# Patient Record
Sex: Female | Born: 1945
Health system: Southern US, Community
[De-identification: ages and names within clinical notes are randomized; demographics above are authoritative.]

## PROBLEM LIST (undated history)

## (undated) DIAGNOSIS — E7800 Pure hypercholesterolemia, unspecified: Secondary | ICD-10-CM

## (undated) DIAGNOSIS — R7309 Other abnormal glucose: Secondary | ICD-10-CM

## (undated) DIAGNOSIS — I10 Essential (primary) hypertension: Secondary | ICD-10-CM

## (undated) DIAGNOSIS — R0989 Other specified symptoms and signs involving the circulatory and respiratory systems: Secondary | ICD-10-CM

## (undated) DIAGNOSIS — T7840XA Allergy, unspecified, initial encounter: Secondary | ICD-10-CM

## (undated) DIAGNOSIS — G250 Essential tremor: Secondary | ICD-10-CM

## (undated) DIAGNOSIS — R7303 Prediabetes: Secondary | ICD-10-CM

## (undated) DIAGNOSIS — K219 Gastro-esophageal reflux disease without esophagitis: Secondary | ICD-10-CM

## (undated) DIAGNOSIS — E782 Mixed hyperlipidemia: Secondary | ICD-10-CM

## (undated) DIAGNOSIS — R011 Cardiac murmur, unspecified: Secondary | ICD-10-CM

## (undated) HISTORY — DX: Other specified symptoms and signs involving the circulatory and respiratory systems: R09.89

## (undated) HISTORY — DX: Allergy, unspecified, initial encounter: T78.40XA

## (undated) HISTORY — PX: COLONOSCOPY: SHX174

## (undated) HISTORY — DX: Prediabetes: R73.03

## (undated) HISTORY — DX: Mixed hyperlipidemia: E78.2

## (undated) HISTORY — PX: UPPER GASTROINTESTINAL ENDOSCOPY: SHX188

## (undated) HISTORY — DX: Other abnormal glucose: R73.09

## (undated) HISTORY — PX: BACK SURGERY: SHX140

---
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
## 1967-03-06 HISTORY — PX: WISDOM TOOTH EXTRACTION: SHX21

## 1997-12-13 ENCOUNTER — Encounter: Payer: Self-pay | Admitting: Gastroenterology

## 1997-12-13 ENCOUNTER — Ambulatory Visit (HOSPITAL_COMMUNITY): Admission: RE | Admit: 1997-12-13 | Discharge: 1997-12-13 | Payer: Self-pay | Admitting: Internal Medicine

## 1999-03-06 HISTORY — PX: BACK SURGERY: SHX140

## 1999-07-20 ENCOUNTER — Encounter: Payer: Self-pay | Admitting: Neurological Surgery

## 1999-07-24 ENCOUNTER — Encounter: Payer: Self-pay | Admitting: Neurological Surgery

## 1999-07-24 ENCOUNTER — Inpatient Hospital Stay (HOSPITAL_COMMUNITY): Admission: RE | Admit: 1999-07-24 | Discharge: 1999-07-25 | Payer: Self-pay | Admitting: Neurological Surgery

## 1999-08-11 ENCOUNTER — Encounter: Payer: Self-pay | Admitting: Neurological Surgery

## 1999-08-11 ENCOUNTER — Encounter: Admission: RE | Admit: 1999-08-11 | Discharge: 1999-08-11 | Payer: Self-pay | Admitting: Neurological Surgery

## 1999-10-18 ENCOUNTER — Encounter: Admission: RE | Admit: 1999-10-18 | Discharge: 1999-10-18 | Payer: Self-pay | Admitting: Neurological Surgery

## 1999-10-18 ENCOUNTER — Encounter: Payer: Self-pay | Admitting: Neurological Surgery

## 1999-10-24 ENCOUNTER — Encounter: Admission: RE | Admit: 1999-10-24 | Discharge: 1999-12-15 | Payer: Self-pay | Admitting: Neurological Surgery

## 1999-12-12 ENCOUNTER — Encounter: Payer: Self-pay | Admitting: Neurological Surgery

## 1999-12-12 ENCOUNTER — Encounter: Admission: RE | Admit: 1999-12-12 | Discharge: 1999-12-12 | Payer: Self-pay | Admitting: Neurological Surgery

## 1999-12-21 ENCOUNTER — Encounter: Admission: RE | Admit: 1999-12-21 | Discharge: 2000-01-15 | Payer: Self-pay | Admitting: Neurological Surgery

## 2000-03-06 ENCOUNTER — Encounter: Admission: RE | Admit: 2000-03-06 | Discharge: 2000-05-03 | Payer: Self-pay | Admitting: Neurological Surgery

## 2000-07-10 ENCOUNTER — Encounter: Admission: RE | Admit: 2000-07-10 | Discharge: 2000-07-10 | Payer: Self-pay | Admitting: Neurological Surgery

## 2000-07-10 ENCOUNTER — Encounter: Payer: Self-pay | Admitting: Neurological Surgery

## 2000-10-21 ENCOUNTER — Other Ambulatory Visit: Admission: RE | Admit: 2000-10-21 | Discharge: 2000-10-21 | Payer: Self-pay | Admitting: *Deleted

## 2000-11-05 ENCOUNTER — Encounter: Admission: RE | Admit: 2000-11-05 | Discharge: 2000-11-05 | Payer: Self-pay | Admitting: Neurology

## 2000-11-05 ENCOUNTER — Encounter: Payer: Self-pay | Admitting: Neurology

## 2000-11-12 ENCOUNTER — Encounter: Admission: RE | Admit: 2000-11-12 | Discharge: 2001-02-10 | Payer: Self-pay | Admitting: *Deleted

## 2000-11-27 ENCOUNTER — Encounter (INDEPENDENT_AMBULATORY_CARE_PROVIDER_SITE_OTHER): Payer: Self-pay | Admitting: Specialist

## 2000-11-27 ENCOUNTER — Other Ambulatory Visit: Admission: RE | Admit: 2000-11-27 | Discharge: 2000-11-27 | Payer: Self-pay | Admitting: Gastroenterology

## 2002-01-14 ENCOUNTER — Other Ambulatory Visit: Admission: RE | Admit: 2002-01-14 | Discharge: 2002-01-14 | Payer: Self-pay | Admitting: Internal Medicine

## 2002-03-04 ENCOUNTER — Encounter: Payer: Self-pay | Admitting: Internal Medicine

## 2002-03-04 ENCOUNTER — Ambulatory Visit (HOSPITAL_COMMUNITY): Admission: RE | Admit: 2002-03-04 | Discharge: 2002-03-04 | Payer: Self-pay | Admitting: Internal Medicine

## 2003-02-24 ENCOUNTER — Other Ambulatory Visit: Admission: RE | Admit: 2003-02-24 | Discharge: 2003-02-24 | Payer: Self-pay | Admitting: Obstetrics and Gynecology

## 2004-04-06 ENCOUNTER — Other Ambulatory Visit: Admission: RE | Admit: 2004-04-06 | Discharge: 2004-04-06 | Payer: Self-pay | Admitting: Internal Medicine

## 2005-05-23 ENCOUNTER — Ambulatory Visit (HOSPITAL_COMMUNITY): Admission: RE | Admit: 2005-05-23 | Discharge: 2005-05-23 | Payer: Self-pay | Admitting: Internal Medicine

## 2005-07-23 ENCOUNTER — Encounter: Admission: RE | Admit: 2005-07-23 | Discharge: 2005-07-23 | Payer: Self-pay | Admitting: Neurology

## 2006-05-30 ENCOUNTER — Other Ambulatory Visit: Admission: RE | Admit: 2006-05-30 | Discharge: 2006-05-30 | Payer: Self-pay | Admitting: Internal Medicine

## 2006-06-17 ENCOUNTER — Ambulatory Visit: Payer: Self-pay | Admitting: Gastroenterology

## 2006-07-25 ENCOUNTER — Ambulatory Visit: Payer: Self-pay | Admitting: Gastroenterology

## 2006-07-25 ENCOUNTER — Encounter (INDEPENDENT_AMBULATORY_CARE_PROVIDER_SITE_OTHER): Payer: Self-pay | Admitting: Gastroenterology

## 2006-11-21 ENCOUNTER — Ambulatory Visit (HOSPITAL_COMMUNITY): Admission: RE | Admit: 2006-11-21 | Discharge: 2006-11-21 | Payer: Self-pay | Admitting: Internal Medicine

## 2008-01-20 ENCOUNTER — Ambulatory Visit: Payer: Self-pay

## 2008-03-15 ENCOUNTER — Other Ambulatory Visit: Admission: RE | Admit: 2008-03-15 | Discharge: 2008-03-15 | Payer: Self-pay | Admitting: Internal Medicine

## 2008-04-08 ENCOUNTER — Ambulatory Visit (HOSPITAL_COMMUNITY): Admission: RE | Admit: 2008-04-08 | Discharge: 2008-04-08 | Payer: Self-pay | Admitting: Internal Medicine

## 2010-03-27 ENCOUNTER — Encounter: Payer: Self-pay | Admitting: Internal Medicine

## 2010-07-21 NOTE — H&P (Signed)
[name]  Patient:    Margaret Sullivan, Margaret Sullivan                       MRN: [id number] Adm. Date:  [id number] Attending:  Clearnce Sorrel Dictator:   Earleen Newport, M.D.                         History and Physical  ADMISSION DIAGNOSES: 1. Herniated nucleus pulposus, C6-7, left. 2. Spondylosis, C5-6, right, with left C7 radiculopathy.  HISTORY OF PRESENT ILLNESS:  The patient is a 65 year old right-handed individual who is a retired Education officer, [employer].  She has had previous problems with pain in the neck and shoulder and right arm over a years period of time, and has had pain in the left side of the neck that became much more severe and acute about 10 weeks ago.  The patient noted that in 1999 she had some tremors in the left hand, however, about 9 weeks ago she developed pain radiating down into the hands, tingling into the fingertips, but no overt numbness was also reported.  She finds that mobility of her neck and arms has been significantly limited since the onset of this ______ type of pain, and it comes to the point where it has interrupted her ability to sleep and rest comfortably.  She feels something further needs to be done.  An MRI demonstrated the presence of spondylitic disease, eccentric to the right side at C5-6, herniated nucleus pulposus eccentric to the left side compressing the left C7 nerve root.  She was seen in the office on May 2, and surgery was advised.  She is now admitted for a two level anterior diskectomy and arthrodesis, having significant signs of both the right and left cervical radiculopathy.  PAST MEDICAL HISTORY: 1. Tonsillectomy in the past. 2. Wisdom teeth extraction in 1968. 3. Birth mark removal in 1977. 4. Endoscopy in 1990 and again in 1999. 5. Colonoscopy in 1990 and again in 1999. 6. Bladder procedure was complicated by significant reactions to medications    and several bouts of pneumonia that the patient  feels are secondary to    aspiration.  This ultimately forced her retirement, and she was sick for a    prolonged period of time with pneumonias.  She also noted that she had    significant medical allergies during this period of time.  ALLERGIES:  PENICILLIN, AMPICILLIN, ASPIRIN, MACRODANTIN, PERCOCET, TALWIN, HYDROCODONE, AND PROPULSID.  She was started on Propulsid after it was diagnosed that she had a hiatal hernia.  She was given this medication for reflux, and had a severe reaction that ended up in a 911 call.  The patient currently only uses Tylenol for pain.  She has significant concerns about undergoing any anesthetic procedure because of history of aspiration, and notes that she has quite severe anxiety episodes with fear of what happens to her breathing and her swallowing at night.  She has used some Xanax for this in the past.  SOCIAL HISTORY:  She does not smoke, she does not drink alcohol.  Her height and weight have been stable at 160 pounds, 5 feet 3.5 inches.  She is divorced.  She has no children.  She is a retired Education officer, museum, having taught in the system for 30 years.  FAMILY HISTORY:  Her mother died at age 67.  Father died at age 33 of a history of hypertension,  heart attacks, cancer, and nerve damage as a result of a war injury in her father.  No significant history of tremor, except for perhaps a grandparent which was suspected to have been exposed to mustard gas during the first World War.  There is a history of some arthritic conditions in her mom.  REVIEW OF SYSTEMS:  Notable for the wearing of glasses, high blood pressure, heart murmur, high cholesterol, sweating in the feet and hands, sinus problems, arm weakness, back pain, arm pain, joint pain and swelling, neck pain, and a history of pneumonias as previously noted.  PHYSICAL EXAMINATION:  GENERAL:  She is an alert, oriented, and cooperative individual in no overt distress.  NECK:  Range of  motion of her neck reveals that she turns to the right 60 degrees and turns to the left to 60 degrees.  She extends and flexes normally, and extends and flexes with significant pain.  EXTREMITIES:  Her motor strength in the upper extremities reveals the deltoids, biceps, and intrinsics have normal strength, tone, and bulk. Triceps and wrist extensor on the left side is markedly weak at 3/5 in the triceps and 4/5 in the finger extensors.  Sensation is intact to vibration in the distal upper extremities.  There is tenderness to palpation in the supraclavicular fossa worse on the left than on the right.  No masses are palpable in the neck.  No bruits are heard in the neck, however, grade 1/6 systolic murmur is heard at the base from her heart.  NEUROLOGIC:  Cranial nerve examination reveals the pupils are 4 mm, brisk, reactive to light and accommodation.  Extraocular movements are full.  The face is symmetric to grimmace.  Tongue and uvula are in the midline.  Station and gait are normal.  Fund of knowledge and flow of thoughts is normal.  LUNGS:  Clear to auscultation.  ABDOMEN:  Soft, protuberant, bowel sounds positive, no masses are noted.  EXTREMITIES:  No clubbing, cyanosis, or edema.  The patient has a herniated nucleus pulposus at C6-7 on the left side, and spondylitic disease that is most marked on the C5-6 side with evidence of cord compression.  PLAN:  She is now being admitted to undergo surgical decompression at the two levels. DD:  07/24/99 TD:  07/24/99 Job: [id number]

## 2010-07-21 NOTE — Op Note (Signed)
[place]. [facility]  Patient:    Margaret Sullivan, Margaret Sullivan                       MRN: [id number] Proc. Date: 07/24/99 Adm. Date:  [id number] Disc. Date: [id number] Attending:  Clearnce Sorrel                           Operative Report  PREOPERATIVE DIAGNOSIS:   C5-6 spondylosis with right cervical radiculopathy, C6-7 herniated nucleus pulposus with left cervical radiculopathy.  POSTOPERATIVE DIAGNOSIS:  C5-6 spondylosis with right cervical radiculopathy, C6-7 herniated nucleus pulposus with left cervical radiculopathy.  PROCEDURE:  Anterior cervical diskectomy and arthrodesis, C5-6 and C6-7.  SURGEON:  Earleen Newport, M.D.  FIRST ASSISTANT:  Madelon Lips. Quentin Cornwall, M.D.  ANESTHESIA:  General endotracheal.  INDICATIONS:  The patient is a 65 year old individual who has had significant neck, shoulder, and arm pain on the left side more acutely, on the right side chronically.  Spondylosis is noted at C5-6 on the right, and there are early signs of cord compression, and there is a herniated nucleus pulposus at C6-7 on the left.  DESCRIPTION OF PROCEDURE:  The patient was brought to the operating room and placed on the table in supine position.  After smooth induction of general endotracheal anesthesia, she was placed in five pounds of halter traction. The neck was shaved, prepped with Duraprep, and draped in a sterile fashion. A transverse incision was made in the left side of the neck and carried down through the platysma.  The plane between the sternocleidomastoid and the strap muscles was dissected bluntly until the prevertebral space was reached.  The first identifiable disk space was noted to be that of C5-6 on the radiograph. A diskectomy was then performed after placing a Caspar retractor and using a combination of curets and rongeurs, the disk space was opened.  Self-retaining disk spreader was placed in the wound, then a Midas Rex and A2 bur was used  to remove spurs from the inferior margin of the body of C5 and out to and including the uncinate process on the right.  Significant osteophytic material was encountered at C5-6.  This was decompressed.  Both lateral recesses were well-decompressed.  Hemostasis was achieved from the epidural veins in the area using a bipolar cautery and then some small pledgets of Gelfoam soaked in thrombin.  A round fibular graft was then packed with some of the bone that was removed from the osteophytes and placed into the interspace at C5-6.  At C6-7, a similar procedure was carried out.  Again a diskectomy was performed. Here a large lateral disk herniation in the free lateral space of C6-7 on the left was encountered.  Once this was resected and removed, the area was well-decompressed and attention was then turned to placing a graft, again a 7 mm round fibular graft packed with some of the patients own bone.  Traction was then removed.  The neck was placed in slight flexion.  The surfaces were instrumented with a 37 mm Synthes multi-angle plate with six locking 4 x 14 mm self-drilling, self-tapping screws.  Locking screws were placed.  The area was then checked for hemostasis and a localizing radiograph identified good position of the plate.  Once hemostasis was achieved, the platysma was closed with 2-0 Vicryl in interrupted fashion, 3-0 Vicryl was used in the subcuticular tissues.  A Tegaderm dressing was placed on  the patients wound. The patient tolerated the procedure well and was returned to the recovery room in stable condition. DD:  07/24/99 TD:  07/28/99 Job: [id number]

## 2010-07-21 NOTE — Discharge Summary (Signed)
[place]. [facility]  Patient:    Margaret Sullivan, Margaret Sullivan                       MRN: [id number] Adm. Date:  [id number] Disc. Date: [id number] Attending:  Clearnce Sorrel                           Discharge Summary  ADMITTING DIAGNOSIS:  Cervical spondylosis, C5-6, right, with right cervical radiculopathy, C6-7 left, with left cervical radiculopathy.  DISCHARGE DIAGNOSIS:  Cervical spondylosis, C5-6, right, with right cervical radiculopathy, C6-7 left, with left cervical radiculopathy.  OPERATION:  Anterior cervical diskectomy and fusion, C5-6 and C6-7, with allograft and Synthes fixation.  CONDITION ON DISCHARGE:  Improving.  HOSPITAL COURSE:  The patient is a 65 year old individual who has a number of known medical allergies.  She has had significant radiculopathy particularly in the left upper extremity secondary to a herniated nucleus pulposus at C6-7, but also chronic right C6 radiculopathy secondary to spondylosis.  She underwent anterior diskectomy and arthrodesis.  She tolerated the procedure well.  Her swelling has been uninvolved since the time of surgery and her breathing has been stable.  Patient had significant concerns because of a number of medical allergies.  She was premedicated with some Decadron which caused her to feel somewhat hyperactive during the postoperative period.  Her incision remains clean and dry and she has been advised as to her postoperative activity.  She is given a prescription for Xanax #30 for anxiety, 0.5 mg, without refills.  She will use Tylenol for pain. DD:  07/25/99 TD:  07/27/99 Job: [id number]

## 2011-05-02 ENCOUNTER — Other Ambulatory Visit: Payer: Self-pay | Admitting: Internal Medicine

## 2011-05-10 ENCOUNTER — Ambulatory Visit
Admission: RE | Admit: 2011-05-10 | Discharge: 2011-05-10 | Disposition: A | Discharge status: Home / Self Care | Payer: Medicare Other | Source: Ambulatory Visit | Attending: Internal Medicine | Admitting: Internal Medicine

## 2011-05-14 ENCOUNTER — Encounter: Payer: Self-pay | Admitting: Gastroenterology

## 2012-01-04 ENCOUNTER — Ambulatory Visit
Admission: RE | Admit: 2012-01-04 | Discharge: 2012-01-04 | Disposition: A | Discharge status: Home / Self Care | Payer: Medicare Other | Source: Ambulatory Visit | Attending: Family Medicine | Admitting: Family Medicine

## 2012-01-04 ENCOUNTER — Other Ambulatory Visit: Payer: Self-pay | Admitting: Family Medicine

## 2012-01-04 DIAGNOSIS — E049 Nontoxic goiter, unspecified: Secondary | ICD-10-CM

## 2012-06-10 ENCOUNTER — Encounter: Payer: Self-pay | Admitting: Gastroenterology

## 2012-12-16 ENCOUNTER — Other Ambulatory Visit (HOSPITAL_COMMUNITY): Payer: Self-pay | Admitting: Internal Medicine

## 2012-12-18 ENCOUNTER — Other Ambulatory Visit (HOSPITAL_COMMUNITY): Payer: Self-pay | Admitting: Internal Medicine

## 2012-12-18 DIAGNOSIS — K8020 Calculus of gallbladder without cholecystitis without obstruction: Secondary | ICD-10-CM

## 2012-12-31 ENCOUNTER — Encounter (HOSPITAL_COMMUNITY): Payer: Medicare Other

## 2013-06-09 ENCOUNTER — Encounter (HOSPITAL_COMMUNITY): Payer: Self-pay | Admitting: Emergency Medicine

## 2013-06-09 ENCOUNTER — Emergency Department (HOSPITAL_COMMUNITY)
Admission: EM | Admit: 2013-06-09 | Discharge: 2013-06-09 | Disposition: A | Discharge status: Home / Self Care | Payer: Medicare Other | Attending: Emergency Medicine | Admitting: Emergency Medicine

## 2013-06-09 DIAGNOSIS — R0989 Other specified symptoms and signs involving the circulatory and respiratory systems: Secondary | ICD-10-CM | POA: Insufficient documentation

## 2013-06-09 DIAGNOSIS — Z862 Personal history of diseases of the blood and blood-forming organs and certain disorders involving the immune mechanism: Secondary | ICD-10-CM | POA: Insufficient documentation

## 2013-06-09 DIAGNOSIS — K2100 Gastro-esophageal reflux disease with esophagitis, without bleeding: Secondary | ICD-10-CM | POA: Insufficient documentation

## 2013-06-09 DIAGNOSIS — Z8639 Personal history of other endocrine, nutritional and metabolic disease: Secondary | ICD-10-CM | POA: Insufficient documentation

## 2013-06-09 DIAGNOSIS — Z79899 Other long term (current) drug therapy: Secondary | ICD-10-CM | POA: Insufficient documentation

## 2013-06-09 DIAGNOSIS — Z8669 Personal history of other diseases of the nervous system and sense organs: Secondary | ICD-10-CM | POA: Insufficient documentation

## 2013-06-09 DIAGNOSIS — R7309 Other abnormal glucose: Secondary | ICD-10-CM | POA: Insufficient documentation

## 2013-06-09 DIAGNOSIS — Z880 Allergy status to penicillin: Secondary | ICD-10-CM | POA: Insufficient documentation

## 2013-06-09 DIAGNOSIS — I10 Essential (primary) hypertension: Secondary | ICD-10-CM | POA: Insufficient documentation

## 2013-06-09 DIAGNOSIS — E782 Mixed hyperlipidemia: Secondary | ICD-10-CM | POA: Insufficient documentation

## 2013-06-09 DIAGNOSIS — K047 Periapical abscess without sinus: Secondary | ICD-10-CM

## 2013-06-09 DIAGNOSIS — R011 Cardiac murmur, unspecified: Secondary | ICD-10-CM | POA: Insufficient documentation

## 2013-06-09 HISTORY — DX: Gastro-esophageal reflux disease without esophagitis: K21.9

## 2013-06-09 HISTORY — DX: Essential (primary) hypertension: I10

## 2013-06-09 HISTORY — DX: Pure hypercholesterolemia, unspecified: E78.00

## 2013-06-09 HISTORY — DX: Essential tremor: G25.0

## 2013-06-09 HISTORY — DX: Cardiac murmur, unspecified: R01.1

## 2013-06-09 MED ORDER — HYDROCODONE-ACETAMINOPHEN 5-325 MG PO TABS: 1.0000 | ORAL_TABLET | Freq: Four times a day (QID) | ORAL | Status: DC | PRN

## 2013-06-09 MED ORDER — ONDANSETRON HCL 4 MG PO TABS: 4.0000 mg | ORAL_TABLET | Freq: Four times a day (QID) | ORAL | Status: DC

## 2013-06-09 MED ORDER — ONDANSETRON HCL 4 MG/2ML IJ SOLN
4.0000 mg | Freq: Once | INTRAMUSCULAR | Status: AC
  Administered 2013-06-09: 4 mg via INTRAVENOUS
  Filled 2013-06-09 (×2): qty 2

## 2013-06-09 MED ORDER — ONDANSETRON HCL 4 MG/2ML IJ SOLN
4.0000 mg | Freq: Once | INTRAMUSCULAR | Status: AC
  Administered 2013-06-09: 4 mg via INTRAVENOUS
  Filled 2013-06-09: qty 2

## 2013-06-09 MED ORDER — CLINDAMYCIN PHOSPHATE 600 MG/50ML IV SOLN
600.0000 mg | Freq: Once | INTRAVENOUS | Status: AC
  Administered 2013-06-09: 600 mg via INTRAVENOUS
  Filled 2013-06-09: qty 50

## 2013-06-09 MED ORDER — FENTANYL CITRATE 0.05 MG/ML IJ SOLN
50.0000 ug | INTRAMUSCULAR | Status: DC | PRN
  Administered 2013-06-09 (×2): 50 ug via INTRAVENOUS
  Filled 2013-06-09 (×2): qty 2

## 2013-06-09 NOTE — ED Provider Notes (Signed)
CSN: [id number]     Arrival date & time 06/09/13  0134 History   First MD Initiated Contact with Patient 06/09/13 0503     Chief Complaint  Patient presents with  . Dental Pain     (Consider location/radiation/quality/duration/timing/severity/associated sxs/prior Treatment) HPI History provided by patient. Right upper molar pain and facial swelling. Onset a few days ago, saw her endodontist in the office for planned root canal R first upper molar. she was diagnosed with dental abscess. In the office, she was unable to tolerate lidocaine injection - she was shaking too much and had to reschedule procedure. She was discharged home with clindamycin. Today she developed worsening pain with facial swelling. No fevers or chills. No nausea vomiting. No difficulty swallowing or breathing. Taking Tylenol at home without relief.  Past Medical History  Diagnosis Date  . Hypertension   . GERD (gastroesophageal reflux disease)   . Essential tremor   . High cholesterol   . Heart murmur    Past Surgical History  Procedure Laterality Date  . Back surgery     No family history on file. History  Substance Use Topics  . Smoking status: Never Smoker   . Smokeless tobacco: Not on file  . Alcohol Use: No   OB History   Grav Para Term Preterm Abortions TAB SAB Ect Mult Living                 Review of Systems  Constitutional: Negative for fever and chills.  HENT: Positive for dental problem and facial swelling.   Respiratory: Negative for shortness of breath.   Cardiovascular: Negative for chest pain.  Gastrointestinal: Negative for abdominal pain.  Genitourinary: Negative for dysuria.  Musculoskeletal: Negative for back pain, neck pain and neck stiffness.  Skin: Negative for rash.  Neurological: Negative for headaches.  All other systems reviewed and are negative.      Allergies  Ampicillin; Aspirin; Doxycycline; Macrodantin; Propulsid; Voltaren; Codeine; and Penicillins  Home  Medications   Current Outpatient Rx  Name  Route  Sig  Dispense  Refill  . Cholecalciferol (VITAMIN D PO)   Oral   Take 1 tablet by mouth daily.         . Probiotic Product (PROBIOTIC PO)   Oral   Take 1 capsule by mouth 4 (four) times a week. Naturlen.         . clindamycin (CLEOCIN) 150 MG capsule   Oral   Take 150 mg by mouth every 6 (six) hours. 7 day therapy-Patient began on 06/07/2013.          BP 187/77  Pulse 96  Temp(Src) 98.8 F (37.1 C) (Oral)  SpO2 98% Physical Exam  Constitutional: She is oriented to person, place, and time. She appears well-developed and well-nourished.  HENT:  Head: Normocephalic and atraumatic.  Tender right upper first molar with associated facial swelling, erythema and increased warmth to touch. Mild trismus. Uvula midline.  Eyes: EOM are normal. Pupils are equal, round, and reactive to light.  Neck: Normal range of motion. Neck supple.  Cardiovascular: Regular rhythm and intact distal pulses.   Pulmonary/Chest: Effort normal. No stridor. No respiratory distress.  Musculoskeletal: Normal range of motion. She exhibits no edema.  Lymphadenopathy:    She has no cervical adenopathy.  Neurological: She is alert and oriented to person, place, and time.  Skin: Skin is warm and dry.    ED Course  Procedures (including critical care time)   Room air pulse ox 95%  is adequate  IV clindamycin IV fentanyl. Zofran.  On recheck her pain is significantly improved and she feels comfortable with plan discharge home. She will continue clindamycin as prescribed by her endodontist and keep her scheduled followup for her root canal. She will try prescription pain medications and antiemetic. She agrees to return precautions and all discharge and followup instructions. MDM   Final diagnoses:  Dental abscess   Treated with IV narcotics and IV antibiotics. Condition improving. No fever or airway compromise. No indication for emergent imaging. Vital  signs nursing notes reviewed and considered.    Teressa Lower, MD 06/10/13 [phone]

## 2013-06-09 NOTE — Discharge Instructions (Signed)
°  Dental Abscess A dental abscess is a collection of infected fluid (pus) from a bacterial infection in the inner part of the tooth (pulp). It usually occurs at the end of the tooth's root.  CAUSES   Severe tooth decay.  Trauma to the tooth that allows bacteria to enter into the pulp, such as a broken or chipped tooth. SYMPTOMS   Severe pain in and around the infected tooth.  Swelling and redness around the abscessed tooth or in the mouth or face.  Tenderness.  Pus drainage.  Bad breath.  Bitter taste in the mouth.  Difficulty swallowing.  Difficulty opening the mouth.  Nausea.  Vomiting.  Chills.  Swollen neck glands. DIAGNOSIS   A medical and dental history will be taken.  An examination will be performed by tapping on the abscessed tooth.  X-rays may be taken of the tooth to identify the abscess. TREATMENT The goal of treatment is to eliminate the infection. You may be prescribed antibiotic medicine to stop the infection from spreading. A root canal may be performed to save the tooth. If the tooth cannot be saved, it may be pulled (extracted) and the abscess may be drained.  HOME CARE INSTRUCTIONS  Only take over-the-counter or prescription medicines for pain, fever, or discomfort as directed by your caregiver.  Rinse your mouth (gargle) often with salt water ( tsp salt in 8 oz [250 ml] of warm water) to relieve pain or swelling.  Do not drive after taking pain medicine (narcotics).  Do not apply heat to the outside of your face.  Return to your dentist for further treatment as directed. SEEK MEDICAL CARE IF:  Your pain is not helped by medicine.  Your pain is getting worse instead of better. SEEK IMMEDIATE MEDICAL CARE IF:  You have a fever or persistent symptoms for more than 2 3 days.  You have a fever and your symptoms suddenly get worse.  You have chills or a very bad headache.  You have problems breathing or swallowing.  You have trouble  opening your mouth.  You have swelling in the neck or around the eye. Document Released: 02/19/2005 Document Revised: 11/14/2011 Document Reviewed: 05/30/2010 [facility] Patient Information 2014 [place], [place].

## 2013-06-09 NOTE — ED Notes (Signed)
Bed: WA20 Expected date:  Expected time:  Means of arrival:  Comments: 

## 2013-06-09 NOTE — ED Notes (Signed)
Pt states that she had dental pain, scheduled a root canal w/ her dentist and had to reschedule bc the epinephrine made her shake too much and her BP was too high. Taking antibiotics and tylenol for pain but tonight her R side of her face is swollen, her BP is high and she is having increased pain.

## 2013-06-10 ENCOUNTER — Ambulatory Visit (INDEPENDENT_AMBULATORY_CARE_PROVIDER_SITE_OTHER): Payer: Medicare Other | Admitting: Internal Medicine

## 2013-06-10 ENCOUNTER — Encounter: Payer: Self-pay | Admitting: Internal Medicine

## 2013-06-10 VITALS — BP 128/66 | HR 96 | Temp 97.2°F | Resp 16 | Ht 64.0 in | Wt 163.2 lb

## 2013-06-10 DIAGNOSIS — R7303 Prediabetes: Secondary | ICD-10-CM

## 2013-06-10 DIAGNOSIS — I10 Essential (primary) hypertension: Secondary | ICD-10-CM

## 2013-06-10 DIAGNOSIS — Z79899 Other long term (current) drug therapy: Secondary | ICD-10-CM | POA: Insufficient documentation

## 2013-06-10 DIAGNOSIS — R0989 Other specified symptoms and signs involving the circulatory and respiratory systems: Secondary | ICD-10-CM

## 2013-06-10 DIAGNOSIS — E559 Vitamin D deficiency, unspecified: Secondary | ICD-10-CM

## 2013-06-10 DIAGNOSIS — R7309 Other abnormal glucose: Secondary | ICD-10-CM

## 2013-06-10 DIAGNOSIS — E782 Mixed hyperlipidemia: Secondary | ICD-10-CM

## 2013-06-10 HISTORY — DX: Other long term (current) drug therapy: Z79.899

## 2013-06-10 LAB — HEPATIC FUNCTION PANEL
ALBUMIN: 4.5 g/dL (ref 3.5–5.2)
ALK PHOS: 68 U/L (ref 39–117)
ALT: 25 U/L (ref 0–35)
AST: 28 U/L (ref 0–37)
Bilirubin, Direct: 0.2 mg/dL (ref 0.0–0.3)
Indirect Bilirubin: 0.6 mg/dL (ref 0.2–1.2)
TOTAL PROTEIN: 7.4 g/dL (ref 6.0–8.3)
Total Bilirubin: 0.8 mg/dL (ref 0.2–1.2)

## 2013-06-10 LAB — CBC WITH DIFFERENTIAL/PLATELET
Basophils Absolute: 0 10*3/uL (ref 0.0–0.1)
Basophils Relative: 0 % (ref 0–1)
EOS PCT: 0 % (ref 0–5)
Eosinophils Absolute: 0 10*3/uL (ref 0.0–0.7)
HCT: 39.2 % (ref 36.0–46.0)
Hemoglobin: 13.6 g/dL (ref 12.0–15.0)
LYMPHS ABS: 1.3 10*3/uL (ref 0.7–4.0)
LYMPHS PCT: 10 % — AB (ref 12–46)
MCH: 30.6 pg (ref 26.0–34.0)
MCHC: 34.7 g/dL (ref 30.0–36.0)
MCV: 88.3 fL (ref 78.0–100.0)
Monocytes Absolute: 1.3 10*3/uL — ABNORMAL HIGH (ref 0.1–1.0)
Monocytes Relative: 10 % (ref 3–12)
Neutro Abs: 10.2 10*3/uL — ABNORMAL HIGH (ref 1.7–7.7)
Neutrophils Relative %: 80 % — ABNORMAL HIGH (ref 43–77)
Platelets: 204 10*3/uL (ref 150–400)
RBC: 4.44 MIL/uL (ref 3.87–5.11)
RDW: 13.1 % (ref 11.5–15.5)
WBC: 12.7 10*3/uL — AB (ref 4.0–10.5)

## 2013-06-10 MED ORDER — ALPRAZOLAM 0.25 MG PO TABS: ORAL_TABLET | ORAL | Status: DC

## 2013-06-10 NOTE — Patient Instructions (Signed)

## 2013-06-10 NOTE — Progress Notes (Signed)
Patient ID: Margaret Sullivan, female   DOB: [birth year], 68 y.o.   MRN: [id number]    This very nice 68 y.o. DWF presents for 3 month follow up with Hypertension, Hyperlipidemia, Pre-Diabetes and Vitamin D Deficiency.    Labile HTN predates many years. BP has been note moderately elevated in the range 200-210/ 100/110 associated with a dental abcess and monitored at home and at her dental office. Today's BP: 128/66 mmHg . Patient denies any cardiac type chest pain, palpitations, dyspnea/orthopnea/PND, dizziness, claudication, or dependent edema.   Hyperlipidemia is not controlled with diet & meds. Last Cholesterol was 251, Triglycerides were 188, HDL  36 and LDL 177 - not at goal and patient is adverse to taking meds for cholesterol despite recommendations otherwise. Patient denies myalgias or other med SE's.    Also, the patient has history of PreDiabetes with A1c 6.1% in Feb 2013 and with last A1c of 5.8% in Oct 2014. Patient denies any symptoms of reactive hypoglycemia, diabetic polys, paresthesias or visual blurring.   Further, Patient has history of Vitamin D Deficiency with last vitamin D of 32 in Oct 2014. Patient partially supplements vitamin D without any suspected side-effects.  Medication Sig  . Cholecalciferol (VITAMIN D PO) ? dose Take 1 tablet by mouth daily.  . clindamycin (CLEOCIN) 150 MG capsule Take 150 mg by mouth every 6 (six) hours. 7 day therapy-Patient began on 06/07/2013.  Marland Kitchen NORCO 5-325 MG per tablet Take 1 tablet by mouth every 6 (six) hours as needed.  . ondansetron (ZOFRAN) 4 MG tablet Take 1 tablet (4 mg total) by mouth every 6 (six) hours.  . Probiotic Product (PROBIOTIC PO) Take 1 capsule by mouth 4 (four) times a week. Naturlen.      Allergies  Allergen Reactions  . Ampicillin Hives and Swelling  . Aspirin Itching  . Doxycycline Itching and Swelling  . Macrodantin [Nitrofurantoin Macrocrystal] Hives, Itching and Swelling  . Propulsid [Cisapride] Itching and  Swelling  . Voltaren [Diclofenac] Hives and Itching  . Codeine Palpitations  . Penicillins Swelling and Rash    PMHx:   Past Medical History  Diagnosis Date  . Essential tremor   . GERD (gastroesophageal reflux disease)   . Heart murmur   . High cholesterol   . Hypertension     labile  . Prediabetes   . Labile hypertension   . Hyperlipidemia     FHx:    Reviewed / unchanged  SHx:    Reviewed / unchanged   Systems Review: Constitutional: Denies fever, chills, wt changes, headaches, insomnia, fatigue, night sweats, change in appetite. Eyes: Denies redness, blurred vision, diplopia, discharge, itchy, watery eyes.  ENT: Denies discharge, congestion, post nasal drip, epistaxis, sore throat, earache, hearing loss, dental pain, tinnitus, vertigo, sinus pain, snoring.  CV: Denies chest pain, palpitations, irregular heartbeat, syncope, dyspnea, diaphoresis, orthopnea, PND, claudication, edema. Respiratory: denies cough, dyspnea, DOE, pleurisy, hoarseness, laryngitis, wheezing.  Gastrointestinal: Denies dysphagia, odynophagia, heartburn, reflux, water brash, abdominal pain or cramps, nausea, vomiting, bloating, diarrhea, constipation, hematemesis, melena, hematochezia,  or hemorrhoids. Genitourinary: Denies dysuria, frequency, urgency, nocturia, hesitancy, discharge, hematuria, flank pain. Musculoskeletal: Denies arthralgias, myalgias, stiffness, jt. swelling, pain, limp, strain/sprain.  Skin: Denies pruritus, rash, hives, warts, acne, eczema, change in skin lesion(s). Neuro: No weakness, tremor, incoordination, spasms, paresthesia, or pain. Psychiatric: Denies confusion, memory loss, or sensory loss. Endo: Denies change in weight, skin, hair change.  Heme/Lymph: No excessive bleeding, bruising, orenlarged lymph nodes.   Exam:  BP 128/66  Pulse 96  Temp97.2 F   Resp 16  Ht 5' 4"    Wt 163 lb 3.2 oz   BMI 28.00 kg/m2  Appears well nourished - in no distress. Eyes: PERRLA,  EOMs, conjunctiva no swelling or erythema. Sinuses: No frontal/maxillary tenderness ENT/Mouth: EAC's clear, TM's nl w/o erythema, bulging. Nares clear w/o erythema, swelling, exudates. Oropharynx clear without erythema or exudates. Oral hygiene is good. Tongue normal, non obstructing. Hearing intact.  Neck: Supple. Thyroid nl. Car 2+/2+ without bruits, nodes or JVD. Chest: Respirations nl with BS clear & equal w/o rales, rhonchi, wheezing or stridor.  Cor: Heart sounds normal w/ regular rate and rhythm without sig. murmurs, gallops, clicks, or rubs. Peripheral pulses normal and equal  without edema.  Abdomen: Soft & bowel sounds normal. Non-tender w/o guarding, rebound, hernias, masses, or organomegaly.  Lymphatics: Unremarkable.  Musculoskeletal: Full ROM all peripheral extremities, joint stability, 5/5 strength, and normal gait.  Skin: Warm, dry without exposed rashes, lesions, ecchymosis apparent.  Neuro: Cranial nerves intact, reflexes equal bilaterally. Sensory-motor testing grossly intact. Tendon reflexes grossly intact.  Pysch: Alert & oriented x 3. Insight and judgement nl & appropriate. No ideations.  Assessment and Plan:  1. Labile Hypertension - Continue monitor blood pressure at home. Continue diet/meds same.  2. Hyperlipidemia - Continue diet/meds, exercise,& lifestyle modifications. Continue monitor periodic cholesterol/liver & renal functions   3. Pre-diabetes/Insulin Resistance - Continue diet, exercise, lifestyle modifications. Monitor appropriate labs.  4. Vitamin D Deficiency - Continue supplementation.  Recommended regular exercise, BP monitoring, weight control, and discussed med and SE's. Recommended labs to assess and monitor clinical status. Further disposition pending results of labs. Patient has a great deal of anxiety re: upcoming Dental work and as BP is normal today - discussed plan and she is agreeable to taking short term low dose alprazolam and continuing to  monitor BP's closely.

## 2013-06-11 LAB — BASIC METABOLIC PANEL WITH GFR
BUN: 17 mg/dL (ref 6–23)
CHLORIDE: 96 meq/L (ref 96–112)
CO2: 26 meq/L (ref 19–32)
CREATININE: 0.87 mg/dL (ref 0.50–1.10)
Calcium: 9.3 mg/dL (ref 8.4–10.5)
GFR, Est African American: 80 mL/min
GFR, Est Non African American: 69 mL/min
Glucose, Bld: 97 mg/dL (ref 70–99)
Potassium: 3.8 mEq/L (ref 3.5–5.3)
Sodium: 132 mEq/L — ABNORMAL LOW (ref 135–145)

## 2013-06-11 LAB — LIPID PANEL
Cholesterol: 172 mg/dL (ref 0–200)
HDL: 33 mg/dL — ABNORMAL LOW (ref >39)
LDL CALC: 114 mg/dL — AB (ref 0–99)
Total CHOL/HDL Ratio: 5.2 Ratio
Triglycerides: 127 mg/dL (ref <150)
VLDL: 25 mg/dL (ref 0–40)

## 2013-06-11 LAB — TSH: TSH: 1.986 u[IU]/mL (ref 0.350–4.500)

## 2013-06-11 LAB — INSULIN, FASTING: INSULIN FASTING, SERUM: 30 u[IU]/mL — AB (ref 3–28)

## 2013-06-11 LAB — HEMOGLOBIN A1C
Hgb A1c MFr Bld: 5.5 % (ref <5.7)
MEAN PLASMA GLUCOSE: 111 mg/dL (ref <117)

## 2013-06-11 LAB — VITAMIN D 25 HYDROXY (VIT D DEFICIENCY, FRACTURES): Vit D, 25-Hydroxy: 32 ng/mL (ref 30–89)

## 2013-06-11 LAB — MAGNESIUM: Magnesium: 2 mg/dL (ref 1.5–2.5)

## 2013-06-24 ENCOUNTER — Ambulatory Visit: Payer: Self-pay | Admitting: Internal Medicine

## 2013-07-01 ENCOUNTER — Encounter: Payer: Self-pay | Admitting: Internal Medicine

## 2013-07-01 ENCOUNTER — Ambulatory Visit (INDEPENDENT_AMBULATORY_CARE_PROVIDER_SITE_OTHER): Payer: Medicare Other | Admitting: Internal Medicine

## 2013-07-01 VITALS — BP 136/74 | HR 76 | Temp 97.5°F | Resp 16 | Ht 64.0 in | Wt 160.0 lb

## 2013-07-01 DIAGNOSIS — R030 Elevated blood-pressure reading, without diagnosis of hypertension: Secondary | ICD-10-CM

## 2013-07-01 NOTE — Progress Notes (Signed)
   Subjective:    Patient ID: Margaret Sullivan, female    DOB: Jan 11, 1946, 68 y.o.   MRN: [id number]  HPI Patient is seen in 2 week F/U of an episode of moderately severely elevated BP associated with a dental appt. In the office here her BP had returned to normal and in discussion of all of her current stressors , we decided to try to manage her anxiety with low dose  Alprazolam of which she has been taking 1/2 tab of .25 mg tabs of Alprazolam.. DShe reports BP's have ranged less than 136/70's.   Current Outpatient Prescriptions on File Prior to Visit  Medication Sig Dispense Refill  . Acetaminophen (TYLENOL EXTRA STRENGTH PO) Take by mouth. PRN      . ALPRAZolam (XANAX) 0.25 MG tablet Take 1/2 to 1 tablet 3 x daily as need for anxiety  90 tablet  5  . Cholecalciferol (VITAMIN D PO) Take 2,000 Units by mouth daily.       . Probiotic Product (PROBIOTIC PO) Take 1 capsule by mouth 4 (four) times a week. Naturlen.       No current facility-administered medications on file prior to visit.   Allergies  Allergen Reactions  . Ampicillin Hives and Swelling  . Aspirin Itching  . Doxycycline Itching and Swelling  . Macrodantin [Nitrofurantoin Macrocrystal] Hives, Itching and Swelling  . Percocet [Oxycodone-Acetaminophen]   . Propulsid [Cisapride] Itching and Swelling  . Voltaren [Diclofenac] Hives and Itching  . Codeine Palpitations  . Penicillins Swelling and Rash   Past Medical History  Diagnosis Date  . Essential tremor   . GERD (gastroesophageal reflux disease)   . Heart murmur   . High cholesterol   . Hypertension     labile  . Prediabetes   . Labile hypertension   . Hyperlipidemia    Review of Systems  In addition to the HPI above,  No Fever-chills,  No Headache, No changes with Vision or hearing,  No problems swallowing food or Liquids,  No Chest pain or productive Cough or Shortness of Breath,  No Abdominal pain, No Nausea or Vommitting, Bowel movements are regular,  No  Blood in stool or Urine,  No dysuria,  No new skin rashes or bruises,  No new joints pains-aches,  No new weakness, tingling, numbness in any extremity,  No recent weight loss,  No polyuria, polydypsia or polyphagia,  No significant Mental Stressors.  A full 10 point Review of Systems was done, except as stated above, all other Review of Systems were negative  Objective:   Physical Exam  BP 136/74  Pulse 76  Temp(Src) 97.5 F (36.4 C) (Temporal)  Resp 16  Ht 5' 4"  (1.626 m)  Wt 160 lb (72.576 kg)  BMI 27.45 kg/m2  HEENT - Eac's patent. TM's Nl.EOM's full. PERRLA. NasoOroPharynx clear. Neck - supple. Nl Thyroid. No bruits nodes JVD Chest - Clear equal BS Cor - Nl HS. RRR w/o sig MGR. PP 1(+) No edema. Abd - No palpable organomegaly, masses or tenderness. BS nl. MS- FROM. w/o deformities. Muscle power tone and bulk Nl. Gait Nl. Neuro - No obvious Cr N abnormalities. Sensory, motor and Cerebellar functions appear Nl w/o focal abnormalities.  Assessment & Plan:   1. Elevated blood pressure reading without diagnosis of hypertension  - Long discussion about continued BP monitoring and proper dit recommending the book by Dr Excell Seltzer "The End of Dieting"

## 2013-07-24 ENCOUNTER — Ambulatory Visit (INDEPENDENT_AMBULATORY_CARE_PROVIDER_SITE_OTHER): Payer: Medicare Other | Admitting: Emergency Medicine

## 2013-07-24 ENCOUNTER — Encounter: Payer: Self-pay | Admitting: Emergency Medicine

## 2013-07-24 VITALS — BP 154/78 | HR 92 | Temp 98.2°F | Resp 18 | Ht 64.0 in | Wt 157.0 lb

## 2013-07-24 DIAGNOSIS — J329 Chronic sinusitis, unspecified: Secondary | ICD-10-CM

## 2013-07-24 DIAGNOSIS — R05 Cough: Secondary | ICD-10-CM

## 2013-07-24 DIAGNOSIS — R059 Cough, unspecified: Secondary | ICD-10-CM

## 2013-07-24 MED ORDER — PREDNISONE 10 MG PO TABS: ORAL_TABLET | ORAL | Status: DC

## 2013-07-24 MED ORDER — AZITHROMYCIN 250 MG PO TABS: ORAL_TABLET | ORAL | Status: AC

## 2013-07-24 MED ORDER — BENZONATATE 100 MG PO CAPS: 100.0000 mg | ORAL_CAPSULE | Freq: Three times a day (TID) | ORAL | Status: DC | PRN

## 2013-07-24 MED ORDER — ALBUTEROL SULFATE HFA 108 (90 BASE) MCG/ACT IN AERS: 2.0000 | INHALATION_SPRAY | Freq: Four times a day (QID) | RESPIRATORY_TRACT | Status: DC | PRN

## 2013-07-24 NOTE — Progress Notes (Signed)
   Subjective:    Patient ID: Margaret Sullivan, female    DOB: [birth year], 68 y.o.   MRN: [id number]  HPI Comments: 68 yo WF with increased allergy drainage over the last 2 weeks. She notes Sunday started green production from nose. She has been have head/ chest congestion increased. She has been using herbals to help with symptoms. She had Clindamycin easter weekend for tooth abscess and notes symptoms improved.  Cough     Medication List       This list is accurate as of: 07/24/13 12:00 PM.  Always use your most recent med list.               ALPRAZolam 0.25 MG tablet  Commonly known as:  XANAX  Take 1/2 to 1 tablet 3 x daily as need for anxiety     PROBIOTIC PO  Take 1 capsule by mouth 4 (four) times a week. Naturlen.     TYLENOL EXTRA STRENGTH PO  Take by mouth. PRN     VITAMIN D PO  Take 2,000 Units by mouth daily.       Allergies  Allergen Reactions  . Ampicillin Hives and Swelling  . Aspirin Itching  . Doxycycline Itching and Swelling  . Macrodantin [Nitrofurantoin Macrocrystal] Hives, Itching and Swelling  . Percocet [Oxycodone-Acetaminophen]   . Propulsid [Cisapride] Itching and Swelling  . Voltaren [Diclofenac] Hives and Itching  . Codeine Palpitations  . Penicillins Swelling and Rash    Past Medical History  Diagnosis Date  . Essential tremor   . GERD (gastroesophageal reflux disease)   . Heart murmur   . High cholesterol   . Hypertension     labile  . Prediabetes   . Labile hypertension   . Hyperlipidemia       Review of Systems  HENT: Positive for congestion and sinus pressure.   Respiratory: Positive for cough.   All other systems reviewed and are negative.  BP 154/78  Pulse 92  Temp(Src) 98.2 F (36.8 C) (Temporal)  Resp 18  Ht 5' 4"  (1.626 m)  Wt 157 lb (71.215 kg)  BMI 26.94 kg/m2  SpO2 98%     Objective:   Physical Exam  Nursing note and vitals reviewed. Constitutional: She is oriented to person, place, and time. She  appears well-developed and well-nourished.  HENT:  Head: Normocephalic and atraumatic.  Right Ear: External ear normal.  Left Ear: External ear normal.  Nose: Nose normal.  Mouth/Throat: Oropharynx is clear and moist. No oropharyngeal exudate.  Maxillary tenderness right  Eyes: Conjunctivae and EOM are normal.  Neck: Normal range of motion.  Cardiovascular: Normal rate, regular rhythm, normal heart sounds and intact distal pulses.   Pulmonary/Chest: Effort normal and breath sounds normal.  Congested Breath sounds, clears some with cough   Musculoskeletal: Normal range of motion.  Lymphadenopathy:    She has no cervical adenopathy.  Neurological: She is alert and oriented to person, place, and time.  Skin: Skin is warm and dry.  Psychiatric: She has a normal mood and affect. Judgment normal.          Assessment & Plan:  1. Sinusitis/ cough/ Allergic rhinitis- Allegra OTC, increase H2o, allergy hygiene explained. ZPAK, Pred DP 10 mg Both AD. Tessalon perles, Albuterol HFA AD.  2. HTN- Check BP call if >130/80, increase cardio

## 2013-07-24 NOTE — Patient Instructions (Signed)
Allergic Rhinitis Allergic rhinitis is when the mucous membranes in the nose respond to allergens. Allergens are particles in the air that cause your body to have an allergic reaction. This causes you to release allergic antibodies. Through a chain of events, these eventually cause you to release histamine into the blood stream. Although meant to protect the body, it is this release of histamine that causes your discomfort, such as frequent sneezing, congestion, and an itchy, runny nose.  CAUSES  Seasonal allergic rhinitis (hay fever) is caused by pollen allergens that may come from grasses, trees, and weeds. Year-round allergic rhinitis (perennial allergic rhinitis) is caused by allergens such as house dust mites, pet dander, and mold spores.  SYMPTOMS   Nasal stuffiness (congestion).  Itchy, runny nose with sneezing and tearing of the eyes. DIAGNOSIS  Your health care provider can help you determine the allergen or allergens that trigger your symptoms. If you and your health care provider are unable to determine the allergen, skin or blood testing may be used. TREATMENT  Allergic Rhinitis does not have a cure, but it can be controlled by:  Medicines and allergy shots (immunotherapy).  Avoiding the allergen. Hay fever may often be treated with antihistamines in pill or nasal spray forms. Antihistamines block the effects of histamine. There are over-the-counter medicines that may help with nasal congestion and swelling around the eyes. Check with your health care provider before taking or giving this medicine.  If avoiding the allergen or the medicine prescribed do not work, there are many new medicines your health care provider can prescribe. Stronger medicine may be used if initial measures are ineffective. Desensitizing injections can be used if medicine and avoidance does not work. Desensitization is when a patient is given ongoing shots until the body becomes less sensitive to the allergen.  Make sure you follow up with your health care provider if problems continue. HOME CARE INSTRUCTIONS It is not possible to completely avoid allergens, but you can reduce your symptoms by taking steps to limit your exposure to them. It helps to know exactly what you are allergic to so that you can avoid your specific triggers. SEEK MEDICAL CARE IF:   You have a fever.  You develop a cough that does not stop easily (persistent).  You have shortness of breath.  You start wheezing.  Symptoms interfere with normal daily activities. Document Released: 11/14/2000 Document Revised: 12/10/2012 Document Reviewed: 10/27/2012 [facility] Patient Information 2014 [place]. Sinusitis Sinusitis is redness, soreness, and puffiness (inflammation) of the air pockets in the bones of your face (sinuses). The redness, soreness, and puffiness can cause air and mucus to get trapped in your sinuses. This can allow germs to grow and cause an infection.  HOME CARE   Drink enough fluids to keep your pee (urine) clear or pale yellow.  Use a humidifier in your home.  Run a hot shower to create steam in the bathroom. Sit in the bathroom with the door closed. Breathe in the steam 3 4 times a day.  Put a warm, moist washcloth on your face 3 4 times a day, or as told by your doctor.  Use salt water sprays (saline sprays) to wet the thick fluid in your nose. This can help the sinuses drain.  Only take medicine as told by your doctor. GET HELP RIGHT AWAY IF:   Your pain gets worse.  You have very bad headaches.  You are sick to your stomach (nauseous).  You throw up (vomit).  You are very sleepy (drowsy) all the time.  Your face is puffy (swollen).  Your vision changes.  You have a stiff neck.  You have trouble breathing. MAKE SURE YOU:   Understand these instructions.  Will watch your condition.  Will get help right away if you are not doing well or get worse. Document Released: 08/08/2007  Document Revised: 11/14/2011 Document Reviewed: 09/25/2011 [facility] Patient Information 2014 [place].

## 2013-11-23 ENCOUNTER — Ambulatory Visit: Payer: Self-pay | Admitting: Internal Medicine

## 2014-01-06 ENCOUNTER — Encounter: Payer: Self-pay | Admitting: Emergency Medicine

## 2014-01-12 ENCOUNTER — Ambulatory Visit (INDEPENDENT_AMBULATORY_CARE_PROVIDER_SITE_OTHER): Payer: Medicare Other | Admitting: Physician Assistant

## 2014-01-12 ENCOUNTER — Encounter: Payer: Self-pay | Admitting: Physician Assistant

## 2014-01-12 VITALS — BP 144/86 | HR 72 | Temp 97.7°F | Resp 16 | Ht 64.0 in | Wt 165.0 lb

## 2014-01-12 DIAGNOSIS — N3000 Acute cystitis without hematuria: Secondary | ICD-10-CM

## 2014-01-12 DIAGNOSIS — Z789 Other specified health status: Secondary | ICD-10-CM

## 2014-01-12 DIAGNOSIS — R6889 Other general symptoms and signs: Secondary | ICD-10-CM

## 2014-01-12 DIAGNOSIS — Z79899 Other long term (current) drug therapy: Secondary | ICD-10-CM

## 2014-01-12 DIAGNOSIS — D509 Iron deficiency anemia, unspecified: Secondary | ICD-10-CM

## 2014-01-12 DIAGNOSIS — R011 Cardiac murmur, unspecified: Secondary | ICD-10-CM

## 2014-01-12 DIAGNOSIS — R7303 Prediabetes: Secondary | ICD-10-CM

## 2014-01-12 DIAGNOSIS — E559 Vitamin D deficiency, unspecified: Secondary | ICD-10-CM

## 2014-01-12 DIAGNOSIS — I10 Essential (primary) hypertension: Secondary | ICD-10-CM

## 2014-01-12 DIAGNOSIS — K2100 Gastro-esophageal reflux disease with esophagitis, without bleeding: Secondary | ICD-10-CM

## 2014-01-12 DIAGNOSIS — R0989 Other specified symptoms and signs involving the circulatory and respiratory systems: Secondary | ICD-10-CM

## 2014-01-12 DIAGNOSIS — Z0001 Encounter for general adult medical examination with abnormal findings: Secondary | ICD-10-CM

## 2014-01-12 DIAGNOSIS — E782 Mixed hyperlipidemia: Secondary | ICD-10-CM

## 2014-01-12 DIAGNOSIS — Z1331 Encounter for screening for depression: Secondary | ICD-10-CM

## 2014-01-12 DIAGNOSIS — E538 Deficiency of other specified B group vitamins: Secondary | ICD-10-CM

## 2014-01-12 LAB — CBC WITH DIFFERENTIAL/PLATELET
BASOS PCT: 0 % (ref 0–1)
Basophils Absolute: 0 10*3/uL (ref 0.0–0.1)
EOS ABS: 0.2 10*3/uL (ref 0.0–0.7)
EOS PCT: 2 % (ref 0–5)
HCT: 41.7 % (ref 36.0–46.0)
Hemoglobin: 14 g/dL (ref 12.0–15.0)
Lymphocytes Relative: 24 % (ref 12–46)
Lymphs Abs: 2.7 10*3/uL (ref 0.7–4.0)
MCH: 30.1 pg (ref 26.0–34.0)
MCHC: 33.6 g/dL (ref 30.0–36.0)
MCV: 89.7 fL (ref 78.0–100.0)
Monocytes Absolute: 0.9 10*3/uL (ref 0.1–1.0)
Monocytes Relative: 8 % (ref 3–12)
NEUTROS PCT: 66 % (ref 43–77)
Neutro Abs: 7.4 10*3/uL (ref 1.7–7.7)
PLATELETS: 267 10*3/uL (ref 150–400)
RBC: 4.65 MIL/uL (ref 3.87–5.11)
RDW: 13.7 % (ref 11.5–15.5)
WBC: 11.2 10*3/uL — ABNORMAL HIGH (ref 4.0–10.5)

## 2014-01-12 LAB — URINALYSIS, ROUTINE W REFLEX MICROSCOPIC
Bilirubin Urine: NEGATIVE
Glucose, UA: NEGATIVE mg/dL
Hgb urine dipstick: NEGATIVE
Ketones, ur: NEGATIVE mg/dL
LEUKOCYTES UA: NEGATIVE
Nitrite: NEGATIVE
Protein, ur: NEGATIVE mg/dL
Specific Gravity, Urine: 1.006 (ref 1.005–1.030)
UROBILINOGEN UA: 0.2 mg/dL (ref 0.0–1.0)
pH: 6 (ref 5.0–8.0)

## 2014-01-12 NOTE — Progress Notes (Signed)
MEDICARE ANNUAL WELLNESS VISIT AND CPE  Assessment:    1. Hypertension - Declines medication, wants to try to decrease with diet/exercise, did discuss atenolol as a possiblity -  CBC with Differential - BASIC METABOLIC PANEL WITH GFR - Hepatic function panel - TSH - Urinalysis, Routine w reflex microscopic - Microalbumin / creatinine urine ratio - EKG 12-Lead - [place], RETROPERITNL ABD,  LTD  2. GERD Continue PPI/H2 blocker, diet discussed  3. Prediabetes Discussed general issues about diabetes pathophysiology and management., Educational material distributed., Suggested low cholesterol diet., Encouraged aerobic exercise., Discussed foot care., Reminded to get yearly retinal exam. - Hemoglobin A1c - Insulin, fasting - HM DIABETES FOOT EXAM  4. Heart murmur stable  5. Hyperlipidemia - Lipid panel  6. Vitamin D deficiency - Vit D  25 hydroxy (rtn osteoporosis monitoring)  7. Medication management - Magnesium  8. B12 deficiency - Vitamin B12  9. Acute cystitis without hematuria - Urine culture  10. Anemia, iron deficiency - Iron and TIBC - Ferritin  11. Colonoscopy - follow up Dr. Olevia Perches  12. Need Tetanus - check and see if has had since 2000 here, if not next visit.   Plan:   During the course of the visit the patient was educated and counseled about appropriate screening and preventive services including:    Pneumococcal vaccine   Influenza vaccine  Td vaccine  Screening electrocardiogram  Bone densitometry screening  Colorectal cancer screening  Diabetes screening  Glaucoma screening  Nutrition counseling   Advanced directives: requested  Screening recommendations, referrals: Vaccinations: Please see documentation below and orders this visit.  Nutrition assessed and recommended  Colonoscopy requested Recommended yearly ophthalmology/optometry visit for glaucoma screening and checkup Recommended yearly dental visit for hygiene and  checkup Advanced directives - requested  Conditions/risks identified: BMI: Discussed weight loss, diet, and increase physical activity.  Increase physical activity: AHA recommends 150 minutes of physical activity a week.  Medications reviewed Urinary Incontinence is an issue: discussed non pharmacology and pharmacology options.  Fall risk: low- discussed PT, home fall assessment, medications.    Subjective:  Margaret Sullivan is a 68 y.o. female who presents for Medicare Annual Wellness Visit and complete physical.  Date of last medicare wellness visit is unknown.   Her blood pressure has been controlled at home, today their BP is BP: (!) 144/86 mmHg She does not workout, she likes walking but she has been so busy she hast not. She denies chest pain, shortness of breath, dizziness.  She is not on cholesterol medication and denies myalgias. Her cholesterol is not at goal. The cholesterol last visit was:   Lab Results  Component Value Date   CHOL 172 06/10/2013   HDL 33* 06/10/2013   LDLCALC 114* 06/10/2013   TRIG 127 06/10/2013   CHOLHDL 5.2 06/10/2013    She has been working on diet and exercise for prediabetes, and denies polydipsia, polyuria, visual disturbances and vomiting. Last A1C in the office was:  Lab Results  Component Value Date   HGBA1C 5.5 06/10/2013   Patient is on Vitamin D supplement.   Lab Results  Component Value Date   VD25OH 32 06/10/2013     She states over the last 3 years she has had 5 family members die and 1 very close friend, she has been caregiver for several of these people and has been out of town a lot. She states she is just starting to take care of herself again.  Retired Pharmacist, hospital.  She has GERD  but tries diet and not eating before bed which helps.  She states she normally sleeps well but the last 2 months she has been waking up in the night. Some to urinate, will check urine, unknown if she snores or not. Does not want medication.   Names of Other  Physician/Practitioners you currently use: 1. [facility] here for primary care Patient Care Team: Unk Pinto, MD as PCP - General (Internal Medicine) Milus Banister, MD as Attending Physician (Gastroenterology) Fabio Pierce, MD as Consulting Physician (Ophthalmology) Earley Favor (Dentistry)   Medication Review: Current Outpatient Prescriptions on File Prior to Visit  Medication Sig Dispense Refill  . Acetaminophen (TYLENOL EXTRA STRENGTH PO) Take by mouth. PRN    . ALPRAZolam (XANAX) 0.25 MG tablet Take 1/2 to 1 tablet 3 x daily as need for anxiety 90 tablet 5  . Cholecalciferol (VITAMIN D PO) Take 5,000 Units by mouth daily.      No current facility-administered medications on file prior to visit.    Current Problems (verified) Patient Active Problem List   Diagnosis Date Noted  . Vitamin D Deficiency 06/10/2013  . Encounter for long-term (current) use of other medications 06/10/2013  . GERD   . Heart murmur   . Hyperlipidemia   . Prediabetes   . Hypertension    Past Surgical History  Procedure Laterality Date  . Back surgery     Family History  Problem Relation Age of Onset  . Lymphoma Mother   . Heart attack Father    Screening Tests Health Maintenance  Topic Date Due  . COLONOSCOPY  12/24/1995  . ZOSTAVAX  12/23/2005  . TETANUS/TDAP  03/05/2008  . INFLUENZA VACCINE  10/03/2013  . MAMMOGRAM  11/30/2013  . PNEUMOCOCCAL POLYSACCHARIDE VACCINE AGE 33 AND OVER  Completed    Immunization History  Administered Date(s) Administered  . Pneumococcal-Unspecified 03/05/1997  . Td 03/05/1998    Preventative care: Last colonoscopy: 2008 DUE for this and she would like to see Dr. Olevia Perches Last mammogram: 11/2013 normal Last pap smear/pelvic exam: 2010   DEXA: 4-5 years, normal, declines another Hep panel neg 2013 [place] AB 2013, + gallstone  Prior vaccinations: TD or Tdap: 2000  Influenza: declines  Pneumococcal:  1999 Prevnar 13: declines Shingles/Zostavax: declines  History reviewed: allergies, current medications, past family history, past medical history, past social history, past surgical history and problem list   Risk Factors: Osteoporosis/FallRisk: postmenopausal estrogen deficiency and dietary calcium and/or vitamin D deficiency In the past year have you fallen or had a near fall?:No History of fracture in the past year: no  Tobacco History  Substance Use Topics  . Smoking status: Never Smoker   . Smokeless tobacco: Not on file  . Alcohol Use: No   She does not smoke.  Patient is not a former smoker. Are there smokers in your home (other than you)?  No  Alcohol Current alcohol use: none  Caffeine Current caffeine use: coffee 1 /day  Exercise Current exercise: none  Nutrition/Diet Current diet: in general, a "healthy" diet    Cardiac risk factors: advanced age (older than 77 for men, 22 for women), dyslipidemia, hypertension and obesity (BMI >= 30 kg/m2).  Depression Screen (Note: if answer to either of the following is "Yes", a more complete depression screening is indicated)   Q1: Over the past two weeks, have you felt down, depressed or hopeless? No  Q2: Over the past two weeks, have you felt little interest or  pleasure in doing things? No  Have you lost interest or pleasure in daily life? No  Do you often feel hopeless? No  Do you cry easily over simple problems? No  Activities of Daily Living In your present state of health, do you have any difficulty performing the following activities?:  Driving? No Managing money?  No Feeding yourself? No Getting from bed to chair? No Climbing a flight of stairs? No Preparing food and eating?: No Bathing or showering? No Getting dressed: No Getting to the toilet? No Using the toilet:No Moving around from place to place: No In the past year have you fallen or had a near fall?:No   Are you sexually active?  No  Do you  have more than one partner?  No  Vision Difficulties: No  Hearing Difficulties: No Do you often ask people to speak up or repeat themselves? Yes Do you experience ringing or noises in your ears? No Do you have difficulty understanding soft or whispered voices? No  Cognition  Do you feel that you have a problem with memory?No  Do you often misplace items? No  Do you feel safe at home?  Yes  Advanced directives Does patient have a [place]? Yes Does patient have a Living Will? Yes   Objective:     Blood pressure 144/86, pulse 72, temperature 97.7 F (36.5 C), resp. rate 16, height 5' 4"  (1.626 m), weight 165 lb (74.844 kg). Body mass index is 28.31 kg/(m^2).  General appearance: alert, no distress, WD/WN, female Cognitive Testing  Alert? Yes  Normal Appearance?Yes  Oriented to person? Yes  Place? Yes   Time? Yes  Recall of three objects?  Yes  Can perform simple calculations? Yes  Displays appropriate judgment?Yes  Can read the correct time from a watch face?Yes  HEENT: normocephalic, sclerae anicteric, TMs pearly, nares patent, no discharge or erythema, pharynx normal Oral cavity: MMM, no lesions Neck: supple, no lymphadenopathy, no thyromegaly, no masses Heart: RRR, normal S1, S2, no murmurs Lungs: CTA bilaterally, no wheezes, rhonchi, or rales Abdomen: +bs, soft, non tender, non distended, no masses, no hepatomegaly, no splenomegaly Musculoskeletal: nontender, no swelling, no obvious deformity Extremities: no edema, no cyanosis, no clubbing Pulses: 2+ symmetric, upper and lower extremities, normal cap refill Neurological: alert, oriented x 3, CN2-12 intact, strength normal upper extremities and lower extremities, sensation normal throughout, DTRs 2+ throughout, no cerebellar signs, gait normal Psychiatric: normal affect, behavior normal, pleasant   Medicare Attestation I have personally reviewed: The patient's medical and social history Their  use of alcohol, tobacco or illicit drugs Their current medications and supplements The patient's functional ability including ADLs,fall risks, home safety risks, cognitive, and hearing and visual impairment Diet and physical activities Evidence for depression or mood disorders  The patient's weight, height, BMI, and visual acuity have been recorded in the chart.  I have made referrals, counseling, and provided education to the patient based on review of the above and I have provided the patient with a written personalized care plan for preventive services.     Vicie Mutters, PA-C   01/12/2014

## 2014-01-12 NOTE — Patient Instructions (Signed)

## 2014-01-13 LAB — BASIC METABOLIC PANEL WITH GFR
BUN: 13 mg/dL (ref 6–23)
CALCIUM: 9.8 mg/dL (ref 8.4–10.5)
CO2: 28 mEq/L (ref 19–32)
CREATININE: 0.69 mg/dL (ref 0.50–1.10)
Chloride: 100 mEq/L (ref 96–112)
GFR, Est Non African American: >89 mL/min
GLUCOSE: 99 mg/dL (ref 70–99)
Potassium: 4.4 mEq/L (ref 3.5–5.3)
SODIUM: 139 meq/L (ref 135–145)

## 2014-01-13 LAB — IRON AND TIBC
%SAT: 34 % (ref 20–55)
Iron: 125 ug/dL (ref 42–145)
TIBC: 363 ug/dL (ref 250–470)
UIBC: 238 ug/dL (ref 125–400)

## 2014-01-13 LAB — MICROALBUMIN / CREATININE URINE RATIO
Creatinine, Urine: 45.1 mg/dL
MICROALB UR: 0.6 mg/dL (ref <2.0)
Microalb Creat Ratio: 13.3 mg/g (ref 0.0–30.0)

## 2014-01-13 LAB — LIPID PANEL
CHOLESTEROL: 230 mg/dL — AB (ref 0–200)
HDL: 38 mg/dL — ABNORMAL LOW (ref >39)
LDL Cholesterol: 150 mg/dL — ABNORMAL HIGH (ref 0–99)
TRIGLYCERIDES: 212 mg/dL — AB (ref <150)
Total CHOL/HDL Ratio: 6.1 Ratio
VLDL: 42 mg/dL — AB (ref 0–40)

## 2014-01-13 LAB — URINE CULTURE
COLONY COUNT: NO GROWTH
ORGANISM ID, BACTERIA: NO GROWTH

## 2014-01-13 LAB — VITAMIN D 25 HYDROXY (VIT D DEFICIENCY, FRACTURES): VIT D 25 HYDROXY: 59 ng/mL (ref 30–89)

## 2014-01-13 LAB — FERRITIN: Ferritin: 355 ng/mL — ABNORMAL HIGH (ref 10–291)

## 2014-01-13 LAB — MAGNESIUM: Magnesium: 2.1 mg/dL (ref 1.5–2.5)

## 2014-01-13 LAB — HEPATIC FUNCTION PANEL
ALBUMIN: 4.6 g/dL (ref 3.5–5.2)
ALT: 65 U/L — AB (ref 0–35)
AST: 71 U/L — AB (ref 0–37)
Alkaline Phosphatase: 85 U/L (ref 39–117)
BILIRUBIN DIRECT: 0.1 mg/dL (ref 0.0–0.3)
Indirect Bilirubin: 0.6 mg/dL (ref 0.2–1.2)
TOTAL PROTEIN: 8 g/dL (ref 6.0–8.3)
Total Bilirubin: 0.7 mg/dL (ref 0.2–1.2)

## 2014-01-13 LAB — HEMOGLOBIN A1C
HEMOGLOBIN A1C: 5.8 % — AB (ref <5.7)
Mean Plasma Glucose: 120 mg/dL — ABNORMAL HIGH (ref <117)

## 2014-01-13 LAB — TSH: TSH: 2.489 u[IU]/mL (ref 0.350–4.500)

## 2014-01-13 LAB — VITAMIN B12: Vitamin B-12: 309 pg/mL (ref 211–911)

## 2014-01-13 LAB — INSULIN, FASTING: INSULIN FASTING, SERUM: 20.5 u[IU]/mL — AB (ref 2.0–19.6)

## 2014-01-24 ENCOUNTER — Encounter: Payer: Self-pay | Admitting: *Deleted

## 2014-02-15 ENCOUNTER — Ambulatory Visit: Payer: Self-pay

## 2014-03-18 ENCOUNTER — Encounter: Payer: Self-pay | Admitting: Gastroenterology

## 2014-03-29 ENCOUNTER — Ambulatory Visit: Payer: Self-pay

## 2014-04-08 IMAGING — US US SOFT TISSUE HEAD/NECK
1 series · 14 of 25 positions shown · non-contrast
Comparison: None.

CLINICAL DATA: Thyroid goiter, follow-up

THYROID ULTRASOUND
TECHNIQUE: Ultrasound examination of the thyroid gland and adjacent
soft tissues was performed.

[Series 1: us soft tissue head/neck · 0.09mm/px · 14 of 58 slices shown]
[im 1/58]
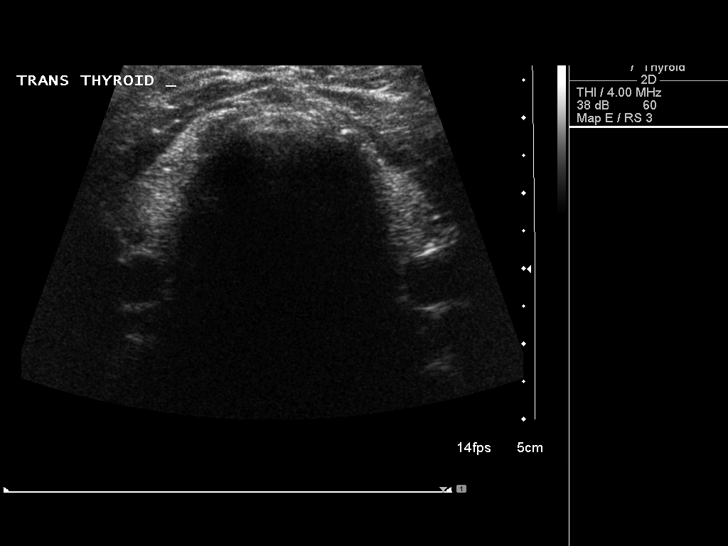
[im 5/58]
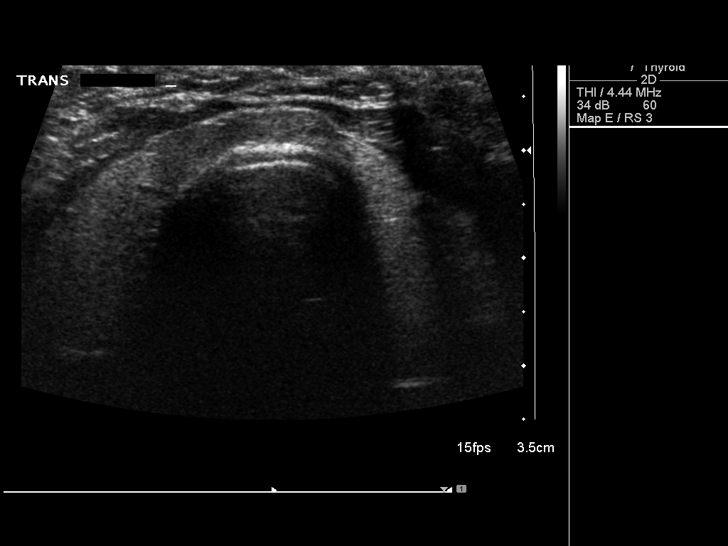
[im 10/58]
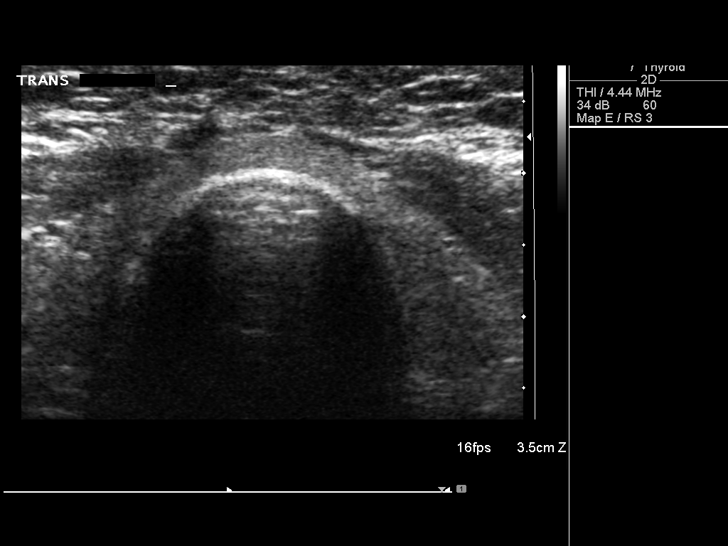
[im 15/58]
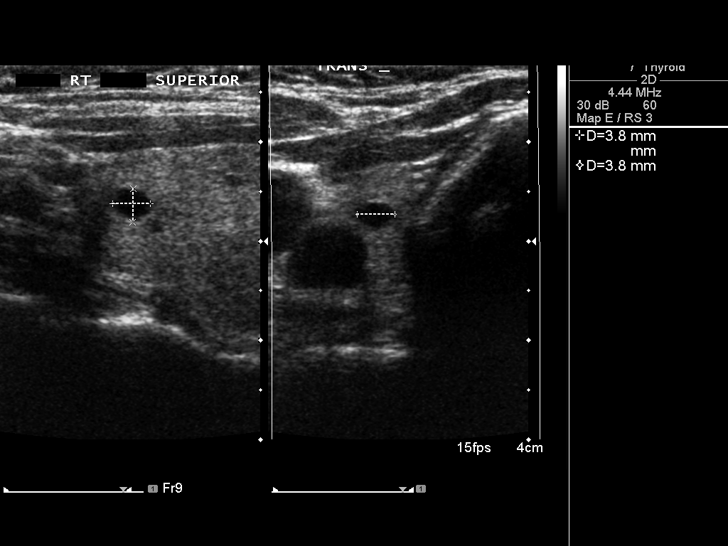
[im 20/58]
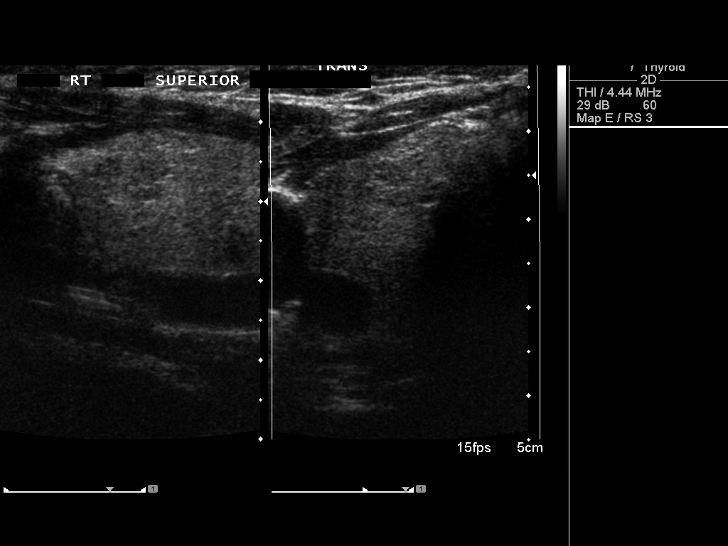
[im 22/58]
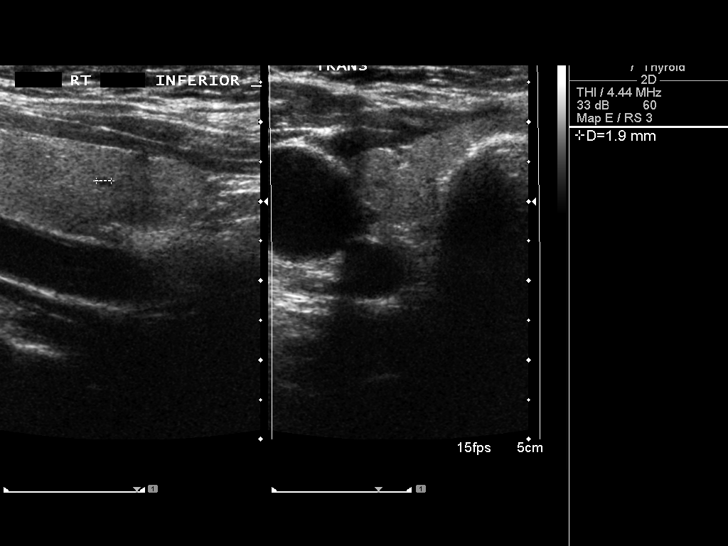
[im 27/58]
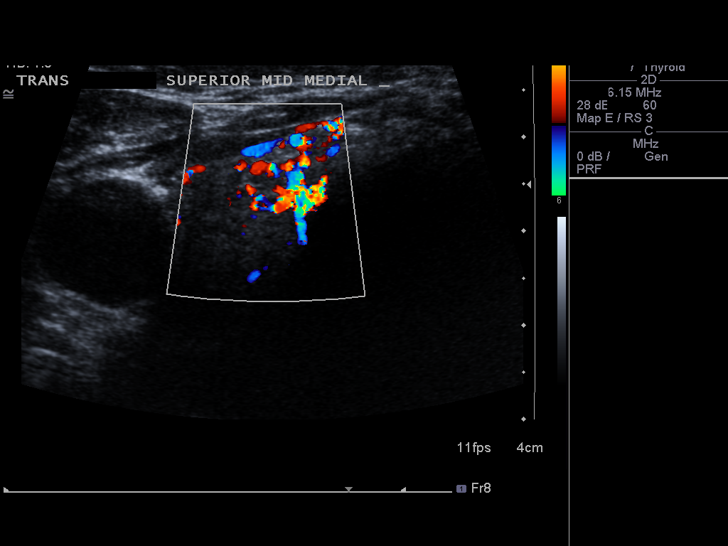
[im 31/58]
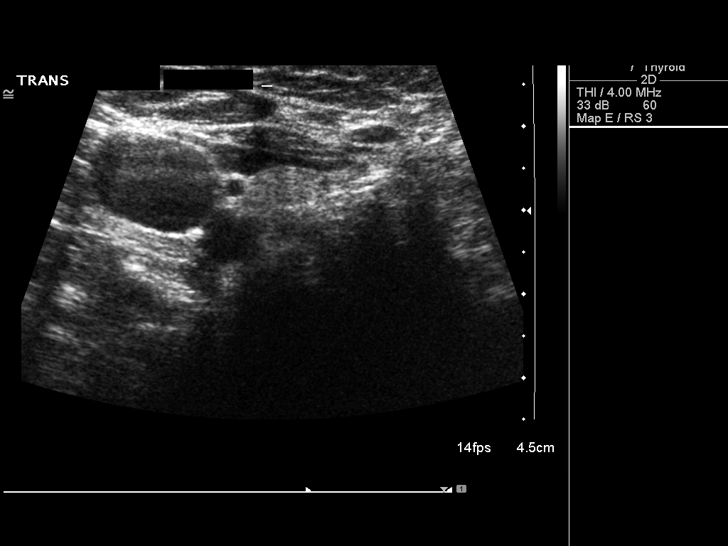
[im 36/58]
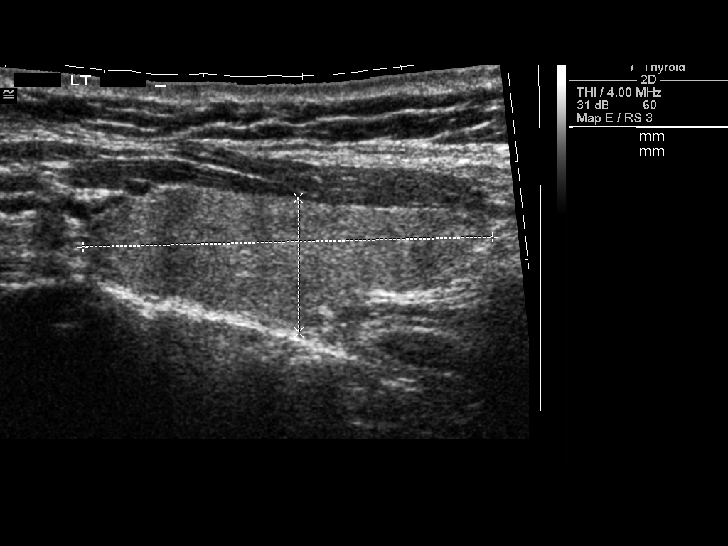
[im 39/58]
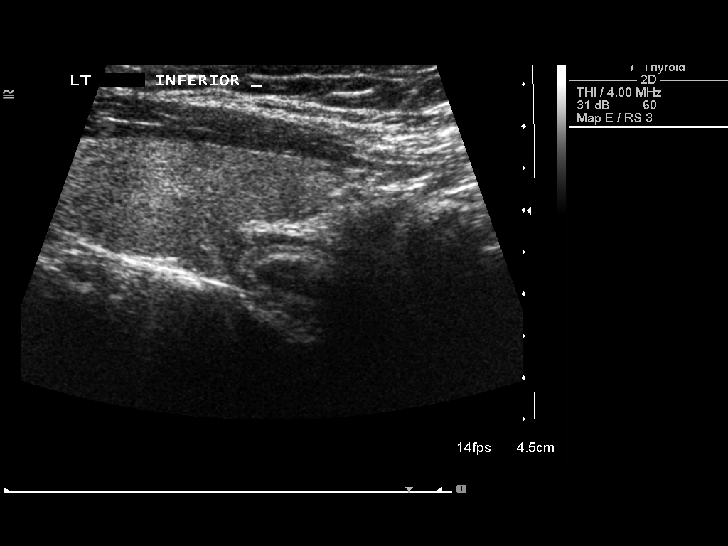
[im 43/58]
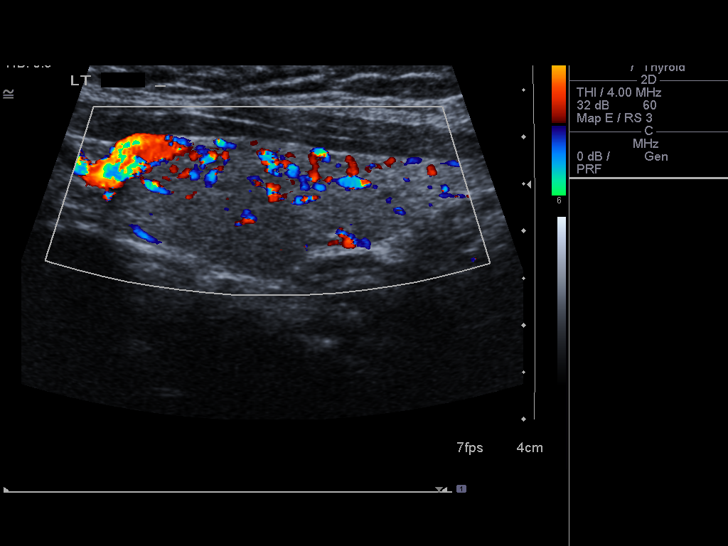
[im 48/58]
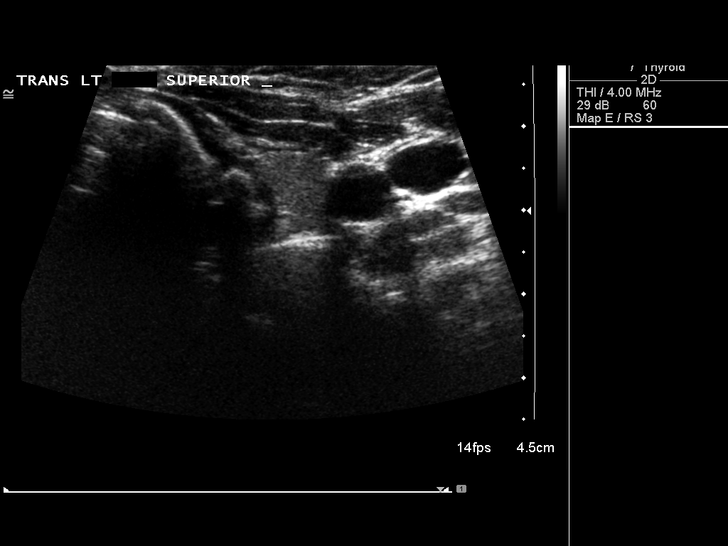
[im 53/58]
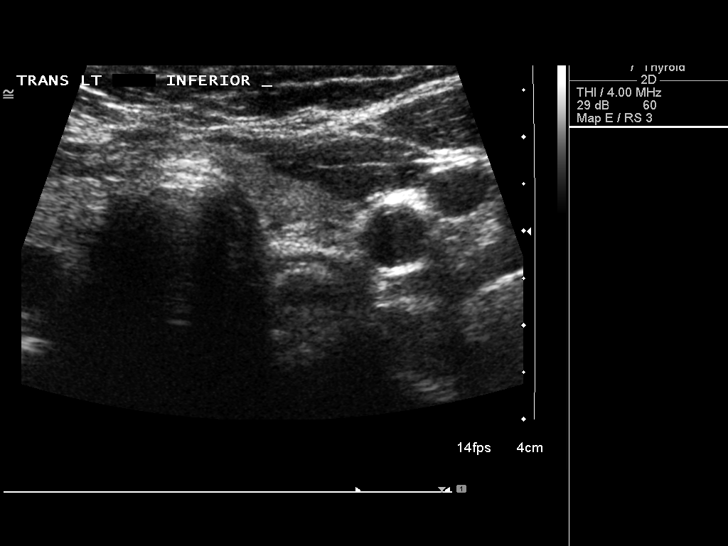
[im 58/58]
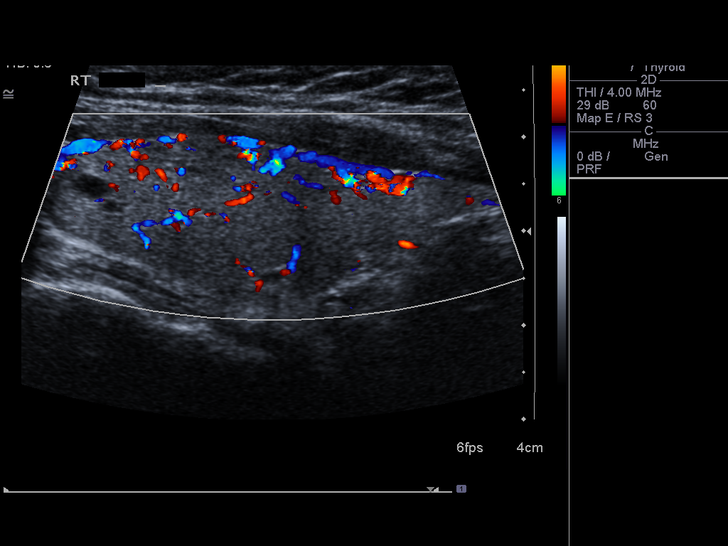

[14 of 25 positions shown; findings below may reference images not displayed]

FINDINGS: Right thyroid lobe:  4.3 x 1.8 x 1.4 cm.
Left thyroid lobe:  4.1 x 1.4 x 1.3 cm.
Isthmus:  3.6 mm in thickness.

Focal nodules:  The echogenicity of the thyroid gland is only
slightly inhomogeneous.  There is an indistinct solid appearing
nodule in the upper lobe of thyroid medially measuring 1.4 x 0.9 x
1.1 cm.  A hypoechoic nodule in the upper pole measures 4 mm in
diameter.  Small nodular areas are present throughout the thyroid
gland bilaterally of 3 mm or less in size.

Lymphadenopathy:  None visualized.
IMPRESSION: The thyroid gland is normal in size with small nodules bilaterally.
The largest indistinct nodule is in the upper pole on the right
measuring 1.4 cm.  Recommend continued follow-up in 1 year.

## 2014-04-29 ENCOUNTER — Ambulatory Visit (INDEPENDENT_AMBULATORY_CARE_PROVIDER_SITE_OTHER): Payer: Medicare Other | Admitting: Internal Medicine

## 2014-04-29 ENCOUNTER — Encounter: Payer: Self-pay | Admitting: Internal Medicine

## 2014-04-29 VITALS — BP 144/80 | HR 80 | Temp 97.9°F | Resp 16 | Ht 64.0 in | Wt 166.0 lb

## 2014-04-29 DIAGNOSIS — Z1331 Encounter for screening for depression: Secondary | ICD-10-CM

## 2014-04-29 DIAGNOSIS — Z9119 Patient's noncompliance with other medical treatment and regimen: Secondary | ICD-10-CM

## 2014-04-29 DIAGNOSIS — K2100 Gastro-esophageal reflux disease with esophagitis, without bleeding: Secondary | ICD-10-CM

## 2014-04-29 DIAGNOSIS — I10 Essential (primary) hypertension: Secondary | ICD-10-CM

## 2014-04-29 DIAGNOSIS — E559 Vitamin D deficiency, unspecified: Secondary | ICD-10-CM

## 2014-04-29 DIAGNOSIS — R0989 Other specified symptoms and signs involving the circulatory and respiratory systems: Secondary | ICD-10-CM

## 2014-04-29 DIAGNOSIS — E782 Mixed hyperlipidemia: Secondary | ICD-10-CM

## 2014-04-29 DIAGNOSIS — R7309 Other abnormal glucose: Secondary | ICD-10-CM

## 2014-04-29 DIAGNOSIS — Z0000 Encounter for general adult medical examination without abnormal findings: Secondary | ICD-10-CM

## 2014-04-29 DIAGNOSIS — Z9181 History of falling: Secondary | ICD-10-CM

## 2014-04-29 DIAGNOSIS — Z79899 Other long term (current) drug therapy: Secondary | ICD-10-CM

## 2014-04-29 DIAGNOSIS — Z91199 Patient's noncompliance with other medical treatment and regimen due to unspecified reason: Secondary | ICD-10-CM

## 2014-04-29 DIAGNOSIS — R7303 Prediabetes: Secondary | ICD-10-CM

## 2014-04-29 LAB — CBC WITH DIFFERENTIAL/PLATELET
Basophils Absolute: 0.1 10*3/uL (ref 0.0–0.1)
Basophils Relative: 1 % (ref 0–1)
Eosinophils Absolute: 0.2 10*3/uL (ref 0.0–0.7)
Eosinophils Relative: 2 % (ref 0–5)
HEMATOCRIT: 41 % (ref 36.0–46.0)
Hemoglobin: 13.9 g/dL (ref 12.0–15.0)
LYMPHS ABS: 1.9 10*3/uL (ref 0.7–4.0)
LYMPHS PCT: 23 % (ref 12–46)
MCH: 30.3 pg (ref 26.0–34.0)
MCHC: 33.9 g/dL (ref 30.0–36.0)
MCV: 89.5 fL (ref 78.0–100.0)
MPV: 9.7 fL (ref 8.6–12.4)
Monocytes Absolute: 0.7 10*3/uL (ref 0.1–1.0)
Monocytes Relative: 9 % (ref 3–12)
NEUTROS ABS: 5.3 10*3/uL (ref 1.7–7.7)
Neutrophils Relative %: 65 % (ref 43–77)
Platelets: 232 10*3/uL (ref 150–400)
RBC: 4.58 MIL/uL (ref 3.87–5.11)
RDW: 13.8 % (ref 11.5–15.5)
WBC: 8.1 10*3/uL (ref 4.0–10.5)

## 2014-04-29 NOTE — Progress Notes (Signed)
Patient ID: Margaret Sullivan, female   DOB: 05-13-1945, 69 y.o.   MRN: [id number]  MEDICARE ANNUAL WELLNESS VISIT AND OV  Assessment:   1. Hypertension  - TSH  2. Hyperlipidemia  - Lipid panel  3. Prediabetes  - Hemoglobin A1c - Insulin, fasting  4. GERD  - Vit D  25 hydroxy   5. Vitamin D deficiency   6. Medication management - CBC with Differential/Platelet - BASIC METABOLIC PANEL WITH GFR - Hepatic function panel - Magnesium  Plan:   During the course of the visit the patient was educated and counseled about appropriate screening and preventive services including:    Pneumococcal vaccine   Influenza vaccine  Td vaccine  Screening electrocardiogram  Bone densitometry screening  Colorectal cancer screening  Diabetes screening  Glaucoma screening  Nutrition counseling   Advanced directives: requested  Screening recommendations, referrals: Vaccinations:  Immunization History  Administered Date(s) Administered  . Pneumococcal-Unspecified 03/05/1997  . Td 03/05/1998    DT vaccine 2000 & declines booster Influenza vaccine declined Pneumococcal vaccine 1999 & ha declined boosters Prevnar vaccine declined Shingles vaccine declined Hep B vaccine not indicated  Nutrition assessed and recommended  Colonoscopy 2000 Recommended yearly ophthalmology/optometry visit for glaucoma screening and checkup Recommended yearly dental visit for hygiene and checkup Advanced directives - yes  Conditions/risks identified: BMI: Discussed weight loss, diet, and increase physical activity.  Increase physical activity: AHA recommends 150 minutes of physical activity a week.  Medications reviewed PreDiabetes is at goal, ACE/ARB therapy: not indicated Urinary Incontinence is not an issue: discussed non pharmacology and pharmacology options.  Fall risk: low- discussed PT, home fall assessment, medications.   Subjective:   Margaret Sullivan  Is a very nice 69 yo DWF  who presents for Medicare Annual Wellness Visit and OV.  Date of last medicare wellness visit is unknown. Labile HTN predates many years. BP has been note moderately elevated in the range 200-210/ 100/110 associated with a dental abcess and monitored at home and at her dental office. Today's BP: 128/66 mmHg . Patient denies any cardiac type chest pain, palpitations, dyspnea/orthopnea/PND, dizziness, claudication, or dependent edema.  Hyperlipidemia is not controlled with diet & patient is recalcitrant to take  meds. Last Cholesterol was 251, Triglycerides were 188, HDL 36 and LDL 177 - not at goal and patient is adverse to taking meds for cholesterol despite recommendations otherwise. Patient denies myalgias or other med SE's.   Also, the patient has history of PreDiabetes with A1c 6.1% in Feb 2013 and with last A1c of 5.8% in Oct 2014. Patient denies any symptoms of reactive hypoglycemia, diabetic polys, paresthesias or visual blurring.  Further, Patient has history of Vitamin D Deficiency with last vitamin D of 32 in Oct 2014.   Names of Other Physician/Practitioners you currently use: 1. [facility] here for primary care 2. Dr Delight Stare, eye doctor, last visit Oct 2015 3. Dr Earley Favor, dentist, last visit Dec 2015  Patient Care Team: Unk Pinto, MD as PCP - General (Internal Medicine) Fabio Pierce, MD as Consulting Physician (Ophthalmology) Earley Favor (Dentistry) Lafayette Dragon, MD as Consulting Physician (Gastroenterology)  Medication Review: Medication Sig  . Acetaminophen (TYLENOL EXTRA STRENGTH PO) Take by mouth. PRN  . ALPRAZolam (XANAX) 0.25 MG tablet Take 1/2 to 1 tablet 3 x daily as need for anxiety  . Cholecalciferol (VITAMIN D PO) Take 5,000 Units by mouth daily.   . Probiotic Product (PROBIOTIC PO) Take by mouth  daily.   Current Problems (verified) Patient Active Problem List   Diagnosis Date  Noted  . Vitamin D deficiency 06/10/2013  . Medication management 06/10/2013  . GERD   . Hyperlipidemia   . Prediabetes   . Hypertension    Screening Tests Health Maintenance  Topic Date Due  . ZOSTAVAX  12/23/2005  . TETANUS/TDAP  03/05/2008  . DEXA SCAN  12/24/2010  . INFLUENZA VACCINE  10/03/2013  . MAMMOGRAM  10/15/2015  . COLONOSCOPY  08/14/2016  . PNEUMOCOCCAL POLYSACCHARIDE VACCINE AGE 15 AND OVER  Completed   Immunization History  Administered Date(s) Administered  . Pneumococcal-Unspecified 03/05/1997  . Td 03/05/1998    Preventative care: Last colonoscopy: 07/25/2006 - Dr Lyla Son  History reviewed: allergies, current medications, past family history, past medical history, past social history, past surgical history and problem list  Risk Factors: Tobacco History  Substance Use Topics  . Smoking status: Never Smoker   . Smokeless tobacco: Not on file  . Alcohol Use: No   She does not smoke.  Patient is not a former smoker. Are there smokers in your home (other than you)?  No Alcohol Current alcohol use: none  Caffeine Current caffeine use: coffee 1 cup /day  Exercise Current exercise: walking  Nutrition/Diet Current diet: in general, a "healthy" diet    Cardiac risk factors: advanced age (older than 35 for men, 37 for women), dyslipidemia, hypertension and sedentary lifestyle.  Depression Screen (Note: if answer to either of the following is "Yes", a more complete depression screening is indicated)   Q1: Over the past two weeks, have you felt down, depressed or hopeless? No  Q2: Over the past two weeks, have you felt little interest or pleasure in doing things? No  Have you lost interest or pleasure in daily life? No  Do you often feel hopeless? No  Do you cry easily over simple problems? No  Activities of Daily Living In your present state of health, do you have any difficulty performing the following activities?:  Driving? No Managing  money?  No Feeding yourself? No Getting from bed to chair? No Climbing a flight of stairs? No Preparing food and eating?: No Bathing or showering? No Getting dressed: No Getting to the toilet? No Using the toilet:No Moving around from place to place: No In the past year have you fallen or had a near fall?:No   Are you sexually active?  No  Do you have more than one partner?  No  Vision Difficulties: No  Hearing Difficulties: No Do you often ask people to speak up or repeat themselves? No Do you experience ringing or noises in your ears? No Do you have difficulty understanding soft or whispered voices? Sometimes.  Cognition  Do you feel that you have a problem with memory?No  Do you often misplace items? No  Do you feel safe at home?  Yes  Advanced directives Does patient have a [place]? Yes Does patient have a Living Will? Yes  Past Medical History  Diagnosis Date  . Essential tremor   . GERD (gastroesophageal reflux disease)   . Heart murmur   . High cholesterol   . Hypertension     labile  . Prediabetes   . Labile hypertension   . Hyperlipidemia    Past Surgical History  Procedure Laterality Date  . Back surgery      ROS: Constitutional: Denies fever, chills, weight loss/gain, headaches, insomnia, fatigue, night sweats, and change in appetite.  Eyes: Denies redness, blurred vision, diplopia, discharge, itchy, watery eyes.  ENT: Denies discharge, congestion, post nasal drip, epistaxis, sore throat, earache, hearing loss, dental pain, Tinnitus, Vertigo, Sinus pain, snoring.  Cardio: Denies chest pain, palpitations, irregular heartbeat, syncope, dyspnea, diaphoresis, orthopnea, PND, claudication, edema Respiratory: denies cough, dyspnea, DOE, pleurisy, hoarseness, laryngitis, wheezing.  Gastrointestinal: Denies dysphagia, heartburn, reflux, water brash, pain, cramps, nausea, vomiting, bloating, diarrhea, constipation, hematemesis, melena,  hematochezia, jaundice, hemorrhoids Genitourinary: Denies dysuria, frequency, urgency, nocturia, hesitancy, discharge, hematuria, flank pain Breast: Breast lumps, nipple discharge, bleeding.  Musculoskeletal: Denies arthralgia, myalgia, stiffness, Jt. Swelling, pain, limp, and strain/sprain. Denies falls. Skin: Denies puritis, rash, hives, warts, acne, eczema, changing in skin lesion Neuro: No weakness, tremor, incoordination, spasms, paresthesia, pain Psychiatric: Denies confusion, memory loss, sensory loss. Denies Depression. Endocrine: Denies change in weight, skin, hair change, nocturia, and paresthesia, diabetic polys, visual blurring, hyper / hypo glycemic episodes.  Heme/Lymph: No excessive bleeding, bruising, enlarged lymph nodes  Objective:     BP 144/80  Pulse 80  Temp  97.9 F   Resp 16  Ht 5' 4"    Wt 166 lb     BMI 28.48   General Appearance: Well nourished, alert, WD/WN, femaleand in no apparent distress. Eyes: PERRLA, EOMs, conjunctiva no swelling or erythema, normal fundi and vessels. Sinuses: No frontal/maxillary tenderness ENT/Mouth: EACs patent / TMs  nl. Nares clear without erythema, swelling, mucoid exudates. Oral hygiene is good. No erythema, swelling, or exudate. Tongue normal, non-obstructing. Tonsils not swollen or erythematous. Hearing normal.  Neck: Supple, thyroid normal. No bruits, nodes or JVD. Respiratory: Respiratory effort normal.  BS equal and clear bilateral without rales, rhonci, wheezing or stridor. Cardio: Heart sounds are normal with regular rate and rhythm and no murmurs, rubs or gallops. Peripheral pulses are normal and equal bilaterally without edema. No aortic or femoral bruits. Chest: symmetric with normal excursions and percussion. Breasts: Symmetric, without lumps, nipple discharge, retractions, or fibrocystic changes.  Abdomen: Flat, soft  with nl bowel sounds. Nontender, no guarding, rebound, hernias, masses, or organomegaly.  Lymphatics:  Non tender without lymphadenopathy.  Genitourinary:  Musculoskeletal: Full ROM all peripheral extremities, joint stability, 5/5 strength, and normal gait. Skin: Warm and dry without rashes, lesions, cyanosis, clubbing or  ecchymosis.  Neuro: Cranial nerves intact, reflexes equal bilaterally. Normal muscle tone, no cerebellar symptoms. Sensation intact.  Pysch: Awake and oriented X 3, normal affect, Insight and Judgment appropriate.   Cognitive Testing  Alert? Yes  Normal Appearance?Yes  Oriented to person? Yes  Place? Yes   Time? Yes  Recall of three objects?  Yes  Can perform simple calculations? Yes  Displays appropriate judgment? Yes  Can read the correct time from a watch/clock?Yes  Medicare Attestation I have personally reviewed: The patient's medical and social history Their use of alcohol, tobacco or illicit drugs Their current medications and supplements The patient's functional ability including ADLs,fall risks, home safety risks, cognitive, and hearing and visual impairment Diet and physical activities Evidence for depression or mood disorders  The patient's weight, height, BMI, and visual acuity have been recorded in the chart.  I have made referrals, counseling, and provided education to the patient based on review of the above and I have provided the patient with a written personalized care plan for preventive services.    Othel Hoogendoorn DAVID, MD   04/29/2014

## 2014-04-30 LAB — BASIC METABOLIC PANEL WITH GFR
BUN: 12 mg/dL (ref 6–23)
CO2: 26 meq/L (ref 19–32)
Calcium: 9.2 mg/dL (ref 8.4–10.5)
Chloride: 101 mEq/L (ref 96–112)
Creat: 0.67 mg/dL (ref 0.50–1.10)
GFR, Est African American: >89 mL/min
GFR, Est Non African American: >89 mL/min
Glucose, Bld: 82 mg/dL (ref 70–99)
POTASSIUM: 4.1 meq/L (ref 3.5–5.3)
SODIUM: 137 meq/L (ref 135–145)

## 2014-04-30 LAB — LIPID PANEL
CHOLESTEROL: 227 mg/dL — AB (ref 0–200)
HDL: 30 mg/dL — ABNORMAL LOW (ref >=46)
LDL CALC: 157 mg/dL — AB (ref 0–99)
Total CHOL/HDL Ratio: 7.6 Ratio
Triglycerides: 201 mg/dL — ABNORMAL HIGH (ref <150)
VLDL: 40 mg/dL (ref 0–40)

## 2014-04-30 LAB — MAGNESIUM: Magnesium: 2.1 mg/dL (ref 1.5–2.5)

## 2014-04-30 LAB — HEPATIC FUNCTION PANEL
ALT: 41 U/L — AB (ref 0–35)
AST: 45 U/L — ABNORMAL HIGH (ref 0–37)
Albumin: 4.6 g/dL (ref 3.5–5.2)
Alkaline Phosphatase: 77 U/L (ref 39–117)
BILIRUBIN DIRECT: 0.1 mg/dL (ref 0.0–0.3)
BILIRUBIN TOTAL: 0.7 mg/dL (ref 0.2–1.2)
Indirect Bilirubin: 0.6 mg/dL (ref 0.2–1.2)
Total Protein: 7.9 g/dL (ref 6.0–8.3)

## 2014-04-30 LAB — TSH: TSH: 2.844 u[IU]/mL (ref 0.350–4.500)

## 2014-04-30 LAB — VITAMIN D 25 HYDROXY (VIT D DEFICIENCY, FRACTURES): Vit D, 25-Hydroxy: 43 ng/mL (ref 30–100)

## 2014-04-30 LAB — INSULIN, FASTING: INSULIN FASTING, SERUM: 27.4 u[IU]/mL — AB (ref 2.0–19.6)

## 2014-04-30 LAB — HEMOGLOBIN A1C
HEMOGLOBIN A1C: 5.9 % — AB (ref <5.7)
Mean Plasma Glucose: 123 mg/dL — ABNORMAL HIGH (ref <117)

## 2014-11-18 ENCOUNTER — Encounter: Payer: Self-pay | Admitting: Physician Assistant

## 2015-01-03 ENCOUNTER — Encounter: Payer: Self-pay | Admitting: Physician Assistant

## 2015-01-17 ENCOUNTER — Ambulatory Visit (INDEPENDENT_AMBULATORY_CARE_PROVIDER_SITE_OTHER): Payer: Medicare Other | Admitting: Physician Assistant

## 2015-01-17 ENCOUNTER — Encounter: Payer: Self-pay | Admitting: Physician Assistant

## 2015-01-17 VITALS — BP 142/80 | HR 86 | Temp 97.9°F | Resp 14 | Ht 64.0 in | Wt 166.0 lb

## 2015-01-17 DIAGNOSIS — K2100 Gastro-esophageal reflux disease with esophagitis, without bleeding: Secondary | ICD-10-CM

## 2015-01-17 DIAGNOSIS — Z0000 Encounter for general adult medical examination without abnormal findings: Secondary | ICD-10-CM | POA: Diagnosis not present

## 2015-01-17 DIAGNOSIS — R7989 Other specified abnormal findings of blood chemistry: Secondary | ICD-10-CM

## 2015-01-17 DIAGNOSIS — I10 Essential (primary) hypertension: Secondary | ICD-10-CM | POA: Diagnosis not present

## 2015-01-17 DIAGNOSIS — R809 Proteinuria, unspecified: Secondary | ICD-10-CM

## 2015-01-17 DIAGNOSIS — E782 Mixed hyperlipidemia: Secondary | ICD-10-CM

## 2015-01-17 DIAGNOSIS — Z91199 Patient's noncompliance with other medical treatment and regimen due to unspecified reason: Secondary | ICD-10-CM

## 2015-01-17 DIAGNOSIS — R945 Abnormal results of liver function studies: Secondary | ICD-10-CM

## 2015-01-17 DIAGNOSIS — Z79899 Other long term (current) drug therapy: Secondary | ICD-10-CM

## 2015-01-17 DIAGNOSIS — E559 Vitamin D deficiency, unspecified: Secondary | ICD-10-CM

## 2015-01-17 DIAGNOSIS — Z0001 Encounter for general adult medical examination with abnormal findings: Secondary | ICD-10-CM

## 2015-01-17 DIAGNOSIS — R7303 Prediabetes: Secondary | ICD-10-CM

## 2015-01-17 DIAGNOSIS — Z9119 Patient's noncompliance with other medical treatment and regimen: Secondary | ICD-10-CM

## 2015-01-17 DIAGNOSIS — R0989 Other specified symptoms and signs involving the circulatory and respiratory systems: Secondary | ICD-10-CM

## 2015-01-17 LAB — CBC WITH DIFFERENTIAL/PLATELET
BASOS PCT: 0 % (ref 0–1)
Basophils Absolute: 0 10*3/uL (ref 0.0–0.1)
EOS PCT: 2 % (ref 0–5)
Eosinophils Absolute: 0.2 10*3/uL (ref 0.0–0.7)
HCT: 42.8 % (ref 36.0–46.0)
Hemoglobin: 14.2 g/dL (ref 12.0–15.0)
LYMPHS PCT: 24 % (ref 12–46)
Lymphs Abs: 2.5 10*3/uL (ref 0.7–4.0)
MCH: 30 pg (ref 26.0–34.0)
MCHC: 33.2 g/dL (ref 30.0–36.0)
MCV: 90.5 fL (ref 78.0–100.0)
MONO ABS: 0.8 10*3/uL (ref 0.1–1.0)
MPV: 10.1 fL (ref 8.6–12.4)
Monocytes Relative: 8 % (ref 3–12)
Neutro Abs: 6.9 10*3/uL (ref 1.7–7.7)
Neutrophils Relative %: 66 % (ref 43–77)
PLATELETS: 264 10*3/uL (ref 150–400)
RBC: 4.73 MIL/uL (ref 3.87–5.11)
RDW: 13.8 % (ref 11.5–15.5)
WBC: 10.5 10*3/uL (ref 4.0–10.5)

## 2015-01-17 LAB — HEMOGLOBIN A1C
HEMOGLOBIN A1C: 5.8 % — AB (ref <5.7)
MEAN PLASMA GLUCOSE: 120 mg/dL — AB (ref <117)

## 2015-01-17 NOTE — Progress Notes (Signed)
Complete Physical  Assessment and Plan: 1. Hypertension - continue medications, DASH diet, exercise and monitor at home. Call if greater than 130/80.  - CBC with Differential/Platelet - BASIC METABOLIC PANEL WITH GFR - Hepatic function panel - TSH - Urinalysis, Routine w reflex microscopic (not at [facility]) - Microalbumin / creatinine urine ratio - EKG 12-Lead  2. Hyperlipidemia -continue medications, check lipids, decrease fatty foods, increase activity.  - Lipid panel  3. Prediabetes Discussed general issues about diabetes pathophysiology and management., Educational material distributed., Suggested low cholesterol diet., Encouraged aerobic exercise., Discussed foot care., Reminded to get yearly retinal exam. - Hemoglobin A1c - Insulin, fasting  4. Vitamin D deficiency - VITAMIN D 25 Hydroxy (Vit-D Deficiency, Fractures)  5. GERD Continue PPI/H2 blocker, diet discussed  6. Medication management - Magnesium  7. Poor compliance with medical recommendations Declines vaccinations  8. Encounter for general adult medical examination with abnormal findings Get MGM, not willing to do colonoscopy, will do cologuard.  - CBC with Differential/Platelet - BASIC METABOLIC PANEL WITH GFR - Hepatic function panel - TSH - Lipid panel - Hemoglobin A1c - Insulin, fasting - Magnesium - VITAMIN D 25 Hydroxy (Vit-D Deficiency, Fractures) - Urinalysis, Routine w reflex microscopic (not at [facility]) - Microalbumin / creatinine urine ratio - EKG 12-Lead  Discussed med's effects and SE's. Screening labs and tests as requested with regular follow-up as recommended. Over 40 minutes of exam, counseling, chart review, and complex, high level critical decision making was performed this visit.   HPI  69 y.o. female  presents for a complete physical.  Her blood pressure has been controlled at home, she is controlling diet/exercise, today their BP is BP: (!) 142/80 mmHg She does workout. She denies  chest pain, shortness of breath, dizziness.  She is not on cholesterol medication and denies myalgias. Her cholesterol is not at goal. The cholesterol last visit was:   Lab Results  Component Value Date   CHOL 227* 04/29/2014   HDL 30* 04/29/2014   LDLCALC 157* 04/29/2014   TRIG 201* 04/29/2014   CHOLHDL 7.6 04/29/2014   She has been working on diet and exercise for prediabetes, and denies paresthesia of the feet, polydipsia, polyuria and visual disturbances. Last A1C in the office was:  Lab Results  Component Value Date   HGBA1C 5.9* 04/29/2014  Patient is on Vitamin D supplement.   Lab Results  Component Value Date   VD25OH 86 04/29/2014  Retired Pharmacist, hospital.  She has GERD but tries diet and not eating before bed which helps. Also has history of hemorrhoids and will occ feel like she has UTI from this, states it is not painful, she uses tucks and witch hazel, will have to take a long time to get clean.  Has had pneumonia 3 times after procedures and would prefer to avoid colonoscopy.  She states she normally sleeps well but the last 2 months she has been waking up in the night. Some to urinate, will check urine, unknown if she snores or not. Does not want medication.   BMI is Body mass index is 28.48 kg/(m^2)., she is working on diet and exercise. Wt Readings from Last 3 Encounters:  01/17/15 166 lb (75.297 kg)  04/29/14 166 lb (75.297 kg)  01/12/14 165 lb (74.844 kg)    Current Medications:  Current Outpatient Prescriptions on File Prior to Visit  Medication Sig Dispense Refill  . Acetaminophen (TYLENOL EXTRA STRENGTH PO) Take by mouth. PRN    . Cholecalciferol (VITAMIN D PO)  Take 5,000 Units by mouth daily.     . Probiotic Product (PROBIOTIC PO) Take by mouth daily.     No current facility-administered medications on file prior to visit.   Health Maintenance:   Immunization History  Administered Date(s) Administered  . Pneumococcal-Unspecified 03/05/1997  . Td 03/05/1998     Last colonoscopy: 2008 DUE for this and would like to do cologuard instead but declines until next year. Last mammogram: 07/2013 normal, DUE Last pap smear/pelvic exam: 2010, declines another DEXA: 6-7 years, normal, declines another Hep panel neg 2013 [place] AB 2013, + gallstone US neck 2013  Prior vaccinations: TD or Tdap: 2000, will check Influenza: declines Pneumococcal: 1999 Prevnar 13: declines Shingles/Zostavax: declines  Last Dental Exam: Dr. Gloriann Loan, Dec 2015 Last Eye Exam: Dr. Delman Cheadle 12/2013 Patient Care Team: Unk Pinto, MD as PCP - General (Internal Medicine) Sharyne Peach, MD as Consulting Physician (Ophthalmology) Earley Favor (Dentistry) Lafayette Dragon, MD as Consulting Physician (Gastroenterology)  Allergies:  Allergies  Allergen Reactions  . Ampicillin Hives and Swelling  . Aspirin Itching  . Doxycycline Itching and Swelling  . Macrodantin [Nitrofurantoin Macrocrystal] Hives, Itching and Swelling  . Percocet [Oxycodone-Acetaminophen]   . Propulsid [Cisapride] Itching and Swelling  . Voltaren [Diclofenac] Hives and Itching  . Codeine Palpitations  . Penicillins Swelling and Rash   Medical History:  Past Medical History  Diagnosis Date  . Essential tremor   . GERD (gastroesophageal reflux disease)   . Heart murmur   . High cholesterol   . Hypertension     labile  . Prediabetes   . Labile hypertension   . Hyperlipidemia    Surgical History:  Past Surgical History  Procedure Laterality Date  . Back surgery     Family History:  Family History  Problem Relation Age of Onset  . Lymphoma Mother   . Heart attack Father    Social History:  Social History  Substance Use Topics  . Smoking status: Never Smoker   . Smokeless tobacco: None  . Alcohol Use: No    Review of Systems: Review of Systems  Constitutional: Negative.   HENT: Negative.   Eyes: Negative.   Respiratory: Negative.  Negative for shortness of breath.    Cardiovascular: Negative.  Negative for chest pain.  Gastrointestinal: Negative.        + hemorrhoid  Genitourinary: Negative.   Musculoskeletal: Negative.  Negative for falls.  Skin: Negative.   Neurological: Negative.   Psychiatric/Behavioral: Negative.  Negative for depression.    Physical Exam: Estimated body mass index is 28.48 kg/(m^2) as calculated from the following:   Height as of this encounter: 5' 4"  (1.626 m).   Weight as of this encounter: 166 lb (75.297 kg). BP 142/80 mmHg  Pulse 86  Temp(Src) 97.9 F (36.6 C) (Temporal)  Resp 14  Ht 5' 4"  (1.626 m)  Wt 166 lb (75.297 kg)  BMI 28.48 kg/m2  SpO2 96% General Appearance: Well nourished, in no apparent distress.  Eyes: PERRLA, EOMs, conjunctiva no swelling or erythema, normal fundi and vessels.  Sinuses: No Frontal/maxillary tenderness  ENT/Mouth: Ext aud canals clear, normal light reflex with TMs without erythema, bulging. Good dentition. No erythema, swelling, or exudate on post pharynx. Tonsils not swollen or erythematous. Hearing normal.  Neck: Supple, thyroid normal. No bruits  Respiratory: Respiratory effort normal, BS equal bilaterally without rales, rhonchi, wheezing or stridor.  Cardio: RRR without murmurs, rubs or gallops. Brisk peripheral pulses without edema.  Chest: symmetric, with normal excursions  and percussion.  Breasts: Symmetric, without lumps, nipple discharge, retractions.  Abdomen: Soft, nontender, no guarding, rebound, hernias, masses, or organomegaly.  Lymphatics: Non tender without lymphadenopathy.  Genitourinary: defer Rectal exam: thrombosed external hemorrhoids noted 4 o'clock, sphincter tone normal, stool guaiac negative. Musculoskeletal: Full ROM all peripheral extremities,5/5 strength, and normal gait.  Skin: Warm, dry without rashes, lesions, ecchymosis. Neuro: Cranial nerves intact, reflexes equal bilaterally. Normal muscle tone, no cerebellar symptoms. Sensation intact.  Psych:  Awake and oriented X 3, normal affect, Insight and Judgment appropriate.   EKG: WNL no ST changes. AORTA SCAN: defer  Vicie Mutters 9:19 AM [facility]

## 2015-01-17 NOTE — Patient Instructions (Addendum)
Benefiber is good for constipation/diarrhea/irritable bowel syndrome, it helps with weight loss and can help lower your bad cholesterol. Please do 1-2 TBSP in the morning in water, coffee, or tea. It can take up to a month before you can see a difference with your bowel movements. It is cheapest from [employer], [employer], [employer].    About Hemorrhoids  Hemorrhoids are swollen veins in the lower rectum and anus.  Also called piles, hemorrhoids are a common problem.  Hemorrhoids may be internal (inside the rectum) or external (around the anus).  Internal Hemorrhoids  Internal hemorrhoids are often painless, but they rarely cause bleeding.  The internal veins may stretch and fall down (prolapse) through the anus to the outside of the body.  The veins may then become irritated and painful.  External Hemorrhoids  External hemorrhoids can be easily seen or felt around the anal opening.  They are under the skin around the anus.  When the swollen veins are scratched or broken by straining, rubbing or wiping they sometimes bleed.  How Hemorrhoids Occur  Veins in the rectum and around the anus tend to swell under pressure.  Hemorrhoids can result from increased pressure in the veins of your anus or rectum.  Some sources of pressure are:   Straining to have a bowel movement because of constipation  Waiting too long to have a bowel movement  Coughing and sneezing often  Sitting for extended periods of time, including on the toilet  Diarrhea  Obesity  Trauma or injury to the anus  Some liver diseases  Stress  Family history of hemorrhoids  Pregnancy  Pregnant women should try to avoid becoming constipated, because they are more likely to have hemorrhoids during pregnancy.  In the last trimester of pregnancy, the enlarged uterus may press on blood vessels and causes hemorrhoids.  In addition, the strain of childbirth sometimes causes hemorrhoids after the birth.  Symptoms of Hemorrhoids  Some  symptoms of hemorrhoids include:  Swelling and/or a tender lump around the anus  Itching, mild burning and bleeding around the anus  Painful bowel movements with or without constipation  Bright red blood covering the stool, on toilet paper or in the toilet bowel.   Symptoms usually go away within a few days.  Always talk to your doctor about any bleeding to make sure it is not from some other causes.  Diagnosing and Treating Hemorrhoids  Diagnosis is made by an examination by your healthcare provider.  Special test can be performed by your doctor.    Most cases of hemorrhoids can be treated with:  High-fiber diet: Eat more high-fiber foods, which help prevent constipation.  Ask for more detailed fiber information on types and sources of fiber from your healthcare provider.  Fluids: Drink plenty of water.  This helps soften bowel movements so they are easier to pass.  Sitz baths and cold packs: Sitting in lukewarm water two or three times a day for 15 minutes cleases the anal area and may relieve discomfort.  If the water is too hot, swelling around the anus will get worse.  Placing a cloth-covered ice pack on the anus for ten minutes four times a day can also help reduce selling.  Gently pushing a prolapsed hemorrhoid back inside after the bath or ice pack can be helpful.  Medications: For mild discomfort, your healthcare provider may suggest over-the-counter pain medication or prescribe a cream or ointment for topical use.  The cream may contain witch hazel, zinc oxide or  petroleum jelly.  Medicated suppositories are also a treatment option.  Always consult your doctor before applying medications or creams.  Procedures and surgeries: There are also a number of procedures and surgeries to shrink or remove hemorrhoids in more serious cases.  Talk to your physician about these options.  You can often prevent hemorrhoids or keep them from becoming worse by maintaining a healthy lifestyle.   Eat a fiber-rich diet of fruits, vegetables and whole grains.  Also, drink plenty of water and exercise regularly.   2007, [employer] with no obligation # [phone] Do not have to be a member Tues-Sat 10-6  [place]- free test with no obligation # [phone] MUST BE A MEMBER Call for store hours  Preventive Care for Adults A healthy lifestyle and preventive care can promote health and wellness. Preventive health guidelines for women include the following key practices.  A routine yearly physical is a good way to check with your health care provider about your health and preventive screening. It is a chance to share any concerns and updates on your health and to receive a thorough exam.  Visit your dentist for a routine exam and preventive care every 6 months. Brush your teeth twice a day and floss once a day. Good oral hygiene prevents tooth decay and gum disease.  The frequency of eye exams is based on your age, health, family medical history, use of contact lenses, and other factors. Follow your health care provider's recommendations for frequency of eye exams.  Eat a healthy diet. Foods like vegetables, fruits, whole grains, low-fat dairy products, and lean protein foods contain the nutrients you need without too many calories. Decrease your intake of foods high in solid fats, added sugars, and salt. Eat the right amount of calories for you.Get information about a proper diet from your health care provider, if necessary.  Regular physical exercise is one of the most important things you can do for your health. Most adults should get at least 150 minutes of moderate-intensity exercise (any activity that increases your heart rate and causes you to sweat) each week. In addition, most adults need muscle-strengthening exercises on 2 or more days a week.  Maintain a healthy weight. The body mass index (BMI) is a screening  tool to identify possible weight problems. It provides an estimate of body fat based on height and weight. Your health care provider can find your BMI and can help you achieve or maintain a healthy weight.For adults 20 years and older:  A BMI below 18.5 is considered underweight.  A BMI of 18.5 to 24.9 is normal.  A BMI of 25 to 29.9 is considered overweight.  A BMI of 30 and above is considered obese.  Maintain normal blood lipids and cholesterol levels by exercising and minimizing your intake of saturated fat. Eat a balanced diet with plenty of fruit and vegetables. If your lipid or cholesterol levels are high, you are over 50, or you are at high risk for heart disease, you may need your cholesterol levels checked more frequently.Ongoing high lipid and cholesterol levels should be treated with medicines if diet and exercise are not working.  If you smoke, find out from your health care provider how to quit. If you do not use tobacco, do not start.  Lung cancer screening is recommended for adults aged 51-80 years who are at high risk for developing lung cancer because of a history of smoking.  A yearly low-dose CT scan of the lungs is recommended for people who have at least a 30-pack-year history of smoking and are a current smoker or have quit within the past 15 years. A pack year of smoking is smoking an average of 1 pack of cigarettes a day for 1 year (for example: 1 pack a day for 30 years or 2 packs a day for 15 years). Yearly screening should continue until the smoker has stopped smoking for at least 15 years. Yearly screening should be stopped for people who develop a health problem that would prevent them from having lung cancer treatment.  Avoid use of street drugs. Do not share needles with anyone. Ask for help if you need support or instructions about stopping the use of drugs.  High blood pressure causes heart disease and increases the risk of stroke.  Ongoing high blood pressure  should be treated with medicines if weight loss and exercise do not work.  If you are 70-22 years old, ask your health care provider if you should take aspirin to prevent strokes.  Diabetes screening involves taking a blood sample to check your fasting blood sugar level. This should be done once every 3 years, after age 58, if you are within normal weight and without risk factors for diabetes. Testing should be considered at a younger age or be carried out more frequently if you are overweight and have at least 1 risk factor for diabetes.  Breast cancer screening is essential preventive care for women. You should practice "breast self-awareness." This means understanding the normal appearance and feel of your breasts and may include breast self-examination. Any changes detected, no matter how small, should be reported to a health care provider. Women in their 4s and 30s should have a clinical breast exam (CBE) by a health care provider as part of a regular health exam every 1 to 3 years. After age 76, women should have a CBE every year. Starting at age 63, women should consider having a mammogram (breast X-ray test) every year. Women who have a family history of breast cancer should talk to their health care provider about genetic screening. Women at a high risk of breast cancer should talk to their health care providers about having an MRI and a mammogram every year.  Breast cancer gene (BRCA)-related cancer risk assessment is recommended for women who have family members with BRCA-related cancers. BRCA-related cancers include breast, ovarian, tubal, and peritoneal cancers. Having family members with these cancers may be associated with an increased risk for harmful changes (mutations) in the breast cancer genes BRCA1 and BRCA2. Results of the assessment will determine the need for genetic counseling and BRCA1 and BRCA2 testing.  Routine pelvic exams to screen for cancer are no longer recommended for  nonpregnant women who are considered low risk for cancer of the pelvic organs (ovaries, uterus, and vagina) and who do not have symptoms. Ask your health care provider if a screening pelvic exam is right for you.  If you have had past treatment for cervical cancer or a condition that could lead to cancer, you need Pap tests and screening for cancer for at least 20 years after your treatment. If Pap tests have been discontinued, your risk factors (such as having a new sexual partner) need to be reassessed to determine if screening should be resumed. Some women have medical problems that increase the chance of getting cervical cancer. In these cases, your health care provider may recommend more frequent screening  and Pap tests.    Colorectal cancer can be detected and often prevented. Most routine colorectal cancer screening begins at the age of 38 years and continues through age 73 years. However, your health care provider may recommend screening at an earlier age if you have risk factors for colon cancer. On a yearly basis, your health care provider may provide home test kits to check for hidden blood in the stool. Use of a small camera at the end of a tube, to directly examine the colon (sigmoidoscopy or colonoscopy), can detect the earliest forms of colorectal cancer. Talk to your health care provider about this at age 80, when routine screening begins. Direct exam of the colon should be repeated every 5-10 years through age 34 years, unless early forms of pre-cancerous polyps or small growths are found.  Osteoporosis is a disease in which the bones lose minerals and strength with aging. This can result in serious bone fractures or breaks. The risk of osteoporosis can be identified using a bone density scan. Women ages 66 years and over and women at risk for fractures or osteoporosis should discuss screening with their health care providers. Ask your health care provider whether you should take a calcium  supplement or vitamin D to reduce the rate of osteoporosis.  Menopause can be associated with physical symptoms and risks. Hormone replacement therapy is available to decrease symptoms and risks. You should talk to your health care provider about whether hormone replacement therapy is right for you.  Use sunscreen. Apply sunscreen liberally and repeatedly throughout the day. You should seek shade when your shadow is shorter than you. Protect yourself by wearing long sleeves, pants, a wide-brimmed hat, and sunglasses year round, whenever you are outdoors.  Once a month, do a whole body skin exam, using a mirror to look at the skin on your back. Tell your health care provider of new moles, moles that have irregular borders, moles that are larger than a pencil eraser, or moles that have changed in shape or color.  Stay current with required vaccines (immunizations).  Influenza vaccine. All adults should be immunized every year.  Tetanus, diphtheria, and acellular pertussis (Td, Tdap) vaccine. Pregnant women should receive 1 dose of Tdap vaccine during each pregnancy. The dose should be obtained regardless of the length of time since the last dose. Immunization is preferred during the 27th-36th week of gestation. An adult who has not previously received Tdap or who does not know her vaccine status should receive 1 dose of Tdap. This initial dose should be followed by tetanus and diphtheria toxoids (Td) booster doses every 10 years. Adults with an unknown or incomplete history of completing a 3-dose immunization series with Td-containing vaccines should begin or complete a primary immunization series including a Tdap dose. Adults should receive a Td booster every 10 years.    Zoster vaccine. One dose is recommended for adults aged 31 years or older unless certain conditions are present.    Pneumococcal 13-valent conjugate (PCV13) vaccine. When indicated, a person who is uncertain of her immunization  history and has no record of immunization should receive the PCV13 vaccine. An adult aged 37 years or older who has certain medical conditions and has not been previously immunized should receive 1 dose of PCV13 vaccine. This PCV13 should be followed with a dose of pneumococcal polysaccharide (PPSV23) vaccine. The PPSV23 vaccine dose should be obtained at least 8 weeks after the dose of PCV13 vaccine. An adult aged 31 years or  older who has certain medical conditions and previously received 1 or more doses of PPSV23 vaccine should receive 1 dose of PCV13. The PCV13 vaccine dose should be obtained 1 or more years after the last PPSV23 vaccine dose.    Pneumococcal polysaccharide (PPSV23) vaccine. When PCV13 is also indicated, PCV13 should be obtained first. All adults aged 19 years and older should be immunized. An adult younger than age 58 years who has certain medical conditions should be immunized. Any person who resides in a nursing home or long-term care facility should be immunized. An adult smoker should be immunized. People with an immunocompromised condition and certain other conditions should receive both PCV13 and PPSV23 vaccines. People with human immunodeficiency virus (HIV) infection should be immunized as soon as possible after diagnosis. Immunization during chemotherapy or radiation therapy should be avoided. Routine use of PPSV23 vaccine is not recommended for American Indians, [place], or people younger than 65 years unless there are medical conditions that require PPSV23 vaccine. When indicated, people who have unknown immunization and have no record of immunization should receive PPSV23 vaccine. One-time revaccination 5 years after the first dose of PPSV23 is recommended for people aged 19-64 years who have chronic kidney failure, nephrotic syndrome, asplenia, or immunocompromised conditions. People who received 1-2 doses of PPSV23 before age 91 years should receive another dose of  PPSV23 vaccine at age 24 years or later if at least 5 years have passed since the previous dose. Doses of PPSV23 are not needed for people immunized with PPSV23 at or after age 77 years.   Preventive Services / Frequency  Ages 56 years and over  Blood pressure check.  Lipid and cholesterol check.  Lung cancer screening. / Every year if you are aged 11-80 years and have a 30-pack-year history of smoking and currently smoke or have quit within the past 15 years. Yearly screening is stopped once you have quit smoking for at least 15 years or develop a health problem that would prevent you from having lung cancer treatment.  Clinical breast exam.** / Every year after age 59 years.  BRCA-related cancer risk assessment.** / For women who have family members with a BRCA-related cancer (breast, ovarian, tubal, or peritoneal cancers).  Mammogram.** / Every year beginning at age 58 years and continuing for as long as you are in good health. Consult with your health care provider.  Pap test.** / Every 3 years starting at age 59 years through age 46 or 32 years with 3 consecutive normal Pap tests. Testing can be stopped between 65 and 70 years with 3 consecutive normal Pap tests and no abnormal Pap or HPV tests in the past 10 years.  Fecal occult blood test (FOBT) of stool. / Every year beginning at age 60 years and continuing until age 49 years. You may not need to do this test if you get a colonoscopy every 10 years.  Flexible sigmoidoscopy or colonoscopy.** / Every 5 years for a flexible sigmoidoscopy or every 10 years for a colonoscopy beginning at age 81 years and continuing until age 2 years.  Hepatitis C blood test.** / For all people born from [birth year] through 1965 and any individual with known risks for hepatitis C.  Osteoporosis screening.** / A one-time screening for women ages 23 years and over and women at risk for fractures or osteoporosis.  Skin self-exam. / Monthly.  Influenza  vaccine. / Every year.  Tetanus, diphtheria, and acellular pertussis (Tdap/Td) vaccine.** / 1 dose of Td every  10 years.  Zoster vaccine.** / 1 dose for adults aged 64 years or older.  Pneumococcal 13-valent conjugate (PCV13) vaccine.** / Consult your health care provider.  Pneumococcal polysaccharide (PPSV23) vaccine.** / 1 dose for all adults aged 69 years and older. Screening for abdominal aortic aneurysm (AAA)  by ultrasound is recommended for people who have history of high blood pressure or who are current or former smokers.

## 2015-01-18 LAB — URINALYSIS, ROUTINE W REFLEX MICROSCOPIC
BILIRUBIN URINE: NEGATIVE
Glucose, UA: NEGATIVE
Hgb urine dipstick: NEGATIVE
Ketones, ur: NEGATIVE
LEUKOCYTES UA: NEGATIVE
Nitrite: NEGATIVE
PROTEIN: NEGATIVE
SPECIFIC GRAVITY, URINE: 1.006 (ref 1.001–1.035)
pH: 6 (ref 5.0–8.0)

## 2015-01-18 LAB — BASIC METABOLIC PANEL WITH GFR
BUN: 8 mg/dL (ref 7–25)
CALCIUM: 9.5 mg/dL (ref 8.6–10.4)
CO2: 28 mmol/L (ref 20–31)
Chloride: 101 mmol/L (ref 98–110)
Creat: 0.66 mg/dL (ref 0.50–0.99)
GFR, Est African American: >89 mL/min (ref >=60)
GFR, Est Non African American: >89 mL/min (ref >=60)
Glucose, Bld: 96 mg/dL (ref 65–99)
Potassium: 3.9 mmol/L (ref 3.5–5.3)
SODIUM: 138 mmol/L (ref 135–146)

## 2015-01-18 LAB — LIPID PANEL
CHOL/HDL RATIO: 6.1 ratio — AB (ref <=5.0)
CHOLESTEROL: 225 mg/dL — AB (ref 125–200)
HDL: 37 mg/dL — ABNORMAL LOW (ref >=46)
LDL Cholesterol: 160 mg/dL — ABNORMAL HIGH (ref <130)
Triglycerides: 141 mg/dL (ref <150)
VLDL: 28 mg/dL (ref <30)

## 2015-01-18 LAB — MICROALBUMIN / CREATININE URINE RATIO
CREATININE, URINE: 18 mg/dL — AB (ref 20–320)
MICROALB UR: 0.6 mg/dL
MICROALB/CREAT RATIO: 33 ug/mg{creat} — AB (ref <30)

## 2015-01-18 LAB — HEPATIC FUNCTION PANEL
ALT: 70 U/L — AB (ref 6–29)
AST: 82 U/L — AB (ref 10–35)
Albumin: 4.6 g/dL (ref 3.6–5.1)
Alkaline Phosphatase: 80 U/L (ref 33–130)
BILIRUBIN DIRECT: 0.1 mg/dL (ref <=0.2)
BILIRUBIN TOTAL: 0.8 mg/dL (ref 0.2–1.2)
Indirect Bilirubin: 0.7 mg/dL (ref 0.2–1.2)
Total Protein: 7.8 g/dL (ref 6.1–8.1)

## 2015-01-18 LAB — MAGNESIUM: Magnesium: 2 mg/dL (ref 1.5–2.5)

## 2015-01-18 LAB — TSH: TSH: 2.968 u[IU]/mL (ref 0.350–4.500)

## 2015-01-18 LAB — VITAMIN D 25 HYDROXY (VIT D DEFICIENCY, FRACTURES): VIT D 25 HYDROXY: 39 ng/mL (ref 30–100)

## 2015-01-18 LAB — INSULIN, FASTING: Insulin fasting, serum: 17.1 u[IU]/mL (ref 2.0–19.6)

## 2015-01-19 NOTE — Addendum Note (Signed)
Addended by: Vicie Mutters R on: 01/19/2015 07:27 AM   Modules accepted: Orders, SmartSet

## 2015-03-15 ENCOUNTER — Ambulatory Visit: Payer: Self-pay | Admitting: Physician Assistant

## 2015-03-17 ENCOUNTER — Encounter: Payer: Self-pay | Admitting: Physician Assistant

## 2015-03-17 ENCOUNTER — Ambulatory Visit (INDEPENDENT_AMBULATORY_CARE_PROVIDER_SITE_OTHER): Payer: Medicare Other | Admitting: Physician Assistant

## 2015-03-17 VITALS — BP 140/80 | HR 77 | Temp 97.7°F | Ht 64.0 in | Wt 164.0 lb

## 2015-03-17 DIAGNOSIS — Z0001 Encounter for general adult medical examination with abnormal findings: Secondary | ICD-10-CM

## 2015-03-17 DIAGNOSIS — Z0000 Encounter for general adult medical examination without abnormal findings: Secondary | ICD-10-CM

## 2015-03-17 DIAGNOSIS — Z9119 Patient's noncompliance with other medical treatment and regimen: Secondary | ICD-10-CM

## 2015-03-17 DIAGNOSIS — R0989 Other specified symptoms and signs involving the circulatory and respiratory systems: Secondary | ICD-10-CM

## 2015-03-17 DIAGNOSIS — E559 Vitamin D deficiency, unspecified: Secondary | ICD-10-CM

## 2015-03-17 DIAGNOSIS — I10 Essential (primary) hypertension: Secondary | ICD-10-CM

## 2015-03-17 DIAGNOSIS — R6889 Other general symptoms and signs: Secondary | ICD-10-CM | POA: Diagnosis not present

## 2015-03-17 DIAGNOSIS — E782 Mixed hyperlipidemia: Secondary | ICD-10-CM

## 2015-03-17 DIAGNOSIS — R945 Abnormal results of liver function studies: Secondary | ICD-10-CM

## 2015-03-17 DIAGNOSIS — R7303 Prediabetes: Secondary | ICD-10-CM | POA: Diagnosis not present

## 2015-03-17 DIAGNOSIS — K2100 Gastro-esophageal reflux disease with esophagitis, without bleeding: Secondary | ICD-10-CM

## 2015-03-17 DIAGNOSIS — Z91199 Patient's noncompliance with other medical treatment and regimen due to unspecified reason: Secondary | ICD-10-CM

## 2015-03-17 DIAGNOSIS — Z79899 Other long term (current) drug therapy: Secondary | ICD-10-CM | POA: Diagnosis not present

## 2015-03-17 DIAGNOSIS — D649 Anemia, unspecified: Secondary | ICD-10-CM

## 2015-03-17 DIAGNOSIS — R7989 Other specified abnormal findings of blood chemistry: Secondary | ICD-10-CM

## 2015-03-17 DIAGNOSIS — R809 Proteinuria, unspecified: Secondary | ICD-10-CM | POA: Diagnosis not present

## 2015-03-17 LAB — IRON AND TIBC
%SAT: 44 % (ref 11–50)
IRON: 154 ug/dL (ref 45–160)
TIBC: 349 ug/dL (ref 250–450)
UIBC: 195 ug/dL (ref 125–400)

## 2015-03-17 LAB — HEPATIC FUNCTION PANEL
ALBUMIN: 4.7 g/dL (ref 3.6–5.1)
ALK PHOS: 77 U/L (ref 33–130)
ALT: 81 U/L — ABNORMAL HIGH (ref 6–29)
AST: 85 U/L — AB (ref 10–35)
BILIRUBIN TOTAL: 0.7 mg/dL (ref 0.2–1.2)
Bilirubin, Direct: 0.1 mg/dL (ref <=0.2)
Indirect Bilirubin: 0.6 mg/dL (ref 0.2–1.2)
TOTAL PROTEIN: 7.7 g/dL (ref 6.1–8.1)

## 2015-03-17 LAB — CBC WITH DIFFERENTIAL/PLATELET
BASOS PCT: 1 % (ref 0–1)
Basophils Absolute: 0.1 10*3/uL (ref 0.0–0.1)
EOS ABS: 0.2 10*3/uL (ref 0.0–0.7)
Eosinophils Relative: 2 % (ref 0–5)
HCT: 42.5 % (ref 36.0–46.0)
HEMOGLOBIN: 14.7 g/dL (ref 12.0–15.0)
LYMPHS PCT: 24 % (ref 12–46)
Lymphs Abs: 2.6 10*3/uL (ref 0.7–4.0)
MCH: 31.2 pg (ref 26.0–34.0)
MCHC: 34.6 g/dL (ref 30.0–36.0)
MCV: 90.2 fL (ref 78.0–100.0)
MONO ABS: 1 10*3/uL (ref 0.1–1.0)
MONOS PCT: 9 % (ref 3–12)
MPV: 10 fL (ref 8.6–12.4)
NEUTROS ABS: 7 10*3/uL (ref 1.7–7.7)
Neutrophils Relative %: 64 % (ref 43–77)
PLATELETS: 260 10*3/uL (ref 150–400)
RBC: 4.71 MIL/uL (ref 3.87–5.11)
RDW: 13.6 % (ref 11.5–15.5)
WBC: 10.9 10*3/uL — ABNORMAL HIGH (ref 4.0–10.5)

## 2015-03-17 LAB — FERRITIN: Ferritin: 385 ng/mL — ABNORMAL HIGH (ref 10–291)

## 2015-03-17 NOTE — Patient Instructions (Addendum)
ACE/ARB and microalbuminuria or protein in your urine   [facility] Schedule an appointment by calling [phone].  Encourage you to get the 3D Mammogram  The 3D Mammogram is much more specific and sensitive to pick up breast cancer. For women with fibrocystic breast or lumpy breast it can be hard to determine if it is cancer or not but the 3D mammogram is able to tell this difference which cuts back on unneeded additional tests or scary call backs.   - over 40% increase in detection of breast cancer - over 40% reduction in false positives.  - fewer call backs - reduced anxiety - improved outcomes - [facility]   Vascular Dementia Vascular dementia is a common cause of dementia in the elderly. Dementia is a condition that affects the brain and causes people to not think well or act normally. Vascular dementia is one type of dementia. It occurs when blood clots block small blood vessels in the brain and destroy brain tissue. Likely risk factors are high blood pressure and advanced age. This disease can cause stroke, migraine-like headaches, and psychiatric disturbances.  SYMPTOMS   Confusion.  Problems with recent memory.  Wandering or getting lost in familiar places.  Loss of bladder or bowel control (incontinence).  Unsteady gait.  Poor attention and concentration.  Emotional problems such as laughing or crying inappropriately.  Difficulty following instructions.  Problems handling money.  Depression.  Difficulty planning ahead. Usually the damage is slight at first. Over time, as more small vessels are blocked, there is a gradual mental decline. However, symptoms may begin suddenly. Symptoms may be very similar to Alzheimer's disease. The two forms of dementia may occur together. Vascular dementia typically begins between the ages of 60 and 3.  TREATMENT   Currently there is no treatment for vascular dementia that can reverse the damage that has already  occurred.  Treatment focuses on prevention of additional brain damage and improvement of symptoms.  It is important to treat the risk factors for vascular dementia, such as keeping blood pressure under control, treating diabetes, lowering cholesterol, and stop smoking. PROGNOSIS   Prognosis for patients is generally poor. Individuals with the disease may improve for short periods of time, then get worse again. Early treatment and management of blood pressure and other risk factors may prevent further worsening of the disorder.   This information is not intended to replace advice given to you by your health care provider. Make sure you discuss any questions you have with your health care provider.   Document Released: 02/09/2002 Document Revised: 05/14/2011 Document Reviewed: 06/02/2014 Elsevier Interactive Patient Education [employer].

## 2015-03-17 NOTE — Progress Notes (Signed)
MEDICARE ANNUAL WELLNESS VISIT AND FOLLOW UP  Assessment:    1. Hypertension Declines medications at this time DASH diet and diet discussed If not better next OV willing to get on medicaitons Discussed MI, stroke, death, blindness, vascular dementia, kidney failure and patient states understands risk.  - CBC with Differential/Platelet  2. Prediabetes Discussed general issues about diabetes pathophysiology and management., Educational material distributed., Suggested low cholesterol diet., Encouraged aerobic exercise., Discussed foot care., Reminded to get yearly retinal exam.  3. Hyperlipidemia Declines STATIN Will work on strict diet/exercise Understands risk of MI, stroke, vascular dementia, death, etc  4. Poor compliance with medical recommendations Understands risk clearly  5. Vitamin D deficiency Continue supplement  6. Medication management  7. GERD Continue PPI/H2 blocker, diet discussed  8. Encounter for Medicare annual wellness exam Declines most vaccines, understands risk Will get MGM  9. Elevated LFTs No ETOH, no alcohol, no AB pain, benign AB Recheck, weight loss advised - Hepatic function panel  10. Microalbuminuria Long discussion about protein in urine, very resistant to medications, will try strict diet and exercise, and recheck next OV, if not better patient states willing to get on ACE/ARB Understands risk of kidney failure/progression  11. Anemia, unspecified anemia type - Iron and TIBC - Ferritin  Future Appointments Date Time Provider [place]  07/21/2015 10:30 AM Unk Pinto, MD [facility] None  01/18/2016 9:00 AM Vicie Mutters, PA-C [facility] None   Over 30 minutes of exam, counseling, chart review, and critical decision making was performed  Plan:   During the course of the visit the patient was educated and counseled about appropriate screening and preventive services including:    Pneumococcal vaccine   Influenza  vaccine  Td vaccine  Prevnar 13  Screening electrocardiogram  Screening mammography  Bone densitometry screening  Colorectal cancer screening  Diabetes screening  Glaucoma screening  Nutrition counseling   Advanced directives: given info/requested copies  Conditions/risks identified: Diabetes is at goal, ACE/ARB therapy: No, Reason not on Ace Inhibitor/ARB therapy:  PreDM Urinary Incontinence is not an issue: discussed non pharmacology and pharmacology options.  Fall risk: low- discussed PT, home fall assessment, medications.    Subjective:   Margaret Sullivan is a 70 y.o. female who presents for Medicare Annual Wellness Visit and 3 month follow up on hypertension, prediabetes, hyperlipidemia, vitamin D def.  Date of last medicare wellness visit was 04/2014.  Her blood pressure has not been controlled at home, today their BP is BP: (!) 148/96 mmHg  She did have microalbuminuria last visit, has not started medication.  She does workout, starting at [employer].  She denies chest pain, shortness of breath, dizziness.  She is not on cholesterol medication and denies myalgias. Her cholesterol is not at goal. The cholesterol last visit was:   Lab Results  Component Value Date   CHOL 225* 01/17/2015   HDL 37* 01/17/2015   LDLCALC 160* 01/17/2015   TRIG 141 01/17/2015   CHOLHDL 6.1* 01/17/2015   She has been working on diet and exercise for prediabetes, and denies paresthesia of the feet, polydipsia, polyuria and visual disturbances. Last A1C in the office was:  Lab Results  Component Value Date   HGBA1C 5.8* 01/17/2015  Patient is on Vitamin D supplement. Lab Results  Component Value Date   VD25OH 39 01/17/2015  Her LFTs were elevated last visit, she does not take tylenol, she does not drink, she had AB [place] that showed gallstone without inflammation and fatty liver.  Lab  Results  Component Value Date   ALT 70* 01/17/2015   AST 82* 01/17/2015   ALKPHOS 80 01/17/2015    BILITOT 0.8 01/17/2015   BMI is Body mass index is 28.14 kg/(m^2)., she is working on diet and exercise. Wt Readings from Last 3 Encounters:  03/17/15 164 lb (74.39 kg)  01/17/15 166 lb (75.297 kg)  04/29/14 166 lb (75.297 kg)        Medication Review Current Outpatient Prescriptions on File Prior to Visit  Medication Sig Dispense Refill  . Acetaminophen (TYLENOL EXTRA STRENGTH PO) Take by mouth. PRN    . Cholecalciferol (VITAMIN D PO) Take 5,000 Units by mouth daily.     . Probiotic Product (PROBIOTIC PO) Take by mouth daily.     No current facility-administered medications on file prior to visit.    Current Problems (verified) Patient Active Problem List   Diagnosis Date Noted  . Poor compliance with medical recommendations 04/29/2014  . Vitamin D deficiency 06/10/2013  . Medication management 06/10/2013  . GERD   . Hyperlipidemia   . Prediabetes   . Hypertension     Screening Tests Immunization History  Administered Date(s) Administered  . Pneumococcal-Unspecified 03/05/1997  . Td 03/05/1998    Preventative care: Last colonoscopy: 2008 DUE Last mammogram: 07/2013, will schedule Last pap smear/pelvic exam: 2010, declines another  DEXA:6 years ago, declines another Hep panel negative 2013  Prior vaccinations: TD or Tdap: 2000, declines  Influenza: declines  Pneumococcal: 1999 Prevnar13: declines Shingles/Zostavax: declines  Names of Other Physician/Practitioners you currently use: 1. [facility]- here for primary care 2. Dr. Delman Cheadle, eye doctor, last visit 12/2013.  3. Dr. Gloriann Loan, dentist, last visit 12/2013 Patient Care Team: Unk Pinto, MD as PCP - General (Internal Medicine) Sharyne Peach, MD as Consulting Physician (Ophthalmology) Earley Favor (Dentistry) Lafayette Dragon, MD as Consulting Physician (Gastroenterology)  Past Surgical History  Procedure Laterality Date  . Back surgery     Family History  Problem  Relation Age of Onset  . Lymphoma Mother   . Heart attack Father    Social History  Substance Use Topics  . Smoking status: Never Smoker   . Smokeless tobacco: None  . Alcohol Use: No    MEDICARE WELLNESS OBJECTIVES: Tobacco use: She does not smoke.  Patient is not a former smoker. If yes, counseling given Alcohol Current alcohol use: none Osteoporosis: postmenopausal estrogen deficiency and dietary calcium and/or vitamin D deficiency, History of fracture in the past year: no Fall risk: Low Risk Diet: in general, a "healthy" diet   Physical activity: Current Exercise Habits:: Home exercise routine, Type of exercise: yoga;walking, Time (Minutes): 20, Frequency (Times/Week): 3, Weekly Exercise (Minutes/Week): 60, Intensity: Mild Cardiac risk factors: Cardiac Risk Factors include: advanced age (>33mn, >>5women);family history of premature cardiovascular disease;dyslipidemia;hypertension;microalbuminuria;obesity (BMI >30kg/m2);sedentary lifestyle Depression/mood screen:   [facility]  Decreased Interest 0  Down, Depressed, Hopeless 0  PHQ - 2 Score 0    ADLs:  In your present state of health, do you have any difficulty performing the following activities: 03/17/2015 04/29/2014  Hearing? N N  Vision? Y N  Difficulty concentrating or making decisions? N N  Walking or climbing stairs? N N  Dressing or bathing? N N  Doing errands, shopping? N N  Preparing Food and eating ? N -  Using the Toilet? N -  In the past six months, have you accidently leaked urine? N -  Do you have problems  with loss of bowel control? N -  Managing your Medications? N -  Managing your Finances? N -  Housekeeping or managing your Housekeeping? N -     Cognitive Testing  Alert? Yes  Normal Appearance?Yes  Oriented to person? Yes  Place? Yes   Time? Yes  Recall of three objects?  Yes  Can perform simple calculations? Yes  Displays appropriate judgment?Yes  Can read the correct  time from a watch face?Yes  EOL planning: Does patient have an advance directive?: Yes Type of Advance Directive: Healthcare Power of Attorney, Living will Does patient want to make changes to advanced directive?: No - Patient declined Copy of advanced directive(s) in chart?: No - copy requested   Objective:   Today's Vitals   03/17/15 1118  BP: 148/96  Pulse: 77  Temp: 97.7 F (36.5 C)  TempSrc: Temporal  Height: 5' 4"  (1.626 m)  Weight: 164 lb (74.39 kg)  SpO2: 97%   Body mass index is 28.14 kg/(m^2).  General appearance: alert, no distress, WD/WN,  female HEENT: normocephalic, sclerae anicteric, TMs pearly, nares patent, no discharge or erythema, pharynx normal Oral cavity: MMM, no lesions Neck: supple, no lymphadenopathy, no thyromegaly, no masses Heart: RRR, normal S1, S2, no murmurs Lungs: CTA bilaterally, no wheezes, rhonchi, or rales Abdomen: +bs, soft, non tender, non distended, no masses, no hepatomegaly, no splenomegaly Musculoskeletal: nontender, no swelling, no obvious deformity Extremities: no edema, no cyanosis, no clubbing Pulses: 2+ symmetric, upper and lower extremities, normal cap refill Neurological: alert, oriented x 3, CN2-12 intact, strength normal upper extremities and lower extremities, sensation normal throughout, DTRs 2+ throughout, no cerebellar signs, gait normal Psychiatric: normal affect, behavior normal, pleasant  Breast: defer Gyn: defer Rectal: defer   Medicare Attestation I have personally reviewed: The patient's medical and social history Their use of alcohol, tobacco or illicit drugs Their current medications and supplements The patient's functional ability including ADLs,fall risks, home safety risks, cognitive, and hearing and visual impairment Diet and physical activities Evidence for depression or mood disorders  The patient's weight, height, BMI, and visual acuity have been recorded in the chart.  I have made referrals,  counseling, and provided education to the patient based on review of the above and I have provided the patient with a written personalized care plan for preventive services.     Vicie Mutters, PA-C   03/17/2015

## 2015-07-21 ENCOUNTER — Ambulatory Visit: Payer: Self-pay | Admitting: Internal Medicine

## 2015-07-28 ENCOUNTER — Ambulatory Visit (INDEPENDENT_AMBULATORY_CARE_PROVIDER_SITE_OTHER): Payer: Medicare Other | Admitting: Internal Medicine

## 2015-07-28 ENCOUNTER — Encounter: Payer: Self-pay | Admitting: Internal Medicine

## 2015-07-28 VITALS — BP 146/62 | HR 72 | Temp 97.8°F | Resp 16 | Ht 64.0 in | Wt 163.6 lb

## 2015-07-28 DIAGNOSIS — E782 Mixed hyperlipidemia: Secondary | ICD-10-CM

## 2015-07-28 DIAGNOSIS — Z79899 Other long term (current) drug therapy: Secondary | ICD-10-CM | POA: Diagnosis not present

## 2015-07-28 DIAGNOSIS — K2100 Gastro-esophageal reflux disease with esophagitis, without bleeding: Secondary | ICD-10-CM

## 2015-07-28 DIAGNOSIS — I10 Essential (primary) hypertension: Secondary | ICD-10-CM

## 2015-07-28 DIAGNOSIS — R7303 Prediabetes: Secondary | ICD-10-CM | POA: Diagnosis not present

## 2015-07-28 DIAGNOSIS — E559 Vitamin D deficiency, unspecified: Secondary | ICD-10-CM | POA: Diagnosis not present

## 2015-07-28 DIAGNOSIS — R0989 Other specified symptoms and signs involving the circulatory and respiratory systems: Secondary | ICD-10-CM

## 2015-07-28 LAB — CBC WITH DIFFERENTIAL/PLATELET
BASOS PCT: 0 %
Basophils Absolute: 0 cells/uL (ref 0–200)
EOS PCT: 2 %
Eosinophils Absolute: 220 cells/uL (ref 15–500)
HEMATOCRIT: 41.4 % (ref 35.0–45.0)
Hemoglobin: 14 g/dL (ref 11.7–15.5)
LYMPHS PCT: 26 %
Lymphs Abs: 2860 cells/uL (ref 850–3900)
MCH: 30.4 pg (ref 27.0–33.0)
MCHC: 33.8 g/dL (ref 32.0–36.0)
MCV: 89.8 fL (ref 80.0–100.0)
MONOS PCT: 9 %
MPV: 10.7 fL (ref 7.5–12.5)
Monocytes Absolute: 990 cells/uL — ABNORMAL HIGH (ref 200–950)
NEUTROS PCT: 63 %
Neutro Abs: 6930 cells/uL (ref 1500–7800)
PLATELETS: 260 10*3/uL (ref 140–400)
RBC: 4.61 MIL/uL (ref 3.80–5.10)
RDW: 13.5 % (ref 11.0–15.0)
WBC: 11 10*3/uL — AB (ref 3.8–10.8)

## 2015-07-28 LAB — LIPID PANEL
CHOLESTEROL: 217 mg/dL — AB (ref 125–200)
HDL: 31 mg/dL — AB (ref >=46)
LDL Cholesterol: 126 mg/dL (ref <130)
TRIGLYCERIDES: 298 mg/dL — AB (ref <150)
Total CHOL/HDL Ratio: 7 Ratio — ABNORMAL HIGH (ref <=5.0)
VLDL: 60 mg/dL — AB (ref <30)

## 2015-07-28 LAB — BASIC METABOLIC PANEL WITH GFR
BUN: 12 mg/dL (ref 7–25)
CALCIUM: 9.2 mg/dL (ref 8.6–10.4)
CO2: 25 mmol/L (ref 20–31)
Chloride: 102 mmol/L (ref 98–110)
Creat: 0.75 mg/dL (ref 0.50–0.99)
GFR, EST NON AFRICAN AMERICAN: 82 mL/min (ref >=60)
GLUCOSE: 78 mg/dL (ref 65–99)
POTASSIUM: 4.2 mmol/L (ref 3.5–5.3)
Sodium: 136 mmol/L (ref 135–146)

## 2015-07-28 LAB — HEPATIC FUNCTION PANEL
ALBUMIN: 4.5 g/dL (ref 3.6–5.1)
ALK PHOS: 71 U/L (ref 33–130)
ALT: 56 U/L — ABNORMAL HIGH (ref 6–29)
AST: 58 U/L — ABNORMAL HIGH (ref 10–35)
BILIRUBIN INDIRECT: 0.4 mg/dL (ref 0.2–1.2)
BILIRUBIN TOTAL: 0.5 mg/dL (ref 0.2–1.2)
Bilirubin, Direct: 0.1 mg/dL (ref <=0.2)
TOTAL PROTEIN: 7.4 g/dL (ref 6.1–8.1)

## 2015-07-28 LAB — HEMOGLOBIN A1C
Hgb A1c MFr Bld: 5.9 % — ABNORMAL HIGH (ref <5.7)
MEAN PLASMA GLUCOSE: 123 mg/dL

## 2015-07-28 LAB — TSH: TSH: 2.83 mIU/L

## 2015-07-28 LAB — MAGNESIUM: Magnesium: 2 mg/dL (ref 1.5–2.5)

## 2015-07-28 NOTE — Patient Instructions (Signed)

## 2015-07-29 LAB — VITAMIN D 25 HYDROXY (VIT D DEFICIENCY, FRACTURES): Vit D, 25-Hydroxy: 41 ng/mL (ref 30–100)

## 2015-07-29 LAB — INSULIN, RANDOM: Insulin: 21.6 u[IU]/mL — ABNORMAL HIGH (ref 2.0–19.6)

## 2015-07-30 ENCOUNTER — Other Ambulatory Visit: Payer: Self-pay | Admitting: Internal Medicine

## 2015-07-30 DIAGNOSIS — R945 Abnormal results of liver function studies: Secondary | ICD-10-CM

## 2015-07-30 DIAGNOSIS — R7989 Other specified abnormal findings of blood chemistry: Secondary | ICD-10-CM

## 2015-07-31 NOTE — Progress Notes (Signed)
Patient ID: Margaret Sullivan, female   DOB: 10-13-1945, 70 y.o.   MRN: [id number]  [facility]                       Unk Pinto, M.D.        Uvaldo Bristle. Silverio Lay, P.A.-C       Starlyn Skeans, P.A.-C   [facility]                [address] [place] [address] Telephone [phone] Telefax [phone] _________________________________________________________________________   This very nice 70 y.o. DWF presents for 3 month follow up with Hypertension, Hyperlipidemia, Pre-Diabetes and Vitamin D Deficiency.    Patient has many year hx/o labile HTN & has been followed expectantly.  Today's BP is sl elevated at 146/62. Patient has had no complaints of any cardiac type chest pain, palpitations, dyspnea/orthopnea/PND, dizziness, claudication, or dependent edema.   Hyperlipidemia is controlled with diet & meds. Patient denies myalgias or other med SE's. Current  Lipids are not at goal with  Cholesterol 217*; HDL 31*; LDL 126;  And elevated Triglycerides 298.    Also, the patient has history of PreDiabetes with A1c 6.1% in 2013 and has had no symptoms of reactive hypoglycemia, diabetic polys, paresthesias or visual blurring. Current  A1c is 5.9%.   Further, the patient also has history of Vitamin D Deficiency of "32" in 2014 and supplements vitamin D sporadically without any suspected side-effects. Current vitamin D is still low at 41.     Medication Sig  . TYLENOL EXTRA STRENGTH  Take by mouth. PRN  . VITAMIN D  Take 5,000 Units by mouth daily.   Marland Kitchen PROBIOTIC  Take by mouth daily.   Allergies  Allergen Reactions  . Ampicillin Hives and Swelling  . Aspirin Itching  . Doxycycline Itching and Swelling  . Macrodantin [Nitrofurantoin Macrocrystal] Hives, Itching and Swelling  . Percocet [Oxycodone-Acetaminophen]   . Propulsid [Cisapride] Itching and Swelling  . Voltaren [Diclofenac] Hives and Itching   . Codeine Palpitations  . Penicillins Swelling and Rash   PMHx:   Past Medical History  Diagnosis Date  . Essential tremor   . GERD (gastroesophageal reflux disease)   . Heart murmur   . High cholesterol   . Hypertension     labile  . Prediabetes   . Labile hypertension   . Hyperlipidemia    Immunization History  Administered Date(s) Administered  . Pneumococcal-Unspecified 03/05/1997  . Td 03/05/1998   Past Surgical History  Procedure Laterality Date  . Back surgery     FHx:    Reviewed / unchanged  SHx:    Reviewed / unchanged  Systems Review:  Constitutional: Denies fever, chills, wt changes, headaches, insomnia, fatigue, night sweats, change in appetite. Eyes: Denies redness, blurred vision, diplopia, discharge, itchy, watery eyes.  ENT: Denies discharge, congestion, post nasal drip, epistaxis, sore throat, earache, hearing loss, dental pain, tinnitus, vertigo, sinus pain, snoring.  CV: Denies chest pain, palpitations, irregular heartbeat, syncope, dyspnea, diaphoresis, orthopnea, PND, claudication or edema. Respiratory: denies cough, dyspnea, DOE, pleurisy, hoarseness, laryngitis, wheezing.  Gastrointestinal: Denies dysphagia, odynophagia, heartburn, reflux, water brash, abdominal pain or cramps, nausea, vomiting, bloating, diarrhea, constipation, hematemesis, melena, hematochezia  or hemorrhoids. Genitourinary: Denies dysuria, frequency, urgency, nocturia, hesitancy, discharge, hematuria or flank pain. Musculoskeletal: Denies arthralgias, myalgias, stiffness, jt. swelling, pain,  limping or strain/sprain.  Skin: Denies pruritus, rash, hives, warts, acne, eczema or change in skin lesion(s). Neuro: No weakness, tremor, incoordination, spasms, paresthesia or pain. Psychiatric: Denies confusion, memory loss or sensory loss. Endo: Denies change in weight, skin or hair change.  Heme/Lymph: No excessive bleeding, bruising or enlarged lymph nodes.  Physical Exam  BP  146/62   Pulse 72  Temp 97.8 F   Resp 16  Ht 5' 4"    Wt 163 lb 9.6 oz    BMI 28.07  Appears well nourished and in no distress.  Eyes: PERRLA, EOMs, conjunctiva no swelling or erythema. Sinuses: No frontal/maxillary tenderness ENT/Mouth: EAC's clear, TM's nl w/o erythema, bulging. Nares clear w/o erythema, swelling, exudates. Oropharynx clear without erythema or exudates. Oral hygiene is good. Tongue normal, non obstructing. Hearing intact.  Neck: Supple. Thyroid nl. Car 2+/2+ without bruits, nodes or JVD. Chest: Respirations nl with BS clear & equal w/o rales, rhonchi, wheezing or stridor.  Cor: Heart sounds normal w/ regular rate and rhythm without sig. murmurs, gallops, clicks, or rubs. Peripheral pulses normal and equal  without edema.  Abdomen: Soft & bowel sounds normal. Non-tender w/o guarding, rebound, hernias, masses, or organomegaly.  Lymphatics: Unremarkable.  Musculoskeletal: Full ROM all peripheral extremities, joint stability, 5/5 strength, and normal gait.  Skin: Warm, dry without exposed rashes, lesions or ecchymosis apparent.  Neuro: Cranial nerves intact, reflexes equal bilaterally. Sensory-motor testing grossly intact. Tendon reflexes grossly intact.  Pysch: Alert & oriented x 3.  Insight and judgement nl & appropriate. No ideations.  Assessment and Plan:  1. Hypertension  - TSH  2. Hyperlipidemia  - Lipid panel - TSH  3. Prediabetes  - Hemoglobin A1c - Insulin, random  4. Vitamin D deficiency  - VITAMIN D 25 Hydroxy   5. GERD  6. Medication management  - CBC with Differential/Platelet - BASIC METABOLIC PANEL WITH GFR - Hepatic function panel - Magnesium   Recommended regular exercise, BP monitoring, weight control, and discussed med and SE's. Recommended labs to assess and monitor clinical status. Further disposition pending results of labs. Over 30 minutes of exam, counseling, chart review was performed

## 2015-08-01 ENCOUNTER — Encounter: Payer: Self-pay | Admitting: Internal Medicine

## 2015-08-04 ENCOUNTER — Other Ambulatory Visit: Payer: Self-pay

## 2015-08-05 ENCOUNTER — Other Ambulatory Visit: Payer: Medicare Other

## 2015-08-05 DIAGNOSIS — R7989 Other specified abnormal findings of blood chemistry: Secondary | ICD-10-CM

## 2015-08-05 DIAGNOSIS — R945 Abnormal results of liver function studies: Secondary | ICD-10-CM

## 2015-08-06 LAB — HEPATITIS B CORE ANTIBODY, TOTAL: Hep B Core Total Ab: NONREACTIVE

## 2015-08-06 LAB — HEPATITIS B SURFACE ANTIBODY,QUALITATIVE: Hep B S Ab: NEGATIVE

## 2015-08-06 LAB — HEPATITIS A ANTIBODY, TOTAL: Hep A Total Ab: NONREACTIVE

## 2015-08-06 LAB — GAMMA GT: GGT: 70 U/L — AB (ref 7–51)

## 2015-08-06 LAB — HEPATITIS C ANTIBODY: HCV Ab: NEGATIVE

## 2015-08-08 ENCOUNTER — Other Ambulatory Visit: Payer: Medicare Other

## 2015-08-08 LAB — MITOCHONDRIAL ANTIBODIES

## 2015-08-08 LAB — ANTI-DNA ANTIBODY, DOUBLE-STRANDED: ds DNA Ab: <1 IU/mL

## 2015-08-08 LAB — CERULOPLASMIN: CERULOPLASMIN: 25 mg/dL (ref 18–53)

## 2015-08-08 LAB — ANTI-SMOOTH MUSCLE ANTIBODY, IGG: SMOOTH MUSCLE AB: 22 U — AB (ref <20)

## 2015-08-08 LAB — HEPATITIS B E ANTIBODY: Hepatitis Be Antibody: NONREACTIVE

## 2015-08-12 ENCOUNTER — Other Ambulatory Visit: Payer: Self-pay | Admitting: Internal Medicine

## 2015-08-12 DIAGNOSIS — R7989 Other specified abnormal findings of blood chemistry: Secondary | ICD-10-CM

## 2015-08-12 DIAGNOSIS — R945 Abnormal results of liver function studies: Secondary | ICD-10-CM

## 2015-08-17 ENCOUNTER — Other Ambulatory Visit: Payer: Medicare Other

## 2015-08-18 ENCOUNTER — Other Ambulatory Visit: Payer: Medicare Other

## 2015-08-18 ENCOUNTER — Other Ambulatory Visit: Payer: Self-pay | Admitting: Internal Medicine

## 2015-08-18 DIAGNOSIS — R945 Abnormal results of liver function studies: Secondary | ICD-10-CM

## 2015-08-18 DIAGNOSIS — R7989 Other specified abnormal findings of blood chemistry: Secondary | ICD-10-CM

## 2015-08-23 ENCOUNTER — Other Ambulatory Visit: Payer: Self-pay | Admitting: Internal Medicine

## 2015-08-23 ENCOUNTER — Ambulatory Visit
Admission: RE | Admit: 2015-08-23 | Discharge: 2015-08-23 | Disposition: A | Discharge status: Home / Self Care | Payer: Medicare Other | Source: Ambulatory Visit | Attending: Internal Medicine | Admitting: Internal Medicine

## 2015-08-23 DIAGNOSIS — R945 Abnormal results of liver function studies: Secondary | ICD-10-CM

## 2015-08-23 DIAGNOSIS — R7989 Other specified abnormal findings of blood chemistry: Secondary | ICD-10-CM

## 2015-08-26 LAB — COPPER, FREE: COPPER - FREE, SERUM/PLASMA: NOT DETECTED ug/L

## 2015-08-27 ENCOUNTER — Other Ambulatory Visit: Payer: Self-pay | Admitting: Internal Medicine

## 2015-08-27 DIAGNOSIS — R945 Abnormal results of liver function studies: Secondary | ICD-10-CM

## 2015-08-27 DIAGNOSIS — R7989 Other specified abnormal findings of blood chemistry: Secondary | ICD-10-CM

## 2015-08-29 ENCOUNTER — Other Ambulatory Visit: Payer: Self-pay | Admitting: Internal Medicine

## 2015-09-07 ENCOUNTER — Telehealth: Payer: Self-pay | Admitting: *Deleted

## 2015-09-07 NOTE — Telephone Encounter (Signed)
Patient called regarding the recommended appointment to the GI doctor regarding her low copper level.  The patient declined to schedule the GI appointment at this time.  She states she wants to eat a low fat diet to reduce the fat in her liver and have an appointment in 10/2015 to recheck her labs.  Dr Melford Aase is aware of the patient's request and states he wants the 10/2015 appointment to be with him so they can discuss the labs.

## 2015-09-09 ENCOUNTER — Emergency Department (HOSPITAL_COMMUNITY)
Admission: EM | Admit: 2015-09-09 | Discharge: 2015-09-09 | Disposition: A | Discharge status: Home / Self Care | Payer: Medicare Other | Attending: Emergency Medicine | Admitting: Emergency Medicine

## 2015-09-09 ENCOUNTER — Encounter (HOSPITAL_COMMUNITY): Payer: Self-pay | Admitting: Oncology

## 2015-09-09 DIAGNOSIS — Y939 Activity, unspecified: Secondary | ICD-10-CM | POA: Insufficient documentation

## 2015-09-09 DIAGNOSIS — Y929 Unspecified place or not applicable: Secondary | ICD-10-CM | POA: Insufficient documentation

## 2015-09-09 DIAGNOSIS — S0990XA Unspecified injury of head, initial encounter: Secondary | ICD-10-CM | POA: Insufficient documentation

## 2015-09-09 DIAGNOSIS — E785 Hyperlipidemia, unspecified: Secondary | ICD-10-CM | POA: Insufficient documentation

## 2015-09-09 DIAGNOSIS — I10 Essential (primary) hypertension: Secondary | ICD-10-CM | POA: Insufficient documentation

## 2015-09-09 DIAGNOSIS — Y999 Unspecified external cause status: Secondary | ICD-10-CM | POA: Diagnosis not present

## 2015-09-09 DIAGNOSIS — W06XXXA Fall from bed, initial encounter: Secondary | ICD-10-CM | POA: Diagnosis not present

## 2015-09-09 NOTE — Discharge Instructions (Signed)
Return to emergency room for severe headache, weakness or numbness and tingling on one side of the body, recurrent nausea and vomiting, seizure, passing out.  Head Injury, Adult You have a head injury. Headaches and throwing up (vomiting) are common after a head injury. It should be easy to wake up from sleeping. Sometimes you must stay in the hospital. Most problems happen within the first 24 hours. Side effects may occur up to 7-10 days after the injury.  WHAT ARE THE TYPES OF HEAD INJURIES? Head injuries can be as minor as a bump. Some head injuries can be more severe. More severe head injuries include:  A jarring injury to the brain (concussion).  A bruise of the brain (contusion). This mean there is bleeding in the brain that can cause swelling.  A cracked skull (skull fracture).  Bleeding in the brain that collects, clots, and forms a bump (hematoma). WHEN SHOULD I GET HELP RIGHT AWAY?   You are confused or sleepy.  You cannot be woken up.  You feel sick to your stomach (nauseous) or keep throwing up (vomiting).  Your dizziness or unsteadiness is getting worse.  You have very bad, lasting headaches that are not helped by medicine. Take medicines only as told by your doctor.  You cannot use your arms or legs like normal.  You cannot walk.  You notice changes in the black spots in the center of the colored part of your eye (pupil).  You have clear or bloody fluid coming from your nose or ears.  You have trouble seeing. During the next 24 hours after the injury, you must stay with someone who can watch you. This person should get help right away (call 911 in the [place] if you start to shake and are not able to control it (have seizures), you pass out, or you are unable to wake up. HOW CAN I PREVENT A HEAD INJURY IN THE FUTURE?  Wear seat belts.  Wear a helmet while bike riding and playing sports like football.  Stay away from dangerous activities around the house. WHEN  CAN I RETURN TO NORMAL ACTIVITIES AND ATHLETICS? See your doctor before doing these activities. You should not do normal activities or play contact sports until 1 week after the following symptoms have stopped:  Headache that does not go away.  Dizziness.  Poor attention.  Confusion.  Memory problems.  Sickness to your stomach or throwing up.  Tiredness.  Fussiness.  Bothered by bright lights or loud noises.  Anxiousness or depression.  Restless sleep. MAKE SURE YOU:   Understand these instructions.  Will watch your condition.  Will get help right away if you are not doing well or get worse.   This information is not intended to replace advice given to you by your health care provider. Make sure you discuss any questions you have with your health care provider.   Document Released: 02/02/2008 Document Revised: 03/12/2014 Document Reviewed: 10/27/2012 Elsevier Interactive Patient Education [employer].

## 2015-09-09 NOTE — ED Provider Notes (Signed)
CSN: [id number]     Arrival date & time 09/09/15  0507 History   First MD Initiated Contact with Patient 09/09/15 [phone]     Chief Complaint  Patient presents with  . Head Injury     (Consider location/radiation/quality/duration/timing/severity/associated sxs/prior Treatment) HPI Comments: Patient presents to the emergency department for evaluation of head injury. Patient reports that she fell out of bed and struck her head on a piece of furniture or the bed. She immediately awakened. Patient was alarmed about the large hematoma she had, but this is improving with ice. She does not have a headache. There is no neck pain. She denies injury to her extremities. She does not take a blood thinner.  Patient is a 70 y.o. female presenting with head injury.  Head Injury Associated symptoms: no headaches     Past Medical History  Diagnosis Date  . Essential tremor   . GERD (gastroesophageal reflux disease)   . Heart murmur   . High cholesterol   . Hypertension     labile  . Prediabetes   . Labile hypertension   . Hyperlipidemia    Past Surgical History  Procedure Laterality Date  . Back surgery     Family History  Problem Relation Age of Onset  . Lymphoma Mother   . Heart attack Father    Social History  Substance Use Topics  . Smoking status: Never Smoker   . Smokeless tobacco: Never Used  . Alcohol Use: No   OB History    No data available     Review of Systems  Skin:       contusion  Neurological: Negative for syncope and headaches.  All other systems reviewed and are negative.     Allergies  Ampicillin; Aspirin; Doxycycline; Macrodantin; Percocet; Propulsid; Voltaren; Codeine; and Penicillins  Home Medications   Prior to Admission medications   Medication Sig Start Date End Date Taking? Authorizing Provider  Acetaminophen (TYLENOL EXTRA STRENGTH PO) Take by mouth. PRN    Historical Provider, MD  Cholecalciferol (VITAMIN D PO) Take 5,000 Units by mouth daily.      Historical Provider, MD  Probiotic Product (PROBIOTIC PO) Take by mouth daily.    Historical Provider, MD   BP 153/86 mmHg  Pulse 67  Temp(Src) 98.1 F (36.7 C) (Oral)  Resp 18  Ht 5' 4"  (1.626 m)  Wt 158 lb (71.668 kg)  BMI 27.11 kg/m2  SpO2 98% Physical Exam  Constitutional: She is oriented to person, place, and time. She appears well-developed and well-nourished. No distress.  HENT:  Head: Normocephalic. Head is with contusion.    Right Ear: Hearing normal.  Left Ear: Hearing normal.  Nose: Nose normal.  Mouth/Throat: Oropharynx is clear and moist and mucous membranes are normal.  Eyes: Conjunctivae and EOM are normal. Pupils are equal, round, and reactive to light.  Neck: Normal range of motion. Neck supple. No spinous process tenderness and no muscular tenderness present.  Cardiovascular: Regular rhythm, S1 normal and S2 normal.  Exam reveals no gallop and no friction rub.   No murmur heard. Pulmonary/Chest: Effort normal and breath sounds normal. No respiratory distress. She exhibits no tenderness.  Abdominal: Soft. Normal appearance and bowel sounds are normal. There is no hepatosplenomegaly. There is no tenderness. There is no rebound, no guarding, no tenderness at McBurney's point and negative Murphy's sign. No hernia.  Musculoskeletal: Normal range of motion.  Neurological: She is alert and oriented to person, place, and time. She has normal strength.  No cranial nerve deficit or sensory deficit. Coordination normal. GCS eye subscore is 4. GCS verbal subscore is 5. GCS motor subscore is 6.  Extraocular muscle movement: normal No visual field cut Pupils: equal and reactive both direct and consensual response is normal No nystagmus present    Sensory function is intact to light touch, pinprick Proprioception intact  Grip strength 5/5 symmetric in upper extremities No pronator drift Normal finger to nose bilaterally  Lower extremity strength 5/5 against  gravity Normal heel to shin bilaterally  Gait: normal   Skin: Skin is warm, dry and intact. No rash noted. No cyanosis.  Psychiatric: She has a normal mood and affect. Her speech is normal and behavior is normal. Thought content normal.  Nursing note and vitals reviewed.   ED Course  Procedures (including critical care time) Labs Review Labs Reviewed - No data to display  Imaging Review No results found. I have personally reviewed and evaluated these images and lab results as part of my medical decision-making.   EKG Interpretation None      MDM   Final diagnoses:  Head injury, initial encounter    Patient presents to the emergency department for evaluation of minor head injury. Patient did strike her forehead on an object after a fall from bed. She is not expressing any headache. Neurologic examination is normal. I did discuss with her CT scan versus observation. She and her husband are comfortable with observation. She was given return precautions.    Orpah Greek, MD 09/09/15 [phone]

## 2015-09-09 NOTE — ED Notes (Signed)
Pt reports falling out of bed causing a hematoma to her forehead.  Is unsure what she hit her head on.  Denies neck/back pain or anticoagulant use.  Pt is A&O x 4.

## 2015-11-04 ENCOUNTER — Encounter: Payer: Self-pay | Admitting: Internal Medicine

## 2015-11-04 ENCOUNTER — Ambulatory Visit (INDEPENDENT_AMBULATORY_CARE_PROVIDER_SITE_OTHER): Payer: Medicare Other | Admitting: Internal Medicine

## 2015-11-04 VITALS — BP 126/84 | HR 64 | Temp 97.9°F | Resp 16 | Ht 64.0 in | Wt 151.4 lb

## 2015-11-04 DIAGNOSIS — Z79899 Other long term (current) drug therapy: Secondary | ICD-10-CM

## 2015-11-04 DIAGNOSIS — R7303 Prediabetes: Secondary | ICD-10-CM

## 2015-11-04 DIAGNOSIS — R790 Abnormal level of blood mineral: Secondary | ICD-10-CM

## 2015-11-04 DIAGNOSIS — E782 Mixed hyperlipidemia: Secondary | ICD-10-CM

## 2015-11-04 DIAGNOSIS — E559 Vitamin D deficiency, unspecified: Secondary | ICD-10-CM | POA: Diagnosis not present

## 2015-11-04 DIAGNOSIS — R945 Abnormal results of liver function studies: Secondary | ICD-10-CM | POA: Insufficient documentation

## 2015-11-04 DIAGNOSIS — R7989 Other specified abnormal findings of blood chemistry: Secondary | ICD-10-CM

## 2015-11-04 DIAGNOSIS — K2100 Gastro-esophageal reflux disease with esophagitis, without bleeding: Secondary | ICD-10-CM

## 2015-11-04 DIAGNOSIS — I10 Essential (primary) hypertension: Secondary | ICD-10-CM | POA: Diagnosis not present

## 2015-11-04 DIAGNOSIS — R0989 Other specified symptoms and signs involving the circulatory and respiratory systems: Secondary | ICD-10-CM

## 2015-11-04 LAB — CBC WITH DIFFERENTIAL/PLATELET
BASOS PCT: 1 %
Basophils Absolute: 98 cells/uL (ref 0–200)
EOS ABS: 196 {cells}/uL (ref 15–500)
Eosinophils Relative: 2 %
HCT: 39.8 % (ref 35.0–45.0)
Hemoglobin: 13.8 g/dL (ref 11.7–15.5)
LYMPHS PCT: 22 %
Lymphs Abs: 2156 cells/uL (ref 850–3900)
MCH: 31.2 pg (ref 27.0–33.0)
MCHC: 34.7 g/dL (ref 32.0–36.0)
MCV: 89.8 fL (ref 80.0–100.0)
MONOS PCT: 7 %
MPV: 10.2 fL (ref 7.5–12.5)
Monocytes Absolute: 686 cells/uL (ref 200–950)
Neutro Abs: 6664 cells/uL (ref 1500–7800)
Neutrophils Relative %: 68 %
PLATELETS: 253 10*3/uL (ref 140–400)
RBC: 4.43 MIL/uL (ref 3.80–5.10)
RDW: 12.9 % (ref 11.0–15.0)
WBC: 9.8 10*3/uL (ref 3.8–10.8)

## 2015-11-04 LAB — BASIC METABOLIC PANEL WITH GFR
BUN: 13 mg/dL (ref 7–25)
CALCIUM: 9.3 mg/dL (ref 8.6–10.4)
CO2: 26 mmol/L (ref 20–31)
CREATININE: 0.84 mg/dL (ref 0.50–0.99)
Chloride: 102 mmol/L (ref 98–110)
GFR, EST AFRICAN AMERICAN: 82 mL/min (ref >=60)
GFR, Est Non African American: 71 mL/min (ref >=60)
Glucose, Bld: 87 mg/dL (ref 65–99)
POTASSIUM: 4.6 mmol/L (ref 3.5–5.3)
Sodium: 138 mmol/L (ref 135–146)

## 2015-11-04 LAB — MAGNESIUM: MAGNESIUM: 2.2 mg/dL (ref 1.5–2.5)

## 2015-11-04 LAB — HEPATIC FUNCTION PANEL
ALT: 23 U/L (ref 6–29)
AST: 33 U/L (ref 10–35)
Albumin: 4.6 g/dL (ref 3.6–5.1)
Alkaline Phosphatase: 69 U/L (ref 33–130)
BILIRUBIN INDIRECT: 0.5 mg/dL (ref 0.2–1.2)
Bilirubin, Direct: 0.1 mg/dL (ref <=0.2)
TOTAL PROTEIN: 7.5 g/dL (ref 6.1–8.1)
Total Bilirubin: 0.6 mg/dL (ref 0.2–1.2)

## 2015-11-04 LAB — LIPID PANEL
Cholesterol: 172 mg/dL (ref 125–200)
HDL: 35 mg/dL — ABNORMAL LOW (ref >=46)
LDL Cholesterol: 106 mg/dL (ref <130)
Total CHOL/HDL Ratio: 4.9 Ratio (ref <=5.0)
Triglycerides: 157 mg/dL — ABNORMAL HIGH (ref <150)
VLDL: 31 mg/dL — ABNORMAL HIGH (ref <30)

## 2015-11-04 LAB — TSH: TSH: 1.91 m[IU]/L

## 2015-11-04 LAB — GAMMA GT: GGT: 44 U/L (ref 7–51)

## 2015-11-04 NOTE — Patient Instructions (Addendum)
Recommend Adult Low Dose Aspirin or  coated  Aspirin 81 mg daily  To reduce risk of Colon Cancer 20 %,  Skin Cancer 26 % ,  Melanoma 46%  and  Pancreatic cancer 60% ++++++++++++++++++++++++++++++++++++++++++++++++++++++ Vitamin D goal  is between 70-100.  Please make sure that you are taking your Vitamin D as directed.  It is very important as a natural anti-inflammatory  helping hair, skin, and nails, as well as reducing stroke and heart attack risk.  It helps your bones and helps with mood. It also decreases numerous cancer risks so please take it as directed.  Low Vit D is associated with a 200-300% higher risk for CANCER  and 200-300% higher risk for HEART   ATTACK  &  STROKE.   .....................................Marland Kitchen It is also associated with higher death rate at younger ages,  autoimmune diseases like Rheumatoid arthritis, Lupus, Multiple Sclerosis.    Also many other serious conditions, like depression, Alzheimer's Dementia, infertility, muscle aches, fatigue, fibromyalgia - just to name a few. ++++++++++++++++++++++++++++++++++++++++++++++++ Recommend the book "The END of DIETING" by Dr Excell Seltzer  & the book "The END of DIABETES " by Dr Excell Seltzer At [email or web address] - get book & Audio CD's    Being diabetic has a  300% increased risk for heart attack, stroke, cancer, and alzheimer- type vascular dementia. It is very important that you work harder with diet by avoiding all foods that are white. Avoid white rice (brown & wild rice is OK), white potatoes (sweetpotatoes in moderation is OK), White bread or wheat bread or anything made out of white flour like bagels, donuts, rolls, buns, biscuits, cakes, pastries, cookies, pizza crust, and pasta (made from white flour & egg whites) - vegetarian pasta or spinach or wheat pasta is OK. Multigrain breads like Arnold's or Pepperidge Farm, or multigrain sandwich thins or flatbreads.  Diet, exercise and weight loss can reverse and cure diabetes  in the early stages.  Diet, exercise and weight loss is very important in the control and prevention of complications of diabetes which affects every system in your body, ie. Brain - dementia/stroke, eyes - glaucoma/blindness, heart - heart attack/heart failure, kidneys - dialysis, stomach - gastric paralysis, intestines - malabsorption, nerves - severe painful neuritis, circulation - gangrene & loss of a leg(s), and finally cancer and Alzheimers.    I recommend avoid fried & greasy foods,  sweets/candy, white rice (brown or wild rice or Quinoa is OK), white potatoes (sweet potatoes are OK) - anything made from white flour - bagels, doughnuts, rolls, buns, biscuits,white and wheat breads, pizza crust and traditional pasta made of white flour & egg white(vegetarian pasta or spinach or wheat pasta is OK).  Multi-grain bread is OK - like multi-grain flat bread or sandwich thins. Avoid alcohol in excess. Exercise is also important.    Eat all the vegetables you want - avoid meat, especially red meat and dairy - especially cheese.  Cheese is the most concentrated form of trans-fats which is the worst thing to clog up our arteries. Veggie cheese is OK which can be found in the fresh produce section at [employer] or [employer] or [employer]  ++++++++++++++++++++++++++++++++++++++++++++++++++ DASH Eating Plan  DASH stands for "Dietary Approaches to Stop Hypertension."   The DASH eating plan is a healthy eating plan that has been shown to reduce high blood pressure (hypertension). Additional health benefits may include reducing the risk of type 2 diabetes mellitus, heart disease, and stroke. The DASH eating plan may also  help with weight loss. WHAT DO I NEED TO KNOW ABOUT THE DASH EATING PLAN? For the DASH eating plan, you will follow these general guidelines:  Choose foods with a percent daily value for sodium of less than 5% (as listed on the food label).  Use salt-free seasonings or herbs instead of  table salt or sea salt.  Check with your health care provider or pharmacist before using salt substitutes.  Eat lower-sodium products, often labeled as "lower sodium" or "no salt added."  Eat fresh foods.  Eat more vegetables, fruits, and low-fat dairy products.  Choose whole grains. Look for the word "whole" as the first word in the ingredient list.  Choose fish   Limit sweets, desserts, sugars, and sugary drinks.  Choose heart-healthy fats.  Eat veggie cheese   Eat more home-cooked food and less restaurant, buffet, and fast food.  Limit fried foods.  Cook foods using methods other than frying.  Limit canned vegetables. If you do use them, rinse them well to decrease the sodium.  When eating at a restaurant, ask that your food be prepared with less salt, or no salt if possible.                      WHAT FOODS CAN I EAT? Read Dr Fara Olden Fuhrman's books on The End of Dieting & The End of Diabetes  Grains Whole grain or whole wheat bread. Brown rice. Whole grain or whole wheat pasta. Quinoa, bulgur, and whole grain cereals. Low-sodium cereals. Corn or whole wheat flour tortillas. Whole grain cornbread. Whole grain crackers. Low-sodium crackers.  Vegetables Fresh or frozen vegetables (raw, steamed, roasted, or grilled). Low-sodium or reduced-sodium tomato and vegetable juices. Low-sodium or reduced-sodium tomato sauce and paste. Low-sodium or reduced-sodium canned vegetables.   Fruits All fresh, canned (in natural juice), or frozen fruits.  Protein Products  All fish and seafood.  Dried beans, peas, or lentils. Unsalted nuts and seeds. Unsalted canned beans.  Dairy Low-fat dairy products, such as skim or 1% milk, 2% or reduced-fat cheeses, low-fat ricotta or cottage cheese, or plain low-fat yogurt. Low-sodium or reduced-sodium cheeses.  Fats and Oils Tub margarines without trans fats. Light or reduced-fat mayonnaise and salad dressings (reduced sodium). Avocado. Safflower,  olive, or canola oils. Natural peanut or almond butter.  Other Unsalted popcorn and pretzels. The items listed above may not be a complete list of recommended foods or beverages. Contact your dietitian for more options.  +++++++++++++++++++++++++++++++++++++++++++  WHAT FOODS ARE NOT RECOMMENDED? Grains/ White flour or wheat flour White bread. White pasta. White rice. Refined cornbread. Bagels and croissants. Crackers that contain trans fat.  Vegetables  Creamed or fried vegetables. Vegetables in a . Regular canned vegetables. Regular canned tomato sauce and paste. Regular tomato and vegetable juices.  Fruits Dried fruits. Canned fruit in light or heavy syrup. Fruit juice.  Meat and Other Protein Products Meat in general - RED meat & White meat.  Fatty cuts of meat. Ribs, chicken wings, all processed meats as bacon, sausage, bologna, salami, fatback, hot dogs, bratwurst and packaged luncheon meats.  Dairy Whole or 2% milk, cream, half-and-half, and cream cheese. Whole-fat or sweetened yogurt. Full-fat cheeses or blue cheese. Non-dairy creamers and whipped toppings. Processed cheese, cheese spreads, or cheese curds.  Condiments Onion and garlic salt, seasoned salt, table salt, and sea salt. Canned and packaged gravies. Worcestershire sauce. Tartar sauce. Barbecue sauce. Teriyaki sauce. Soy sauce, including reduced sodium. Steak sauce. Fish sauce. Oyster sauce. Cocktail  sauce. Horseradish. Ketchup and mustard. Meat flavorings and tenderizers. Bouillon cubes. Hot sauce. Tabasco sauce. Marinades. Taco seasonings. Relishes.  Fats and Oils Butter, stick margarine, lard, shortening and bacon fat. Coconut, palm kernel, or palm oils. Regular salad dressings.  Pickles and olives. Salted popcorn and pretzels.  The items listed above may not be a complete list of foods and beverages to avoid.  Recommend Adult Low Dose Aspirin or  coated  Aspirin 81 mg daily  To reduce risk of Colon Cancer 20  %,  Skin Cancer 26 % ,  Melanoma 46%  and  Pancreatic cancer 60% ++++++++++++++++++++++++++++++++++++++++++++++++++++++ Vitamin D goal  is between 70-100.  Please make sure that you are taking your Vitamin D as directed.  It is very important as a natural anti-inflammatory  helping hair, skin, and nails, as well as reducing stroke and heart attack risk.  It helps your bones and helps with mood. It also decreases numerous cancer risks so please take it as directed.  Low Vit D is associated with a 200-300% higher risk for CANCER  and 200-300% higher risk for HEART   ATTACK  &  STROKE.   .....................................Marland Kitchen It is also associated with higher death rate at younger ages,  autoimmune diseases like Rheumatoid arthritis, Lupus, Multiple Sclerosis.    Also many other serious conditions, like depression, Alzheimer's Dementia, infertility, muscle aches, fatigue, fibromyalgia - just to name a few. ++++++++++++++++++++++++++++++++++++++++++++++++ Recommend the book "The END of DIETING" by Dr Excell Seltzer  & the book "The END of DIABETES " by Dr Excell Seltzer At [email or web address] - get book & Audio CD's    Being diabetic has a  300% increased risk for heart attack, stroke, cancer, and alzheimer- type vascular dementia. It is very important that you work harder with diet by avoiding all foods that are white. Avoid white rice (brown & wild rice is OK), white potatoes (sweetpotatoes in moderation is OK), White bread or wheat bread or anything made out of white flour like bagels, donuts, rolls, buns, biscuits, cakes, pastries, cookies, pizza crust, and pasta (made from white flour & egg whites) - vegetarian pasta or spinach or wheat pasta is OK. Multigrain breads like Arnold's or Pepperidge Farm, or multigrain sandwich thins or flatbreads.  Diet, exercise and weight loss can reverse and cure diabetes in the early stages.  Diet, exercise and weight loss is very important in the control and prevention  of complications of diabetes which affects every system in your body, ie. Brain - dementia/stroke, eyes - glaucoma/blindness, heart - heart attack/heart failure, kidneys - dialysis, stomach - gastric paralysis, intestines - malabsorption, nerves - severe painful neuritis, circulation - gangrene & loss of a leg(s), and finally cancer and Alzheimers.    I recommend avoid fried & greasy foods,  sweets/candy, white rice (brown or wild rice or Quinoa is OK), white potatoes (sweet potatoes are OK) - anything made from white flour - bagels, doughnuts, rolls, buns, biscuits,white and wheat breads, pizza crust and traditional pasta made of white flour & egg white(vegetarian pasta or spinach or wheat pasta is OK).  Multi-grain bread is OK - like multi-grain flat bread or sandwich thins. Avoid alcohol in excess. Exercise is also important.    Eat all the vegetables you want - avoid meat, especially red meat and dairy - especially cheese.  Cheese is the most concentrated form of trans-fats which is the worst thing to clog up our arteries. Veggie cheese is OK which can be found in the fresh  produce section at [facility] or [employer] or [employer]  ++++++++++++++++++++++++++++++++++++++++++++++++++ DASH Eating Plan  DASH stands for "Dietary Approaches to Stop Hypertension."   The DASH eating plan is a healthy eating plan that has been shown to reduce high blood pressure (hypertension). Additional health benefits may include reducing the risk of type 2 diabetes mellitus, heart disease, and stroke. The DASH eating plan may also help with weight loss. WHAT DO I NEED TO KNOW ABOUT THE DASH EATING PLAN? For the DASH eating plan, you will follow these general guidelines:  Choose foods with a percent daily value for sodium of less than 5% (as listed on the food label).  Use salt-free seasonings or herbs instead of table salt or sea salt.  Check with your health care provider or pharmacist before using salt  substitutes.  Eat lower-sodium products, often labeled as "lower sodium" or "no salt added."  Eat fresh foods.  Eat more vegetables, fruits, and low-fat dairy products.  Choose whole grains. Look for the word "whole" as the first word in the ingredient list.  Choose fish   Limit sweets, desserts, sugars, and sugary drinks.  Choose heart-healthy fats.  Eat veggie cheese   Eat more home-cooked food and less restaurant, buffet, and fast food.  Limit fried foods.  Cook foods using methods other than frying.  Limit canned vegetables. If you do use them, rinse them well to decrease the sodium.  When eating at a restaurant, ask that your food be prepared with less salt, or no salt if possible.                      WHAT FOODS CAN I EAT? Read Dr Fara Olden Fuhrman's books on The End of Dieting & The End of Diabetes  Grains Whole grain or whole wheat bread. Brown rice. Whole grain or whole wheat pasta. Quinoa, bulgur, and whole grain cereals. Low-sodium cereals. Corn or whole wheat flour tortillas. Whole grain cornbread. Whole grain crackers. Low-sodium crackers.  Vegetables Fresh or frozen vegetables (raw, steamed, roasted, or grilled). Low-sodium or reduced-sodium tomato and vegetable juices. Low-sodium or reduced-sodium tomato sauce and paste. Low-sodium or reduced-sodium canned vegetables.   Fruits All fresh, canned (in natural juice), or frozen fruits.  Protein Products  All fish and seafood.  Dried beans, peas, or lentils. Unsalted nuts and seeds. Unsalted canned beans.  Dairy Low-fat dairy products, such as skim or 1% milk, 2% or reduced-fat cheeses, low-fat ricotta or cottage cheese, or plain low-fat yogurt. Low-sodium or reduced-sodium cheeses.  Fats and Oils Tub margarines without trans fats. Light or reduced-fat mayonnaise and salad dressings (reduced sodium). Avocado. Safflower, olive, or canola oils. Natural peanut or almond butter.  Other Unsalted popcorn and  pretzels. The items listed above may not be a complete list of recommended foods or beverages. Contact your dietitian for more options.  +++++++++++++++++++++++++++++++++++++++++++  WHAT FOODS ARE NOT RECOMMENDED? Grains/ White flour or wheat flour White bread. White pasta. White rice. Refined cornbread. Bagels and croissants. Crackers that contain trans fat.  Vegetables  Creamed or fried vegetables. Vegetables in a . Regular canned vegetables. Regular canned tomato sauce and paste. Regular tomato and vegetable juices.  Fruits Dried fruits. Canned fruit in light or heavy syrup. Fruit juice.  Meat and Other Protein Products Meat in general - RED meat & White meat.  Fatty cuts of meat. Ribs, chicken wings, all processed meats as bacon, sausage, bologna, salami, fatback, hot dogs, bratwurst and packaged luncheon meats.  Dairy  Whole or 2% milk, cream, half-and-half, and cream cheese. Whole-fat or sweetened yogurt. Full-fat cheeses or blue cheese. Non-dairy creamers and whipped toppings. Processed cheese, cheese spreads, or cheese curds.  Condiments Onion and garlic salt, seasoned salt, table salt, and sea salt. Canned and packaged gravies. Worcestershire sauce. Tartar sauce. Barbecue sauce. Teriyaki sauce. Soy sauce, including reduced sodium. Steak sauce. Fish sauce. Oyster sauce. Cocktail sauce. Horseradish. Ketchup and mustard. Meat flavorings and tenderizers. Bouillon cubes. Hot sauce. Tabasco sauce. Marinades. Taco seasonings. Relishes.  Fats and Oils Butter, stick margarine, lard, shortening and bacon fat. Coconut, palm kernel, or palm oils. Regular salad dressings.  Pickles and olives. Salted popcorn and pretzels.  The items listed above may not be a complete list of foods and beverages to avoid.

## 2015-11-05 LAB — VITAMIN D 25 HYDROXY (VIT D DEFICIENCY, FRACTURES): Vit D, 25-Hydroxy: 60 ng/mL (ref 30–100)

## 2015-11-05 LAB — HEMOGLOBIN A1C
HEMOGLOBIN A1C: 5.3 % (ref <5.7)
MEAN PLASMA GLUCOSE: 105 mg/dL

## 2015-11-05 LAB — INSULIN, RANDOM: INSULIN: 8.5 u[IU]/mL (ref 2.0–19.6)

## 2015-11-07 ENCOUNTER — Encounter: Payer: Self-pay | Admitting: Internal Medicine

## 2015-11-07 NOTE — Progress Notes (Signed)
[facility]                       Unk Pinto, M.D.        Uvaldo Bristle. Silverio Lay, P.A.-C       Starlyn Skeans, P.A.-C  [facility]                [address] [place], [place] [address] Telephone [phone] Telefax [phone] ______________________________________________________________________     This very nice 70y.o.DWF presents for follow up with Hypertension, Hyperlipidemia, Pre-Diabetes and Vitamin D Deficiency. Patient has had recent Abnormal LFT's and w/u was negative except for very low and undetectable serum Copper levels suspect for Wilson's Disease. She does have hx/o Fatty liver by U/S and CT scan. She also saw her Opthalmologist and had a negative Eye exam. As she researched Wilson's Disease and discovered that it was due to excessive copper in the body and her blood level was low, her assessment was that she didn't need to see a Gastroenterologist as recommended.     Patient is followed expectantly for hx/o labile HTN & BP has been controlled at home. Today's BP is 126/84. Patient has had no complaints of any cardiac type chest pain, palpitations, dyspnea/orthopnea/PND, dizziness, claudication, or dependent edema.     Hyperlipidemia is not controlled with diet. Patient denies myalgias or other med SE's. Last Lipids were  Lab Results  Component Value Date   CHOL 172 11/04/2015   HDL 35 (L) 11/04/2015   LDLCALC 106 11/04/2015   TRIG 157 (H) 11/04/2015   CHOLHDL 4.9 11/04/2015      Also, the patient has history of PreDiabetes circa 2013 with A1c 6.1% and which she is trying to manage with diet and has had no symptoms of reactive hypoglycemia, diabetic polys, paresthesias or visual blurring.  Last A1c was at goal: Lab Results  Component Value Date   HGBA1C 5.3 11/04/2015      Further, the patient also has history of Vitamin D Deficiency with a level of Vit D 32 in 2014 and she  supplements vitamin D without any suspected side-effects. Last vitamin D was at goal: Lab Results  Component Value Date   VD25OH 60 11/04/2015   Current Outpatient Prescriptions on File Prior to Visit  Medication Sig  . TYLENOL EXTRA STRENGTH Take by mouth. PRN  . VITAMIN D 5,000 Units  Take daily.   . Probiotic PROBIOTIC Take by mouth daily.   Allergies  Allergen Reactions  . Ampicillin Hives and Swelling  . Aspirin Itching  . Doxycycline Itching and Swelling  . Macrodantin [Nitrofurantoin Macrocrystal] Hives, Itching and Swelling  . Percocet [Oxycodone-Acetaminophen]   . Propulsid [Cisapride] Itching and Swelling  . Voltaren [Diclofenac] Hives and Itching  . Codeine Palpitations  . Penicillins Swelling and Rash   PMHx:   Past Medical History:  Diagnosis Date  . Essential tremor   . GERD (gastroesophageal reflux disease)   . Heart murmur   . High cholesterol   . Hyperlipidemia   . Hypertension    labile  . Labile hypertension   . Prediabetes    Immunization History  Administered Date(s) Administered  . Pneumococcal-Unspecified 03/05/1997  . Td 03/05/1998   Past Surgical History:  Procedure Laterality Date  . BACK SURGERY     FHx:    Reviewed / unchanged  SHx:  Reviewed / unchanged  Systems Review:  Constitutional: Denies fever, chills, wt changes, headaches, insomnia, fatigue, night sweats, change in appetite. Eyes: Denies redness, blurred vision, diplopia, discharge, itchy, watery eyes.  ENT: Denies discharge, congestion, post nasal drip, epistaxis, sore throat, earache, hearing loss, dental pain, tinnitus, vertigo, sinus pain, snoring.  CV: Denies chest pain, palpitations, irregular heartbeat, syncope, dyspnea, diaphoresis, orthopnea, PND, claudication or edema. Respiratory: denies cough, dyspnea, DOE, pleurisy, hoarseness, laryngitis, wheezing.  Gastrointestinal: Denies dysphagia, odynophagia, heartburn, reflux, water brash, abdominal pain or cramps,  nausea, vomiting, bloating, diarrhea, constipation, hematemesis, melena, hematochezia  or hemorrhoids. Genitourinary: Denies dysuria, frequency, urgency, nocturia, hesitancy, discharge, hematuria or flank pain. Musculoskeletal: Denies arthralgias, myalgias, stiffness, jt. swelling, pain, limping or strain/sprain.  Skin: Denies pruritus, rash, hives, warts, acne, eczema or change in skin lesion(s). Neuro: No weakness, tremor, incoordination, spasms, paresthesia or pain. Psychiatric: Denies confusion, memory loss or sensory loss. Endo: Denies change in weight, skin or hair change.  Heme/Lymph: No excessive bleeding, bruising or enlarged lymph nodes.  Physical Exam BP 126/84   Pulse 64   Temp 97.9 F (36.6 C)   Resp 16   Ht 5' 4"  (1.626 m)   Wt 151 lb 6.4 oz (68.7 kg) Comment: 12 pound weight loss  BMI 25.99 kg/m   Appears well nourished and in no distress.  Eyes: PERRLA, EOMs, conjunctiva no swelling or erythema. Sinuses: No frontal/maxillary tenderness ENT/Mouth: EAC's clear, TM's nl w/o erythema, bulging. Nares clear w/o erythema, swelling, exudates. Oropharynx clear without erythema or exudates. Oral hygiene is good. Tongue normal, non obstructing. Hearing intact.  Neck: Supple. Thyroid nl. Car 2+/2+ without bruits, nodes or JVD. Chest: Respirations nl with BS clear & equal w/o rales, rhonchi, wheezing or stridor.  Cor: Heart sounds normal w/ regular rate and rhythm without sig. murmurs, gallops, clicks, or rubs. Peripheral pulses normal and equal  without edema.  Abdomen: Soft & bowel sounds normal. Non-tender w/o guarding, rebound, hernias, masses, or organomegaly.  Lymphatics: Unremarkable.  Musculoskeletal: Full ROM all peripheral extremities, joint stability, 5/5 strength, and normal gait.  Skin: Warm, dry without exposed rashes, lesions or ecchymosis apparent.  Neuro: Cranial nerves intact, reflexes equal bilaterally. Sensory-motor testing grossly intact. Tendon reflexes  grossly intact.  Pysch: Alert & oriented x 3.  Insight and judgement nl & appropriate. No ideations.  Assessment and Plan:  1. Hypertension  - Continue medication, monitor blood pressure at home. Continue DASH diet. Reminder to go to the ER if any CP, SOB, nausea, dizziness, severe HA, changes vision/speech, left arm numbness and tingling and jaw pain. - TSH  2. Hyperlipidemia  - Continue better diet, exercise & lifestyle modifications. Continue monitor periodic cholesterol functions  - Lipid panel - TSH  3. Prediabetes  - Continue diet, exercise, lifestyle modifications. Monitor appropriate labs. - Hemoglobin A1c - Insulin, random  4. Vitamin D deficiency  - Continue supplementation. - VITAMIN D 25 Hydroxy   5. Abnormal LFT's with hx/o very low serum copper level  - Gamma GT - Copper, Free - Hepatic function panel  6. GERD   7. Medication management  - Hepatic function panel - CBC with Differential/Platelet - BASIC METABOLIC PANEL WITH GFR - Magnesium     Patient did concede a reluctant willingness to see a GI doctor if repeat Copper levels are still low. Recommended regular exercise, BP monitoring, weight control, and discussed med and SE's. Recommended labs to assess and monitor clinical status. Further disposition pending results of labs. Over 30 minutes of  exam, counseling, chart review was performed

## 2015-11-18 LAB — COPPER, FREE: Copper - Free, Serum/Plasma: 720 mcg/L

## 2016-01-18 ENCOUNTER — Encounter: Payer: Self-pay | Admitting: Physician Assistant

## 2016-02-20 ENCOUNTER — Encounter: Payer: Self-pay | Admitting: Physician Assistant

## 2016-02-20 ENCOUNTER — Ambulatory Visit (INDEPENDENT_AMBULATORY_CARE_PROVIDER_SITE_OTHER): Payer: Medicare Other | Admitting: Physician Assistant

## 2016-02-20 VITALS — BP 138/76 | HR 75 | Temp 97.7°F | Resp 16 | Ht 64.0 in | Wt 156.0 lb

## 2016-02-20 DIAGNOSIS — Z0001 Encounter for general adult medical examination with abnormal findings: Secondary | ICD-10-CM

## 2016-02-20 DIAGNOSIS — K2100 Gastro-esophageal reflux disease with esophagitis, without bleeding: Secondary | ICD-10-CM

## 2016-02-20 DIAGNOSIS — Z136 Encounter for screening for cardiovascular disorders: Secondary | ICD-10-CM

## 2016-02-20 DIAGNOSIS — R945 Abnormal results of liver function studies: Secondary | ICD-10-CM

## 2016-02-20 DIAGNOSIS — R0989 Other specified symptoms and signs involving the circulatory and respiratory systems: Secondary | ICD-10-CM

## 2016-02-20 DIAGNOSIS — R7989 Other specified abnormal findings of blood chemistry: Secondary | ICD-10-CM

## 2016-02-20 DIAGNOSIS — I10 Essential (primary) hypertension: Secondary | ICD-10-CM | POA: Diagnosis not present

## 2016-02-20 DIAGNOSIS — E782 Mixed hyperlipidemia: Secondary | ICD-10-CM

## 2016-02-20 DIAGNOSIS — E559 Vitamin D deficiency, unspecified: Secondary | ICD-10-CM

## 2016-02-20 DIAGNOSIS — R7303 Prediabetes: Secondary | ICD-10-CM

## 2016-02-20 DIAGNOSIS — Z0000 Encounter for general adult medical examination without abnormal findings: Secondary | ICD-10-CM

## 2016-02-20 DIAGNOSIS — Z79899 Other long term (current) drug therapy: Secondary | ICD-10-CM

## 2016-02-20 LAB — HEPATIC FUNCTION PANEL
ALT: 26 U/L (ref 6–29)
AST: 35 U/L (ref 10–35)
Albumin: 4.7 g/dL (ref 3.6–5.1)
Alkaline Phosphatase: 72 U/L (ref 33–130)
Bilirubin, Direct: 0.1 mg/dL (ref <=0.2)
Indirect Bilirubin: 0.6 mg/dL (ref 0.2–1.2)
Total Bilirubin: 0.7 mg/dL (ref 0.2–1.2)
Total Protein: 7.6 g/dL (ref 6.1–8.1)

## 2016-02-20 LAB — LIPID PANEL
Cholesterol: 192 mg/dL (ref <200)
HDL: 32 mg/dL — ABNORMAL LOW (ref >50)
LDL Cholesterol: 122 mg/dL — ABNORMAL HIGH (ref <100)
Total CHOL/HDL Ratio: 6 ratio — ABNORMAL HIGH (ref <5.0)
Triglycerides: 192 mg/dL — ABNORMAL HIGH (ref <150)
VLDL: 38 mg/dL — ABNORMAL HIGH (ref <30)

## 2016-02-20 LAB — CBC WITH DIFFERENTIAL/PLATELET
BASOS ABS: 0 {cells}/uL (ref 0–200)
Basophils Relative: 0 %
EOS ABS: 188 {cells}/uL (ref 15–500)
EOS PCT: 2 %
HCT: 40.9 % (ref 35.0–45.0)
Hemoglobin: 13.9 g/dL (ref 11.7–15.5)
LYMPHS PCT: 27 %
Lymphs Abs: 2538 cells/uL (ref 850–3900)
MCH: 31 pg (ref 27.0–33.0)
MCHC: 34 g/dL (ref 32.0–36.0)
MCV: 91.1 fL (ref 80.0–100.0)
MONOS PCT: 9 %
MPV: 9.8 fL (ref 7.5–12.5)
Monocytes Absolute: 846 cells/uL (ref 200–950)
NEUTROS ABS: 5828 {cells}/uL (ref 1500–7800)
NEUTROS PCT: 62 %
PLATELETS: 251 10*3/uL (ref 140–400)
RBC: 4.49 MIL/uL (ref 3.80–5.10)
RDW: 13.1 % (ref 11.0–15.0)
WBC: 9.4 10*3/uL (ref 3.8–10.8)

## 2016-02-20 LAB — BASIC METABOLIC PANEL WITHOUT GFR
BUN: 11 mg/dL (ref 7–25)
CO2: 29 mmol/L (ref 20–31)
Calcium: 9.7 mg/dL (ref 8.6–10.4)
Chloride: 103 mmol/L (ref 98–110)
Creat: 0.83 mg/dL (ref 0.60–0.93)
GFR, Est African American: 83 mL/min (ref >=60)
GFR, Est Non African American: 72 mL/min (ref >=60)
Glucose, Bld: 93 mg/dL (ref 65–99)
Potassium: 5.2 mmol/L (ref 3.5–5.3)
Sodium: 140 mmol/L (ref 135–146)

## 2016-02-20 NOTE — Progress Notes (Signed)
Complete Physical  Assessment and Plan: Hypertension - continue medications, DASH diet, exercise and monitor at home. Call if greater than 130/80.  - CBC with Differential/Platelet - BASIC METABOLIC PANEL WITH GFR - Hepatic function panel - TSH - Urinalysis, Routine w reflex microscopic (not at [facility]) - Microalbumin / creatinine urine ratio - EKG 12-Lead   Hyperlipidemia -continue medications, check lipids, decrease fatty foods, increase activity.  - Lipid panel  Prediabetes Discussed general issues about diabetes pathophysiology and management., Educational material distributed., Suggested low cholesterol diet., Encouraged aerobic exercise., Discussed foot care., Reminded to get yearly retinal exam.   Vitamin D deficiency   GERD Continue PPI/H2 blocker, diet discussed  Medication management  Poor compliance with medical recommendations Declines vaccinations  Encounter for general adult medical examination with abnormal findings Get MGM, not willing to do colonoscopy, will do cologuard.   Discussed med's effects and SE's. Screening labs and tests as requested with regular follow-up as recommended. Over 40 minutes of exam, counseling, chart review, and complex, high level critical decision making was performed this visit.  Future Appointments Date Time Provider [place]  02/19/2017 10:00 AM Vicie Mutters, PA-C [facility] None    HPI  70 y.o. female  presents for a complete physical.  Her blood pressure has been controlled at home, she is controlling diet/exercise, today their BP is BP: 138/76  She does workout. She denies chest pain, shortness of breath, dizziness.  She is not on cholesterol medication and denies myalgias. Her cholesterol is not at goal. The cholesterol last visit was:   Lab Results  Component Value Date   CHOL 172 11/04/2015   HDL 35 (L) 11/04/2015   LDLCALC 106 11/04/2015   TRIG 157 (H) 11/04/2015   CHOLHDL 4.9 11/04/2015   She has been  working on diet and exercise for prediabetes, and denies paresthesia of the feet, polydipsia, polyuria and visual disturbances. Last A1C in the office was:  Lab Results  Component Value Date   HGBA1C 5.3 11/04/2015  Patient is on Vitamin D supplement.   Lab Results  Component Value Date   VD25OH 60 11/04/2015  Retired Pharmacist, hospital.  She has had elevated LFTs, negative copper Lab Results  Component Value Date   ALT 23 11/04/2015   AST 33 11/04/2015   ALKPHOS 69 11/04/2015   BILITOT 0.6 11/04/2015   Has had pneumonia 3 times after procedures and would prefer to avoid colonoscopy.  BMI is Body mass index is 26.78 kg/m., she is working on diet and exercise. Wt Readings from Last 3 Encounters:  02/20/16 156 lb (70.8 kg)  11/04/15 151 lb 6.4 oz (68.7 kg)  09/09/15 158 lb (71.7 kg)    Current Medications:  Current Outpatient Prescriptions on File Prior to Visit  Medication Sig Dispense Refill  . Acetaminophen (TYLENOL EXTRA STRENGTH PO) Take by mouth. PRN    . Cholecalciferol (VITAMIN D PO) Take 5,000 Units by mouth daily.     Marland Kitchen OVER THE COUNTER MEDICATION 1 tablet daily.    . Probiotic Product (PROBIOTIC PO) Take by mouth daily.     No current facility-administered medications on file prior to visit.    Health Maintenance:   Immunization History  Administered Date(s) Administered  . Pneumococcal-Unspecified 03/05/1997  . Td 03/05/1998   Last colonoscopy: 2008 DUE for this and would like to do cologuard instead but declines until next year Last mammogram: 07/2013 normal, DUE Last pap smear/pelvic exam: 2010, declines another DEXA: 6-7 years, normal, declines another Hep panel neg  2013 [place] AB 2013, + gallstone US neck 2013  Prior vaccinations: TD or Tdap: 2000, will check Influenza: declines Pneumococcal: 1999 Prevnar 13: declines Shingles/Zostavax: declines  Last Dental Exam: Dr. Gloriann Loan, June 2017 Last Eye Exam: Dr. Delman Cheadle, wears glasses, 01/2016 Patient Care  Team: Unk Pinto, MD as PCP - General (Internal Medicine) Sharyne Peach, MD as Consulting Physician (Ophthalmology) Earley Favor (Dentistry) Lafayette Dragon, MD (Inactive) as Consulting Physician (Gastroenterology)  Medical History:  Past Medical History:  Diagnosis Date  . Essential tremor   . GERD (gastroesophageal reflux disease)   . Heart murmur   . High cholesterol   . Hyperlipidemia   . Hypertension    labile  . Labile hypertension   . Prediabetes    Allergies Allergies  Allergen Reactions  . Ampicillin Hives and Swelling  . Aspirin Itching  . Doxycycline Itching and Swelling  . Macrodantin [Nitrofurantoin Macrocrystal] Hives, Itching and Swelling  . Percocet [Oxycodone-Acetaminophen]   . Propulsid [Cisapride] Itching and Swelling  . Voltaren [Diclofenac] Hives and Itching  . Codeine Palpitations  . Penicillins Swelling and Rash    SURGICAL HISTORY She  has a past surgical history that includes Back surgery. FAMILY HISTORY Her family history includes Heart attack in her father; Lymphoma in her mother. SOCIAL HISTORY She  reports that she has never smoked. She has never used smokeless tobacco. She reports that she does not drink alcohol or use drugs.  Review of Systems: Review of Systems  Constitutional: Negative.   HENT: Negative.   Eyes: Negative.   Respiratory: Negative.  Negative for shortness of breath.   Cardiovascular: Negative.  Negative for chest pain.  Gastrointestinal: Negative.        + hemorrhoid  Genitourinary: Negative.   Musculoskeletal: Negative.  Negative for falls.  Skin: Negative.   Neurological: Negative.   Psychiatric/Behavioral: Negative.  Negative for depression.    Physical Exam: Estimated body mass index is 26.78 kg/m as calculated from the following:   Height as of this encounter: 5' 4"  (1.626 m).   Weight as of this encounter: 156 lb (70.8 kg). BP 138/76   Pulse 75   Temp 97.7 F (36.5 C)   Resp 16   Ht 5' 4"  (1.626  m)   Wt 156 lb (70.8 kg)   SpO2 98%   BMI 26.78 kg/m  General Appearance: Well nourished, in no apparent distress.  Eyes: PERRLA, EOMs, conjunctiva no swelling or erythema, normal fundi and vessels.  Sinuses: No Frontal/maxillary tenderness  ENT/Mouth: Ext aud canals clear, normal light reflex with TMs without erythema, bulging. Good dentition. No erythema, swelling, or exudate on post pharynx. Tonsils not swollen or erythematous. Hearing normal.  Neck: Supple, thyroid normal. No bruits  Respiratory: Respiratory effort normal, BS equal bilaterally without rales, rhonchi, wheezing or stridor.  Cardio: RRR without murmurs, rubs or gallops. Brisk peripheral pulses without edema.  Chest: symmetric, with normal excursions and percussion.  Breasts: Symmetric, without lumps, nipple discharge, retractions.  Abdomen: Soft, nontender, no guarding, rebound, hernias, masses, or organomegaly.  Lymphatics: Non tender without lymphadenopathy.  Genitourinary: defer Musculoskeletal: Full ROM all peripheral extremities,5/5 strength, and normal gait.  Skin: Warm, dry without rashes, lesions, ecchymosis. Neuro: Cranial nerves intact, reflexes equal bilaterally. Normal muscle tone, no cerebellar symptoms. Sensation intact.  Psych: Awake and oriented X 3, normal affect, Insight and Judgment appropriate.   EKG: WNL no ST changes. AORTA SCAN: defer  Vicie Mutters 10:31 AM [facility]

## 2016-02-20 NOTE — Patient Instructions (Addendum)
Please schedule mammogram Cologuard is an easy to use noninvasive colon cancer screening test based on the latest advances in stool DNA science.   Colon cancer is 3rd most diagnosed cancer and 2nd leading cause of death in both men and women 70 years of age and older despite being one of the most preventable and treatable cancers if found early.  4 of out 5 people diagnosed with colon cancer have NO prior family history.  When caught EARLY 90% of colon cancer is curable.   You have agreed to do a Cologuard screening and have declined a colonoscopy in spite of being explained the risks and benefits of the colonoscopy in detail, including cancer and death. Please understand that this is test not as sensitive or specific as a colonoscopy and you are still recommended to get a colonoscopy.   If you are NOT medicare please call your insurance company and give them these items to see if they will cover it: 1) CPT code, [id number] 2) Provider is Probation officer 3) [employer] NPI [phone] 4) [place] Tax ID [phone]  Out-of-pocket cost for Cologuard can range from $0 - $649 so please call  You will receive a short call from [place] support center at [employer], when you receive a call they will say they are from [place],  to confirm your mailing address and give you more information.  When they calll you, it will appear on the caller ID as "[employer]" or in some cases only this number will appear, [phone].   [employer] will ship your collection kit directly to you. You will collect a single stool sample in the privacy of your own home, no special preparation required. You will return the kit via [employer] pre-paid shipping or pick-up, in the same box it arrived in. Then I will contact you to discuss your results after I receive them from the laboratory.   If you have any questions or concerns, Colog[employer]ard Customer Support  Specialist are available 24 hours a day, 7 days a week at [phone] or go to [email or web address].

## 2016-02-21 LAB — URINALYSIS, ROUTINE W REFLEX MICROSCOPIC
Bilirubin Urine: NEGATIVE
Glucose, UA: NEGATIVE
Hgb urine dipstick: NEGATIVE
Ketones, ur: NEGATIVE
Leukocytes, UA: NEGATIVE
NITRITE: NEGATIVE
PH: 6.5 (ref 5.0–8.0)
Protein, ur: NEGATIVE
SPECIFIC GRAVITY, URINE: 1.005 (ref 1.001–1.035)

## 2016-02-21 LAB — MICROALBUMIN / CREATININE URINE RATIO
CREATININE, URINE: 17 mg/dL — AB (ref 20–320)
MICROALB UR: 0.2 mg/dL
Microalb Creat Ratio: 12 mcg/mg creat (ref <30)

## 2016-02-21 LAB — TSH: TSH: 2.11 m[IU]/L

## 2016-08-28 ENCOUNTER — Ambulatory Visit: Payer: Self-pay | Admitting: Internal Medicine

## 2016-09-04 ENCOUNTER — Ambulatory Visit (INDEPENDENT_AMBULATORY_CARE_PROVIDER_SITE_OTHER): Payer: Medicare Other | Admitting: Internal Medicine

## 2016-09-04 VITALS — BP 144/68 | HR 84 | Temp 97.9°F | Resp 16 | Ht 64.0 in | Wt 156.6 lb

## 2016-09-04 DIAGNOSIS — R790 Abnormal level of blood mineral: Secondary | ICD-10-CM | POA: Diagnosis not present

## 2016-09-04 DIAGNOSIS — R7303 Prediabetes: Secondary | ICD-10-CM | POA: Diagnosis not present

## 2016-09-04 DIAGNOSIS — K760 Fatty (change of) liver, not elsewhere classified: Secondary | ICD-10-CM

## 2016-09-04 DIAGNOSIS — Z79899 Other long term (current) drug therapy: Secondary | ICD-10-CM | POA: Diagnosis not present

## 2016-09-04 DIAGNOSIS — E559 Vitamin D deficiency, unspecified: Secondary | ICD-10-CM | POA: Diagnosis not present

## 2016-09-04 DIAGNOSIS — R945 Abnormal results of liver function studies: Secondary | ICD-10-CM

## 2016-09-04 DIAGNOSIS — R7989 Other specified abnormal findings of blood chemistry: Secondary | ICD-10-CM

## 2016-09-04 DIAGNOSIS — R0989 Other specified symptoms and signs involving the circulatory and respiratory systems: Secondary | ICD-10-CM | POA: Diagnosis not present

## 2016-09-04 DIAGNOSIS — E782 Mixed hyperlipidemia: Secondary | ICD-10-CM

## 2016-09-04 LAB — BASIC METABOLIC PANEL WITH GFR
BUN: 11 mg/dL (ref 7–25)
CO2: 27 mmol/L (ref 20–31)
Calcium: 9.7 mg/dL (ref 8.6–10.4)
Chloride: 102 mmol/L (ref 98–110)
Creat: 0.79 mg/dL (ref 0.60–0.93)
GFR, EST AFRICAN AMERICAN: 88 mL/min (ref >=60)
GFR, EST NON AFRICAN AMERICAN: 76 mL/min (ref >=60)
Glucose, Bld: 102 mg/dL — ABNORMAL HIGH (ref 65–99)
POTASSIUM: 4.5 mmol/L (ref 3.5–5.3)
Sodium: 140 mmol/L (ref 135–146)

## 2016-09-04 LAB — LIPID PANEL
Cholesterol: 228 mg/dL — ABNORMAL HIGH (ref <200)
HDL: 37 mg/dL — AB (ref >50)
LDL CALC: 160 mg/dL — AB (ref <100)
TRIGLYCERIDES: 153 mg/dL — AB (ref <150)
Total CHOL/HDL Ratio: 6.2 Ratio — ABNORMAL HIGH (ref <5.0)
VLDL: 31 mg/dL — AB (ref <30)

## 2016-09-04 LAB — CBC WITH DIFFERENTIAL/PLATELET
BASOS PCT: 0 %
Basophils Absolute: 0 cells/uL (ref 0–200)
EOS ABS: 198 {cells}/uL (ref 15–500)
Eosinophils Relative: 2 %
HCT: 41.2 % (ref 35.0–45.0)
HEMOGLOBIN: 13.7 g/dL (ref 11.7–15.5)
Lymphocytes Relative: 21 %
Lymphs Abs: 2079 cells/uL (ref 850–3900)
MCH: 30.1 pg (ref 27.0–33.0)
MCHC: 33.3 g/dL (ref 32.0–36.0)
MCV: 90.5 fL (ref 80.0–100.0)
MPV: 9.8 fL (ref 7.5–12.5)
Monocytes Absolute: 891 cells/uL (ref 200–950)
Monocytes Relative: 9 %
NEUTROS ABS: 6732 {cells}/uL (ref 1500–7800)
Neutrophils Relative %: 68 %
Platelets: 273 10*3/uL (ref 140–400)
RBC: 4.55 MIL/uL (ref 3.80–5.10)
RDW: 13.1 % (ref 11.0–15.0)
WBC: 9.9 10*3/uL (ref 3.8–10.8)

## 2016-09-04 LAB — HEPATIC FUNCTION PANEL
ALK PHOS: 71 U/L (ref 33–130)
ALT: 24 U/L (ref 6–29)
AST: 27 U/L (ref 10–35)
Albumin: 4.7 g/dL (ref 3.6–5.1)
BILIRUBIN INDIRECT: 0.4 mg/dL (ref 0.2–1.2)
BILIRUBIN TOTAL: 0.5 mg/dL (ref 0.2–1.2)
Bilirubin, Direct: 0.1 mg/dL (ref <=0.2)
Total Protein: 7.8 g/dL (ref 6.1–8.1)

## 2016-09-04 LAB — TSH: TSH: 2.68 mIU/L

## 2016-09-04 NOTE — Progress Notes (Signed)
This very nice 71 y.o. DWF presents for 3 month follow up with Hypertension, Hyperlipidemia, Pre-Diabetes and Vitamin D Deficiency. Last week patient lost footing, slipped falling backwards and head her head w/o LOC. She recovered quickly and had no neuro signs or sx's and deferred ER eval.       Patient is followed expectantly for labile HTN & BP has been controlled at home. Today's BP is slightly  elevated at 144/68.  Patient has had no complaints of any cardiac type chest pain, palpitations, dyspnea/orthopnea/PND, dizziness, claudication, or dependent edema. Patient also has hx/o fatty liver by U/S and Abd CT scan and recently had elevated LFT's with negative serologic w/u and subsequent LFT's returned to normal.      Hyperlipidemia is not controlled with diet & she has been reticent to take meds for Chol. Last Lipids were not at goal: Lab Results  Component Value Date   CHOL 228 (H) 09/04/2016   HDL 37 (L) 09/04/2016   LDLCALC 160 (H) 09/04/2016   TRIG 153 (H) 09/04/2016   CHOLHDL 6.2 (H) 09/04/2016      Also, the patient has history of PreDiabetes (A1c 6.1% in 2013) and has had no symptoms of reactive hypoglycemia, diabetic polys, paresthesias or visual blurring.  Last A1c was at goal: Lab Results  Component Value Date   HGBA1C 5.6 09/04/2016      Further, the patient also has history of Vitamin D Deficiency ("24" in 2014)  and supplements vitamin D without any suspected side-effects. Last vitamin D was at goal:  Lab Results  Component Value Date   VD25OH 40 09/04/2016   Current Outpatient Prescriptions on File Prior to Visit  Medication Sig  . Acetaminophen (TYLENOL EXTRA STRENGTH PO) Take by mouth. PRN  . Cholecalciferol (VITAMIN D PO) Take 5,000 Units by mouth daily.   . Probiotic Product (PROBIOTIC PO) Take by mouth daily.   No current facility-administered medications on file prior to visit.    Allergies  Allergen Reactions  . Ampicillin Hives and Swelling  .  Aspirin Itching  . Doxycycline Itching and Swelling  . Macrodantin [Nitrofurantoin Macrocrystal] Hives, Itching and Swelling  . Percocet [Oxycodone-Acetaminophen]   . Propulsid [Cisapride] Itching and Swelling  . Voltaren [Diclofenac] Hives and Itching  . Codeine Palpitations  . Penicillins Swelling and Rash   PMHx:   Past Medical History:  Diagnosis Date  . Essential tremor   . GERD (gastroesophageal reflux disease)   . Heart murmur   . High cholesterol   . Hyperlipidemia   . Hypertension    labile  . Labile hypertension   . Prediabetes    Immunization History  Administered Date(s) Administered  . Pneumococcal-Unspecified 03/05/1997  . Td 03/05/1998   Past Surgical History:  Procedure Laterality Date  . BACK SURGERY     FHx:    Reviewed / unchanged  SHx:    Reviewed / unchanged  Systems Review:  Constitutional: Denies fever, chills, wt changes, headaches, insomnia, fatigue, night sweats, change in appetite. Eyes: Denies redness, blurred vision, diplopia, discharge, itchy, watery eyes.  ENT: Denies discharge, congestion, post nasal drip, epistaxis, sore throat, earache, hearing loss, dental pain, tinnitus, vertigo, sinus pain, snoring.  CV: Denies chest pain, palpitations, irregular heartbeat, syncope, dyspnea, diaphoresis, orthopnea, PND, claudication or edema. Respiratory: denies cough, dyspnea, DOE, pleurisy, hoarseness, laryngitis, wheezing.  Gastrointestinal: Denies dysphagia, odynophagia, heartburn, reflux, water brash, abdominal pain or cramps, nausea, vomiting, bloating, diarrhea, constipation, hematemesis, melena, hematochezia  or hemorrhoids.  Genitourinary: Denies dysuria, frequency, urgency, nocturia, hesitancy, discharge, hematuria or flank pain. Musculoskeletal: Denies arthralgias, myalgias, stiffness, jt. swelling, pain, limping or strain/sprain.  Skin: Denies pruritus, rash, hives, warts, acne, eczema or change in skin lesion(s). Neuro: No weakness, tremor,  incoordination, spasms, paresthesia or pain. Psychiatric: Denies confusion, memory loss or sensory loss. Endo: Denies change in weight, skin or hair change.  Heme/Lymph: No excessive bleeding, bruising or enlarged lymph nodes.  Physical Exam  BP (!) 144/68   Pulse 84   Temp 97.9 F (36.6 C)   Resp 16   Ht 5' 4"  (1.626 m)   Wt 156 lb 9.6 oz (71 kg)   BMI 26.88 kg/m   Appears well nourished, well groomed  and in no distress.  Eyes: PERRLA, EOMs, conjunctiva no swelling or erythema. Sinuses: No frontal/maxillary tenderness ENT/Mouth: EAC's clear, TM's nl w/o erythema, bulging. Nares clear w/o erythema, swelling, exudates. Oropharynx clear without erythema or exudates. Oral hygiene is good. Tongue normal, non obstructing. Hearing intact.  Neck: Supple. Thyroid nl. Car 2+/2+ without bruits, nodes or JVD. Chest: Respirations nl with BS clear & equal w/o rales, rhonchi, wheezing or stridor.  Cor: Heart sounds normal w/ regular rate and rhythm without sig. murmurs, gallops, clicks or rubs. Peripheral pulses normal and equal  without edema.  Abdomen: Soft & bowel sounds normal. Non-tender w/o guarding, rebound, hernias, masses or organomegaly.  Lymphatics: Unremarkable.  Musculoskeletal: Full ROM all peripheral extremities, joint stability, 5/5 strength and normal gait.  Skin: Warm, dry without exposed rashes, lesions or ecchymosis apparent.  Neuro: Cranial nerves intact, reflexes equal bilaterally. Sensory-motor testing grossly intact. Tendon reflexes grossly intact.  Pysch: Alert & oriented x 3.  Insight and judgement nl & appropriate. No ideations.  Assessment and Plan:  1. Hypertension  - Continue medication, monitor blood pressure at home.  - Continue DASH diet. Reminder to go to the ER if any CP,  SOB, nausea, dizziness, severe HA, changes vision/speech.  - CBC with Differential/Platelet - BASIC METABOLIC PANEL WITH GFR - Magnesium - TSH  2. Hyperlipidemia, mixed  -  Continue diet/meds, exercise,& lifestyle modifications.  - Continue monitor periodic cholesterol/liver & renal functions   - Hepatic function panel - Lipid panel - TSH  3. Prediabetes  - Continue diet, exercise, lifestyle modifications.  - Monitor appropriate labs.  - Hemoglobin A1c - Insulin, random  4. Vitamin D deficiency  - Continue supplementation.  - VITAMIN D 25 Hydroxy   5. Abnormal LFTs  - Hepatic function panel - Copper, Free - Gamma GT  6. Low serum copper for age  - Copper, Free  7. Medication management  - CBC with Differential/Platelet - BASIC METABOLIC PANEL WITH GFR - Hepatic function panel - Magnesium - Lipid panel - TSH - Hemoglobin A1c - Insulin, random - VITAMIN D 25 Hydroxy   8. Fatty liver       Discussed  regular exercise, BP monitoring, weight control to achieve/maintain BMI less than 25 and discussed med and SE's. Recommended labs to assess and monitor clinical status with further disposition pending results of labs. Over 30 minutes of exam, counseling, chart review was performed.

## 2016-09-04 NOTE — Patient Instructions (Signed)

## 2016-09-05 ENCOUNTER — Encounter: Payer: Self-pay | Admitting: Internal Medicine

## 2016-09-05 ENCOUNTER — Other Ambulatory Visit: Payer: Self-pay | Admitting: Internal Medicine

## 2016-09-05 DIAGNOSIS — K760 Fatty (change of) liver, not elsewhere classified: Secondary | ICD-10-CM | POA: Insufficient documentation

## 2016-09-05 LAB — HEMOGLOBIN A1C
Hgb A1c MFr Bld: 5.6 % (ref <5.7)
Mean Plasma Glucose: 114 mg/dL

## 2016-09-05 LAB — GAMMA GT: GGT: 45 U/L (ref 3–65)

## 2016-09-05 LAB — MAGNESIUM: Magnesium: 2.2 mg/dL (ref 1.5–2.5)

## 2016-09-05 LAB — VITAMIN D 25 HYDROXY (VIT D DEFICIENCY, FRACTURES): Vit D, 25-Hydroxy: 40 ng/mL (ref 30–100)

## 2016-09-05 MED ORDER — EZETIMIBE 10 MG PO TABS: ORAL_TABLET | ORAL | 1 refills | Status: DC

## 2016-09-06 ENCOUNTER — Encounter: Payer: Self-pay | Admitting: *Deleted

## 2016-09-06 LAB — INSULIN, RANDOM: Insulin: 19.7 u[IU]/mL — ABNORMAL HIGH (ref 2.0–19.6)

## 2016-09-14 LAB — COPPER, FREE: Copper - Free, Serum/Plasma: 610 mcg/L

## 2016-09-24 ENCOUNTER — Ambulatory Visit: Payer: Self-pay | Admitting: Internal Medicine

## 2016-09-26 ENCOUNTER — Ambulatory Visit (INDEPENDENT_AMBULATORY_CARE_PROVIDER_SITE_OTHER): Payer: Medicare Other | Admitting: Internal Medicine

## 2016-09-26 VITALS — BP 138/86 | HR 72 | Temp 97.0°F | Resp 16 | Ht 64.0 in | Wt 160.2 lb

## 2016-09-26 DIAGNOSIS — M25511 Pain in right shoulder: Secondary | ICD-10-CM

## 2016-09-26 DIAGNOSIS — M542 Cervicalgia: Secondary | ICD-10-CM | POA: Diagnosis not present

## 2016-09-26 DIAGNOSIS — M2550 Pain in unspecified joint: Secondary | ICD-10-CM

## 2016-09-26 MED ORDER — DEXAMETHASONE 1 MG PO TABS: ORAL_TABLET | ORAL | 0 refills | Status: AC

## 2016-09-28 ENCOUNTER — Encounter: Payer: Self-pay | Admitting: Internal Medicine

## 2016-09-28 NOTE — Progress Notes (Signed)
  Subjective:    Patient ID: Margaret Sullivan, female    DOB: 06-Oct-1945, 71 y.o.   MRN: [id number]  HPI   This very nice 71 yo DWF presents with c/o R shoulder pains x 3 months, also bilat neck, elbow & wrist pains. She denies any falls or injury.   Medication Sig  . TYLENOL EXTRA STRENGTH Take  PRN  . VITAMIN D  5,000 Units Take   daily.   Marland Kitchen ezetimibe 10 MG  Take 1 tablet daily for Cholesterol  . fluocinonide crm  2.45 % Apply 1 application topically 2 x daily. Apply to hands.  Marland Kitchen PROBIOTIC Take by mouth daily.   Allergies  Allergen Reactions  . Ampicillin Hives and Swelling  . Aspirin Itching  . Doxycycline Itching and Swelling  . Macrodantin [Nitrofurantoin Macrocrystal] Hives, Itching and Swelling  . Percocet [Oxycodone-Acetaminophen]   . Propulsid [Cisapride] Itching and Swelling  . Voltaren [Diclofenac] Hives and Itching  . Codeine Palpitations  . Penicillins Swelling and Rash   Past Medical History:  Diagnosis Date  . Essential tremor   . GERD (gastroesophageal reflux disease)   . Heart murmur   . High cholesterol   . Hyperlipidemia   . Hypertension    labile  . Labile hypertension   . Prediabetes    Review of Systems                                                                                     10 point systems review negative except as above.    Objective:   Physical Exam  BP 138/86   Pulse 72   Temp (!) 97 F (36.1 C)   Resp 16   Ht 5' 4"  (1.626 m)   Wt 160 lb 3.2 oz (72.7 kg)   BMI 27.50 kg/m   HEENT - WNL. Neck - supple.  Chest - Clear equal BS. Cor - Nl HS. RRR w/o sig MGR. PP 1(+). No edema. MS- Has slight decrease ROM of the shoulders secondary to pain/ No point tenderness around the elbows or wrists. Gait Nl. Neuro -  Nl w/o focal abnormalities.    Assessment & Plan:   1. Right shoulder pain, unspecified chronicity  2. Neck pain  3. Arthralgia, unspecified joint - Empiric trial on shot steroid to treat & also assess response.  -  dexamethasone (DECADRON) 1 MG tablet; Take 1 tablet 3 x/day - 3 days, then 2 x/day - 3 days, then 1 x/ day  Dispense: 20 tablet; Refill: 0  - discussed prudent diet, exercise , meds and side-effects

## 2016-11-08 ENCOUNTER — Ambulatory Visit (INDEPENDENT_AMBULATORY_CARE_PROVIDER_SITE_OTHER): Payer: Medicare Other | Admitting: Physician Assistant

## 2016-11-08 ENCOUNTER — Encounter: Payer: Self-pay | Admitting: Physician Assistant

## 2016-11-08 VITALS — BP 124/68 | HR 87 | Temp 97.4°F | Resp 14 | Ht 64.0 in | Wt 158.2 lb

## 2016-11-08 DIAGNOSIS — M2550 Pain in unspecified joint: Secondary | ICD-10-CM

## 2016-11-08 DIAGNOSIS — M25511 Pain in right shoulder: Secondary | ICD-10-CM | POA: Diagnosis not present

## 2016-11-08 DIAGNOSIS — R21 Rash and other nonspecific skin eruption: Secondary | ICD-10-CM

## 2016-11-08 NOTE — Patient Instructions (Signed)
Will check labs Will send to PT Can refer to ortho if not better  You can take tylenol (552m) or tylenol arthritis (6566m with the meloxicam/antiinflammatories. The max you can take of tylenol a day is 30004maily, this is a max of 6 pills a day of the regular tyelnol (500m51mr a max of 3 a day of the tylenol arthritis (650mg32m long as no other medications you are taking contain tylenol.    Shoulder Impingement Syndrome Rehab Ask your health care provider which exercises are safe for you. Do exercises exactly as told by your health care provider and adjust them as directed. It is normal to feel mild stretching, pulling, tightness, or discomfort as you do these exercises, but you should stop right away if you feel sudden pain or your pain gets worse.Do not begin these exercises until told by your health care provider. Stretching and range of motion exercise This exercise warms up your muscles and joints and improves the movement and flexibility of your shoulder. This exercise also helps to relieve pain and stiffness. Exercise A: Passive horizontal adduction  1. Sit or stand and pull your left / right elbow across your chest, toward your other shoulder. Stop when you feel a gentle stretch in the back of your shoulder and upper arm. ? Keep your arm at shoulder height. ? Keep your arm as close to your body as you comfortably can. 2. Hold for __________ seconds. 3. Slowly return to the starting position. Repeat __________ times. Complete this exercise __________ times a day. Strengthening exercises These exercises build strength and endurance in your shoulder. Endurance is the ability to use your muscles for a long time, even after they get tired. Exercise B: External rotation, isometric 1. Stand or sit in a doorway, facing the door frame. 2. Bend your left / right elbow and place the back of your wrist against the door frame. Only your wrist should be touching the frame. Keep your upper arm  at your side. 3. Gently press your wrist against the door frame, as if you are trying to push your arm away from your abdomen. ? Avoid shrugging your shoulder while you press your hand against the door frame. Keep your shoulder blade tucked down toward the middle of your back. 4. Hold for __________ seconds. 5. Slowly release the tension, and relax your muscles completely before you do the exercise again. Repeat __________ times. Complete this exercise __________ times a day. Exercise C: Internal rotation, isometric  1. Stand or sit in a doorway, facing the door frame. 2. Bend your left / right elbow and place the inside of your wrist against the door frame. Only your wrist should be touching the frame. Keep your upper arm at your side. 3. Gently press your wrist against the door frame, as if you are trying to push your arm toward your abdomen. ? Avoid shrugging your shoulder while you press your hand against the door frame. Keep your shoulder blade tucked down toward the middle of your back. 4. Hold for __________ seconds. 5. Slowly release the tension, and relax your muscles completely before you do the exercise again. Repeat __________ times. Complete this exercise __________ times a day. Exercise D: Scapular protraction, supine  1. Lie on your back on a firm surface. Hold a __________ weight in your left / right hand. 2. Raise your left / right arm straight into the air so your hand is directly above your shoulder joint. 3. Push the weight  into the air so your shoulder lifts off of the surface that you are lying on. Do not move your head, neck, or back. 4. Hold for __________ seconds. 5. Slowly return to the starting position. Let your muscles relax completely before you repeat this exercise. Repeat __________ times. Complete this exercise __________ times a day. Exercise E: Scapular retraction  1. Sit in a stable chair without armrests, or stand. 2. Secure an exercise band to a stable  object in front of you so the band is at shoulder height. 3. Hold one end of the exercise band in each hand. Your palms should face down. 4. Squeeze your shoulder blades together and move your elbows slightly behind you. Do not shrug your shoulders while you do this. 5. Hold for __________ seconds. 6. Slowly return to the starting position. Repeat __________ times. Complete this exercise __________ times a day. Exercise F: Shoulder extension  1. Sit in a stable chair without armrests, or stand. 2. Secure an exercise band to a stable object in front of you where the band is above shoulder height. 3. Hold one end of the exercise band in each hand. 4. Straighten your elbows and lift your hands up to shoulder height. 5. Squeeze your shoulder blades together and pull your hands down to the sides of your thighs. Stop when your hands are straight down by your sides. Do not let your hands go behind your body. 6. Hold for __________ seconds. 7. Slowly return to the starting position. Repeat __________ times. Complete this exercise __________ times a day. This information is not intended to replace advice given to you by your health care provider. Make sure you discuss any questions you have with your health care provider. Document Released: 02/19/2005 Document Revised: 10/27/2015 Document Reviewed: 01/22/2015 Elsevier Interactive Patient Education  [employer].

## 2016-11-08 NOTE — Progress Notes (Signed)
Subjective:    Patient ID: Margaret Sullivan, female    DOB: 1945/09/16, 71 y.o.   MRN: [id number]  HPI 71 y.o. WF presents with continuing right shoulder pain x3-4 months, no falls or injury.  She had decadron 07/25 and she has been heating and stretches, helped some. Tylenol EX 2 a day that helps some.  She has bilateral shoulder pain with pain in her elbows, wrist and now knuckles. Worse with resting/sitting/lying down.   She has had rash bilateral hands x June, saw derm and used lidex that caused it to go away and then after stopping has restarted. On knuckles, fingers, pruritic papules.  Has had tick bite history.   Blood pressure 124/68, pulse 87, temperature (!) 97.4 F (36.3 C), resp. rate 14, height 5' 4"  (1.626 m), weight 158 lb 3.2 oz (71.8 kg), SpO2 97 %.  Medications Current Outpatient Prescriptions on File Prior to Visit  Medication Sig  . Acetaminophen (TYLENOL EXTRA STRENGTH PO) Take by mouth. PRN  . Cholecalciferol (VITAMIN D PO) Take 5,000 Units by mouth daily.   . fluocinonide cream (LIDEX) 9.62 % Apply 1 application topically 2 (two) times daily. Apply to hands.   No current facility-administered medications on file prior to visit.     Problem list She has GERD; Hyperlipidemia; Prediabetes; Hypertension; Vitamin D deficiency; Medication management; Poor compliance with medical recommendations; Abnormal LFTs; and Fatty liver on her problem list.   Review of Systems  Constitutional: Negative.  Negative for chills and fever.  HENT: Negative.  Negative for congestion and sore throat.   Respiratory: Negative.   Cardiovascular: Negative.  Negative for chest pain and leg swelling.  Gastrointestinal: Negative.  Negative for abdominal pain, constipation, diarrhea, nausea and rectal pain.  Endocrine: Negative.   Genitourinary: Negative.   Musculoskeletal: Positive for arthralgias. Negative for back pain, gait problem, joint swelling, myalgias, neck pain and neck  stiffness.  Skin: Positive for rash. Negative for color change, pallor and wound.  Neurological: Negative.  Negative for dizziness and headaches.  Hematological: Negative.   Psychiatric/Behavioral: Negative.        Objective:   Physical Exam  Constitutional: She is oriented to person, place, and time. She appears well-developed and well-nourished.  HENT:  Head: Normocephalic and atraumatic.  Right Ear: External ear normal.  Left Ear: External ear normal.  Mouth/Throat: Oropharynx is clear and moist.  Eyes: Pupils are equal, round, and reactive to light. Conjunctivae and EOM are normal.  Neck: Normal range of motion. Neck supple. No thyromegaly present.  Cardiovascular: Normal rate, regular rhythm and normal heart sounds.  Exam reveals no gallop and no friction rub.   No murmur heard. Pulmonary/Chest: Effort normal and breath sounds normal. No respiratory distress. She has no wheezes.  Abdominal: Soft. Bowel sounds are normal. She exhibits no distension and no mass. There is no tenderness. There is no rebound and no guarding.  Musculoskeletal: Normal range of motion.  Right shoulder without deformity, no warmth, redness. + tenderness at bicipital tendon, no pain with external and internal rotation, + coke can test.   Strength is normal and symmetric in arms. Good distal neurovascular exam.   Lymphadenopathy:    She has no cervical adenopathy.  Neurological: She is alert and oriented to person, place, and time. She displays normal reflexes. No cranial nerve deficit. Coordination normal.  Skin: Skin is warm and dry.  Bilateral hands with small papules along hand and fingers, comes and goes, + pruritis.   Psychiatric: She  has a normal mood and affect.       Assessment & Plan:   Polyarthralgia with rash With recent travel to [place] and tick exposure check labs, bilateral arms/shoulder Will also treat shoulder for possible OA/rotator cuff tendonitis/biciptial tendon -     CBC  with Differential/Platelet -     BASIC METABOLIC PANEL WITH GFR -     Hepatic function panel -     Sedimentation rate -     ANA -     Anti-DNA antibody, double-stranded -     Rheumatoid factor -     Rocky mtn spotted fvr abs pnl(IgG+IgM) -     Ehrlichia antibody panel -     B. Burgdorfi Antibodies  Rash Possible eczema, continue cream -     CBC with Differential/Platelet -     BASIC METABOLIC PANEL WITH GFR -     Hepatic function panel -     Sedimentation rate -     ANA -     Anti-DNA antibody, double-stranded -     Rheumatoid factor -     Rocky mtn spotted fvr abs pnl(IgG+IgM) -     Ehrlichia antibody panel -     B. Burgdorfi Antibodies  Acute pain of right shoulder -     Ambulatory referral to Physical Therapy - will refer to PT, may need ortho

## 2016-11-12 LAB — CBC WITH DIFFERENTIAL/PLATELET
BASOS ABS: 58 {cells}/uL (ref 0–200)
Basophils Relative: 0.7 %
EOS ABS: 149 {cells}/uL (ref 15–500)
Eosinophils Relative: 1.8 %
HCT: 39.8 % (ref 35.0–45.0)
HEMOGLOBIN: 13.6 g/dL (ref 11.7–15.5)
Lymphs Abs: 2042 cells/uL (ref 850–3900)
MCH: 30.3 pg (ref 27.0–33.0)
MCHC: 34.2 g/dL (ref 32.0–36.0)
MCV: 88.6 fL (ref 80.0–100.0)
MONOS PCT: 11.2 %
MPV: 10.3 fL (ref 7.5–12.5)
NEUTROS ABS: 5121 {cells}/uL (ref 1500–7800)
Neutrophils Relative %: 61.7 %
Platelets: 243 10*3/uL (ref 140–400)
RBC: 4.49 10*6/uL (ref 3.80–5.10)
RDW: 12.8 % (ref 11.0–15.0)
Total Lymphocyte: 24.6 %
WBC mixed population: 930 cells/uL (ref 200–950)
WBC: 8.3 10*3/uL (ref 3.8–10.8)

## 2016-11-12 LAB — HEPATIC FUNCTION PANEL
AG RATIO: 1.6 (calc) (ref 1.0–2.5)
ALKALINE PHOSPHATASE (APISO): 69 U/L (ref 33–130)
ALT: 46 U/L — ABNORMAL HIGH (ref 6–29)
AST: 48 U/L — ABNORMAL HIGH (ref 10–35)
Albumin: 4.9 g/dL (ref 3.6–5.1)
BILIRUBIN DIRECT: 0.1 mg/dL (ref 0.0–0.2)
BILIRUBIN INDIRECT: 0.3 mg/dL (ref 0.2–1.2)
BILIRUBIN TOTAL: 0.4 mg/dL (ref 0.2–1.2)
Globulin: 3 g/dL (calc) (ref 1.9–3.7)
Total Protein: 7.9 g/dL (ref 6.1–8.1)

## 2016-11-12 LAB — BASIC METABOLIC PANEL WITH GFR
BUN: 13 mg/dL (ref 7–25)
CO2: 23 mmol/L (ref 20–32)
CREATININE: 0.8 mg/dL (ref 0.60–0.93)
Calcium: 9.9 mg/dL (ref 8.6–10.4)
Chloride: 100 mmol/L (ref 98–110)
GFR, EST AFRICAN AMERICAN: 87 mL/min/{1.73_m2} (ref >=60)
GFR, EST NON AFRICAN AMERICAN: 75 mL/min/{1.73_m2} (ref >=60)
GLUCOSE: 94 mg/dL (ref 65–99)
Potassium: 4.5 mmol/L (ref 3.5–5.3)
SODIUM: 137 mmol/L (ref 135–146)

## 2016-11-12 LAB — EHRLICHIA ANTIBODY PANEL: E. CHAFFEENSIS AB IGG: <1:64 {titer}

## 2016-11-12 LAB — RHEUMATOID FACTOR: Rheumatoid fact SerPl-aCnc: <14 IU/mL (ref <14)

## 2016-11-12 LAB — B. BURGDORFI ANTIBODIES: B burgdorferi Ab IgG+IgM: <0.9 index

## 2016-11-12 LAB — ANA: Anti Nuclear Antibody(ANA): NEGATIVE

## 2016-11-12 LAB — ROCKY MTN SPOTTED FVR ABS PNL(IGG+IGM)
RMSF IGM: NOT DETECTED
RMSF IgG: NOT DETECTED

## 2016-11-12 LAB — ANTI-DNA ANTIBODY, DOUBLE-STRANDED: ds DNA Ab: <1 IU/mL

## 2016-11-12 LAB — SPECIMEN ID NOTIFICATION MISSING 2ND ID

## 2016-11-12 LAB — SEDIMENTATION RATE: Sed Rate: 34 mm/h — ABNORMAL HIGH (ref 0–30)

## 2016-11-13 NOTE — Progress Notes (Signed)
Pt aware of lab results & voiced understanding of those results.

## 2016-11-15 ENCOUNTER — Ambulatory Visit: Payer: Self-pay | Admitting: Internal Medicine

## 2016-11-22 ENCOUNTER — Ambulatory Visit (INDEPENDENT_AMBULATORY_CARE_PROVIDER_SITE_OTHER): Payer: Self-pay | Admitting: Orthopedic Surgery

## 2016-11-29 ENCOUNTER — Ambulatory Visit (INDEPENDENT_AMBULATORY_CARE_PROVIDER_SITE_OTHER): Payer: Medicare Other | Admitting: Orthopedic Surgery

## 2016-11-29 ENCOUNTER — Encounter (INDEPENDENT_AMBULATORY_CARE_PROVIDER_SITE_OTHER): Payer: Self-pay | Admitting: Orthopedic Surgery

## 2016-11-29 DIAGNOSIS — G8929 Other chronic pain: Secondary | ICD-10-CM

## 2016-11-29 DIAGNOSIS — M25511 Pain in right shoulder: Secondary | ICD-10-CM | POA: Diagnosis not present

## 2016-11-30 ENCOUNTER — Ambulatory Visit (INDEPENDENT_AMBULATORY_CARE_PROVIDER_SITE_OTHER): Payer: Self-pay | Admitting: Orthopedic Surgery

## 2016-12-01 NOTE — Progress Notes (Signed)
Office Visit Note   Patient: Margaret Sullivan           Date of Birth: 12-19-1945           MRN: [id number] Visit Date: 11/29/2016 Requested by: Vicie Mutters, PA-C [id number] [place], [place] [id number] PCP: Unk Pinto, MD  Subjective: Chief Complaint  Patient presents with  . Right Shoulder - Pain    HPI: Margaret Sullivan is a 71 year old patient with right shoulder pain.  She's had pain since January.  It's worse and she reinjured it in July after a fall.  She is right-hand dominant.  Physical therapy started for next week.  She wants as minimal imaging as possible and as minimal medications as possible.  She describes multiple joint problems on all sides related to a tick bite.  She had neck surgery in 2001.  She takes about 3, all per day for her symptoms              ROS: All systems reviewed are negative as they relate to the chief complaint within the history of present illness.  Patient denies  fevers or chills.   Assessment & Plan: Visit Diagnoses:  1. Chronic right shoulder pain     Plan: Impression is right shoulder pain probably some component of bursitis and tendinitis but strength why she looks good.  She wants to try therapy first.  I don't see a definite structural problem in the shoulder.  Radiographs would be potentially indicated that she wants to hold off on imaging.  I would say for symptoms persist for 3 more months and imaging would be indicated at least plain x-rays of the shoulder.  Because of consider an injection into the subacromial space if her symptoms worsen.  All this is discussed.  Rotator cuff strengthening indicated in therapy at this time I will see her back as needed  Follow-Up Instructions: Return if symptoms worsen or fail to improve.   Orders:  No orders of the defined types were placed in this encounter.  No orders of the defined types were placed in this encounter.     Procedures: No procedures performed   Clinical  Data: No additional findings.  Objective: Vital Signs: There were no vitals taken for this visit.  Physical Exam:   Constitutional: Patient appears well-developed HEENT:  Head: Normocephalic Eyes:EOM are normal Neck: Normal range of motion Cardiovascular: Normal rate Pulmonary/chest: Effort normal Neurologic: Patient is alert Skin: Skin is warm Psychiatric: Patient has normal mood and affect    Ortho Exam: Orthopedic exam demonstrates good cervical spine range of motion.  5 out of 5 grip EPL FPL interosseous wrist flexion and wrist extension biceps triceps and upper strength.  No masses lymph adenopathy or skin changes noted in the right shoulder girdle region.  Motor sensory function to the hand is intact no restriction of passive range of motion on the right-hand side.  Rotator cuff strength intact on the right and left to infraspinatus supraspinatus and subscap muscle testing.  No other masses lymph adenopathy or skin changes noted in the shoulder girdle region  Specialty Comments:  No specialty comments available.  Imaging: No results found.   PMFS History: Patient Active Problem List   Diagnosis Date Noted  . Fatty liver 09/05/2016  . Abnormal LFTs 11/04/2015  . Poor compliance with medical recommendations 04/29/2014  . Vitamin D deficiency 06/10/2013  . Medication management 06/10/2013  . GERD   . Hyperlipidemia   . Prediabetes   .  Hypertension    Past Medical History:  Diagnosis Date  . Essential tremor   . GERD (gastroesophageal reflux disease)   . Heart murmur   . High cholesterol   . Hyperlipidemia   . Hypertension    labile  . Labile hypertension   . Prediabetes     Family History  Problem Relation Age of Onset  . Lymphoma Mother   . Heart attack Father     Past Surgical History:  Procedure Laterality Date  . BACK SURGERY     Social History   Occupational History  . Not on file.   Social History Main Topics  . Smoking status: Never  Smoker  . Smokeless tobacco: Never Used  . Alcohol use No  . Drug use: No  . Sexual activity: Not on file

## 2016-12-05 ENCOUNTER — Ambulatory Visit (INDEPENDENT_AMBULATORY_CARE_PROVIDER_SITE_OTHER): Payer: Self-pay | Admitting: Orthopedic Surgery

## 2017-02-19 ENCOUNTER — Encounter: Payer: Self-pay | Admitting: Physician Assistant

## 2017-06-05 ENCOUNTER — Encounter: Payer: Self-pay | Admitting: Adult Health

## 2017-06-07 ENCOUNTER — Encounter: Payer: Self-pay | Admitting: Physician Assistant

## 2017-06-13 ENCOUNTER — Ambulatory Visit: Payer: Self-pay | Admitting: Physician Assistant

## 2017-07-01 NOTE — Progress Notes (Signed)
WELLNESS  Assessment and Plan: Hypertension - continue medications, DASH diet, exercise and monitor at home. Call if greater than 130/80.  - CBC with Differential/Platelet - BASIC METABOLIC PANEL WITH GFR - Hepatic function panel - TSH - Urinalysis, Routine w reflex microscopic (not at [facility]) - Microalbumin / creatinine urine ratio   Hyperlipidemia -continue medications, check lipids, decrease fatty foods, increase activity.  - Lipid panel  Prediabetes Discussed general issues about diabetes pathophysiology and management., Educational material distributed., Suggested low cholesterol diet., Encouraged aerobic exercise., Discussed foot care., Reminded to get yearly retinal exam.   Vitamin D deficiency   GERD Continue PPI/H2 blocker, diet discussed  Medication management  Poor compliance with medical recommendations Declines vaccinations  Medicare wellness Get MGM, not willing to do colonoscopy, will do cologuard.   Fatty liver Weight loss advised, will monitor LFTs  Encounter for Medicare annual wellness exam 1 year  BMI 27.0-27.9,adult  Overweight  - long discussion about weight loss, diet, and exercise -recommended diet heavy in fruits and veggies and low in animal meats, cheeses, and dairy products  Screen for colon cancer -     Cologuard  Discussed med's effects and SE's. Screening labs and tests as requested with regular follow-up as recommended. Over 30 minutes of exam, counseling, chart review, and complex, high level critical decision making was performed this visit.  Future Appointments  Date Time Provider [place]  07/14/2018 11:15 AM Vicie Mutters, PA-C [id number] None    Medicare Attestation I have personally reviewed: The patient's medical and social history Their use of alcohol, tobacco or illicit drugs Their current medications and supplements The patient's functional ability including ADLs,fall risks, home safety risks, cognitive, and  hearing and visual impairment Diet and physical activities Evidence for depression or mood disorders  The patient's weight, height, BMI, and visual acuity have been recorded in the chart.  I have made referrals, counseling, and provided education to the patient based on review of the above and I have provided the patient with a written personalized care plan for preventive services.    HPI  72 y.o. female  presents for medicare wellness and follow up.   She has right lateral hip pain, down her lateral leg, to her lateral right ankle. She has no injury. She has seen Dr. Marlou Sa in the past. Worse with sitting, worse with lying down.   Her blood pressure has been controlled at home, she is controlling diet/exercise, today their BP is BP: 138/86  She does workout. She denies chest pain, shortness of breath, dizziness.  She is not on cholesterol medication, she declines statins.  Her cholesterol is not at goal. The cholesterol last visit was:   Lab Results  Component Value Date   CHOL 228 (H) 09/04/2016   HDL 37 (L) 09/04/2016   LDLCALC 160 (H) 09/04/2016   TRIG 153 (H) 09/04/2016   CHOLHDL 6.2 (H) 09/04/2016   She has been working on diet and exercise for prediabetes, and denies paresthesia of the feet, polydipsia, polyuria and visual disturbances. Last A1C in the office was:  Lab Results  Component Value Date   HGBA1C 5.6 09/04/2016  Patient is on Vitamin D supplement.   Lab Results  Component Value Date   VD25OH 40 09/04/2016  Retired Pharmacist, hospital.  She has had elevated LFTs, negative copper Lab Results  Component Value Date   ALT 46 (H) 11/08/2016   AST 48 (H) 11/08/2016   ALKPHOS 71 09/04/2016   BILITOT 0.4 11/08/2016   Has  had pneumonia 3 times after procedures and would prefer to avoid colonoscopy.  BMI is Body mass index is 27.43 kg/m., she is working on diet and exercise. Wt Readings from Last 3 Encounters:  07/03/17 159 lb 12.8 oz (72.5 kg)  11/08/16 158 lb 3.2 oz (71.8 kg)   09/26/16 160 lb 3.2 oz (72.7 kg)    Current Medications:  Current Outpatient Medications on File Prior to Visit  Medication Sig Dispense Refill  . Acetaminophen (TYLENOL EXTRA STRENGTH PO) Take by mouth. PRN    . Cholecalciferol (VITAMIN D PO) Take 5,000 Units by mouth daily.     . fluocinonide cream (LIDEX) 6.30 % Apply 1 application topically 2 (two) times daily. Apply to hands.     No current facility-administered medications on file prior to visit.    Health Maintenance:   Immunization History  Administered Date(s) Administered  . Pneumococcal-Unspecified 03/05/1997  . Td 03/05/1998   Last colonoscopy: 2008 DUE for this and would like to do cologuard  Last mammogram: 07/2013 normal, DUE Last pap smear/pelvic exam: 2010, declines another DEXA: 6-7 years, normal, declines another Hep panel neg 2013 [place] AB 2013, + gallstone US neck 2013  Prior vaccinations: TD or Tdap: 2000, will check Influenza: declines Pneumococcal: 1999 Prevnar 13: declines Shingles/Zostavax: declines  Last Dental Exam: Dr. Gloriann Loan, June 2017 Last Eye Exam: Dr. Delman Cheadle, wears glasses, 01/2016 Patient Care Team: Unk Pinto, MD as PCP - General (Internal Medicine) Sharyne Peach, MD as Consulting Physician (Ophthalmology) Earley Favor (Dentistry) Lafayette Dragon, MD (Inactive) as Consulting Physician (Gastroenterology)  Medical History:  Past Medical History:  Diagnosis Date  . Essential tremor   . GERD (gastroesophageal reflux disease)   . Heart murmur   . High cholesterol   . Hyperlipidemia   . Hypertension    labile  . Labile hypertension   . Prediabetes    Allergies Allergies  Allergen Reactions  . Ampicillin Hives and Swelling  . Aspirin Itching  . Doxycycline Itching and Swelling  . Macrodantin [Nitrofurantoin Macrocrystal] Hives, Itching and Swelling  . Percocet [Oxycodone-Acetaminophen]   . Propulsid [Cisapride] Itching and Swelling  . Voltaren [Diclofenac] Hives and  Itching  . Codeine Palpitations  . Penicillins Swelling and Rash    SURGICAL HISTORY She  has a past surgical history that includes Back surgery. FAMILY HISTORY Her family history includes Heart attack in her father; Lymphoma in her mother. SOCIAL HISTORY She  reports that she has never smoked. She has never used smokeless tobacco. She reports that she does not drink alcohol or use drugs.  MEDICARE WELLNESS OBJECTIVES: Physical activity: Current Exercise Habits: The patient does not participate in regular exercise at present Cardiac risk factors: Cardiac Risk Factors include: advanced age (>27mn, >>39women);dyslipidemia;hypertension;sedentary lifestyle Depression/mood screen:   [facility] 07/03/2017  Decreased Interest 0  Down, Depressed, Hopeless 0  PHQ - 2 Score 0    ADLs:  In your present state of health, do you have any difficulty performing the following activities: 07/03/2017 09/28/2016  Hearing? N N  Vision? N N  Difficulty concentrating or making decisions? N N  Walking or climbing stairs? N N  Dressing or bathing? N N  Doing errands, shopping? N N  Some recent data might be hidden     Cognitive Testing  Alert? Yes  Normal Appearance?Yes  Oriented to person? Yes  Place? Yes   Time? Yes  Recall of three objects?  Yes  Can perform simple calculations? Yes  Displays appropriate  judgment?Yes  Can read the correct time from a watch face?Yes  EOL planning: Does Patient Have a Medical Advance Directive?: No Would patient like information on creating a medical advance directive?: Yes (MAU/Ambulatory/Procedural Areas - Information given)    Review of Systems: Review of Systems  Constitutional: Negative.   HENT: Negative.   Eyes: Negative.   Respiratory: Negative.  Negative for shortness of breath.   Cardiovascular: Negative.  Negative for chest pain.  Gastrointestinal: Negative.        + hemorrhoid  Genitourinary: Negative.   Musculoskeletal:  Negative.  Negative for falls.  Skin: Negative.   Neurological: Negative.   Psychiatric/Behavioral: Negative.  Negative for depression.    Physical Exam: Estimated body mass index is 27.43 kg/m as calculated from the following:   Height as of this encounter: 5' 4"  (1.626 m).   Weight as of this encounter: 159 lb 12.8 oz (72.5 kg). BP 138/86   Pulse 75   Temp (!) 97.3 F (36.3 C)   Resp 16   Ht 5' 4"  (1.626 m)   Wt 159 lb 12.8 oz (72.5 kg)   SpO2 98%   BMI 27.43 kg/m  General Appearance: Well nourished, in no apparent distress.  Eyes: PERRLA, EOMs, conjunctiva no swelling or erythema, normal fundi and vessels.  Sinuses: No Frontal/maxillary tenderness  ENT/Mouth: Ext aud canals clear, normal light reflex with TMs without erythema, bulging. Good dentition. No erythema, swelling, or exudate on post pharynx. Tonsils not swollen or erythematous. Hearing normal.  Neck: Supple, thyroid normal. No bruits  Respiratory: Respiratory effort normal, BS equal bilaterally without rales, rhonchi, wheezing or stridor.  Cardio: RRR without murmurs, rubs or gallops. Brisk peripheral pulses without edema.  Chest: symmetric, with normal excursions and percussion.  Breasts: Symmetric, without lumps, nipple discharge, retractions.  Abdomen: Soft, nontender, no guarding, rebound, hernias, masses, or organomegaly.  Lymphatics: Non tender without lymphadenopathy.  Genitourinary: defer Musculoskeletal: Full ROM all peripheral extremities,5/5 strength, and normal gait.  Skin: Warm, dry without rashes, lesions, ecchymosis. Neuro: Cranial nerves intact, reflexes equal bilaterally. Normal muscle tone, no cerebellar symptoms. Sensation intact.  Psych: Awake and oriented X 3, normal affect, Insight and Judgment appropriate.    Medicare Attestation I have personally reviewed: The patient's medical and social history Their use of alcohol, tobacco or illicit drugs Their current medications and  supplements The patient's functional ability including ADLs,fall risks, home safety risks, cognitive, and hearing and visual impairment Diet and physical activities Evidence for depression or mood disorders  The patient's weight, height, BMI, and visual acuity have been recorded in the chart.  I have made referrals, counseling, and provided education to the patient based on review of the above and I have provided the patient with a written personalized care plan for preventive services.     Vicie Mutters 1:35 PM [facility]

## 2017-07-03 ENCOUNTER — Encounter: Payer: Self-pay | Admitting: Physician Assistant

## 2017-07-03 ENCOUNTER — Ambulatory Visit: Payer: Medicare Other | Admitting: Physician Assistant

## 2017-07-03 VITALS — BP 138/86 | HR 75 | Temp 97.3°F | Resp 16 | Ht 64.0 in | Wt 159.8 lb

## 2017-07-03 DIAGNOSIS — R0989 Other specified symptoms and signs involving the circulatory and respiratory systems: Secondary | ICD-10-CM

## 2017-07-03 DIAGNOSIS — K2100 Gastro-esophageal reflux disease with esophagitis, without bleeding: Secondary | ICD-10-CM

## 2017-07-03 DIAGNOSIS — R7989 Other specified abnormal findings of blood chemistry: Secondary | ICD-10-CM | POA: Diagnosis not present

## 2017-07-03 DIAGNOSIS — R779 Abnormality of plasma protein, unspecified: Secondary | ICD-10-CM

## 2017-07-03 DIAGNOSIS — Z6827 Body mass index (BMI) 27.0-27.9, adult: Secondary | ICD-10-CM | POA: Diagnosis not present

## 2017-07-03 DIAGNOSIS — R7303 Prediabetes: Secondary | ICD-10-CM

## 2017-07-03 DIAGNOSIS — Z9119 Patient's noncompliance with other medical treatment and regimen: Secondary | ICD-10-CM

## 2017-07-03 DIAGNOSIS — E559 Vitamin D deficiency, unspecified: Secondary | ICD-10-CM

## 2017-07-03 DIAGNOSIS — K760 Fatty (change of) liver, not elsewhere classified: Secondary | ICD-10-CM

## 2017-07-03 DIAGNOSIS — Z79899 Other long term (current) drug therapy: Secondary | ICD-10-CM | POA: Diagnosis not present

## 2017-07-03 DIAGNOSIS — Z1211 Encounter for screening for malignant neoplasm of colon: Secondary | ICD-10-CM

## 2017-07-03 DIAGNOSIS — E8809 Other disorders of plasma-protein metabolism, not elsewhere classified: Secondary | ICD-10-CM

## 2017-07-03 DIAGNOSIS — E782 Mixed hyperlipidemia: Secondary | ICD-10-CM | POA: Diagnosis not present

## 2017-07-03 DIAGNOSIS — Z91199 Patient's noncompliance with other medical treatment and regimen due to unspecified reason: Secondary | ICD-10-CM

## 2017-07-03 DIAGNOSIS — Z0000 Encounter for general adult medical examination without abnormal findings: Secondary | ICD-10-CM

## 2017-07-03 LAB — CBC WITH DIFFERENTIAL/PLATELET
BASOS ABS: 78 {cells}/uL (ref 0–200)
Basophils Relative: 0.6 %
EOS ABS: 195 {cells}/uL (ref 15–500)
EOS PCT: 1.5 %
HCT: 41.2 % (ref 35.0–45.0)
Hemoglobin: 14.2 g/dL (ref 11.7–15.5)
Lymphs Abs: 2847 cells/uL (ref 850–3900)
MCH: 30.3 pg (ref 27.0–33.0)
MCHC: 34.5 g/dL (ref 32.0–36.0)
MCV: 87.8 fL (ref 80.0–100.0)
MONOS PCT: 8.5 %
MPV: 10.7 fL (ref 7.5–12.5)
NEUTROS PCT: 67.5 %
Neutro Abs: 8775 cells/uL — ABNORMAL HIGH (ref 1500–7800)
Platelets: 316 10*3/uL (ref 140–400)
RBC: 4.69 10*6/uL (ref 3.80–5.10)
RDW: 12.3 % (ref 11.0–15.0)
TOTAL LYMPHOCYTE: 21.9 %
WBC mixed population: 1105 cells/uL — ABNORMAL HIGH (ref 200–950)
WBC: 13 10*3/uL — ABNORMAL HIGH (ref 3.8–10.8)

## 2017-07-03 LAB — TSH: TSH: 3.06 m[IU]/L (ref 0.40–4.50)

## 2017-07-03 NOTE — Patient Instructions (Addendum)
[facility]  7 a.m.-6:30 p.m., Monday 7 a.m.-5 p.m., Tuesday-Friday Schedule an appointment by calling [phone].  Statin therapy  Would do crestor low dose 10-20 mg 3 days a week  In addition to the known lipid-lowering effects, statins are now widely accepted to have anti-inflammatory and immunomodulatory effects. Adjunctive use of statins has proven beneficial in the context of a wide range of inflammatory diseases, including rheumatoid arthritis.  Research has shown that the salutary effect of statins on cholesterol may not be their only benefit. Statin therapy has shown promise for everything from fighting viral infections to protecting the eye from cataracts.  A 2005 study of more than 700 hospital patients being treated for pneumonia found that the death rate was more than twice as high among those who were not using statins. In 2006, a [place] study examined the rate of sepsis, a deadly blood infection, among patients who had been hospitalized for heart events. In the two years after their hospitalization, the statin users had a rate of sepsis 19% lower than that of the non-statin users. A 2009 review of 22 studies found that statins appeared to have a beneficial effect on the outcome of infection, but they couldn't come to a firm conclusion.   Tumeric with black pepper extract is a great natural antiinflammatory that helps with arthritis and aches and pain. Can get from [employer] or any health food store. Need to take at least 878m twice a day with food.    Try the exercises.   Go to the ER if you have any new weakness in your legs, have trouble controlling your urine or bowels, or have worsening pain.   If you are not better in 1-3 month we will refer you to ortho   Back pain Rehab Ask your health care provider which exercises are safe for you. Do exercises exactly as told by your health care provider and adjust them as directed. It is normal to  feel mild stretching, pulling, tightness, or discomfort as you do these exercises, but you should stop right away if you feel sudden pain or your pain gets worse.Do not begin these exercises until told by your health care provider. Stretching and range of motion exercises These exercises warm up your muscles and joints and improve the movement and flexibility of your hips and your back. These exercises also help to relieve pain, numbness, and tingling. Exercise A: Sciatic nerve glide 1. Sit in a chair with your head facing down toward your chest. Place your hands behind your back. Let your shoulders slump forward. 2. Slowly straighten one of your knees while you tilt your head back as if you are looking toward the ceiling. Only straighten your leg as far as you can without making your symptoms worse. 3. Hold for __________ seconds. 4. Slowly return to the starting position. 5. Repeat with your other leg. Repeat __________ times. Complete this exercise __________ times a day. Exercise B: Knee to chest with hip adduction and internal rotation  1. Lie on your back on a firm surface with both legs straight. 2. Bend one of your knees and move it up toward your chest until you feel a gentle stretch in your lower back and buttock. Then, move your knee toward the shoulder that is on the opposite side from your leg. ? Hold your leg in this position by holding onto the front of your knee. 3. Hold for __________ seconds. 4. Slowly return to the starting  position. 5. Repeat with your other leg. Repeat __________ times. Complete this exercise __________ times a day. Exercise C: Prone extension on elbows  1. Lie on your abdomen on a firm surface. A bed may be too soft for this exercise. 2. Prop yourself up on your elbows. 3. Use your arms to help lift your chest up until you feel a gentle stretch in your abdomen and your lower back. ? This will place some of your body weight on your elbows. If this is  uncomfortable, try stacking pillows under your chest. ? Your hips should stay down, against the surface that you are lying on. Keep your hip and back muscles relaxed. 4. Hold for __________ seconds. 5. Slowly relax your upper body and return to the starting position. Repeat __________ times. Complete this exercise __________ times a day. Strengthening exercises These exercises build strength and endurance in your back. Endurance is the ability to use your muscles for a long time, even after they get tired. Exercise D: Pelvic tilt 1. Lie on your back on a firm surface. Bend your knees and keep your feet flat. 2. Tense your abdominal muscles. Tip your pelvis up toward the ceiling and flatten your lower back into the floor. ? To help with this exercise, you may place a small towel under your lower back and try to push your back into the towel. 3. Hold for __________ seconds. 4. Let your muscles relax completely before you repeat this exercise. Repeat __________ times. Complete this exercise __________ times a day. Exercise E: Alternating arm and leg raises  1. Get on your hands and knees on a firm surface. If you are on a hard floor, you may want to use padding to cushion your knees, such as an exercise mat. 2. Line up your arms and legs. Your hands should be below your shoulders, and your knees should be below your hips. 3. Lift your left leg behind you. At the same time, raise your right arm and straighten it in front of you. ? Do not lift your leg higher than your hip. ? Do not lift your arm higher than your shoulder. ? Keep your abdominal and back muscles tight. ? Keep your hips facing the ground. ? Do not arch your back. ? Keep your balance carefully, and do not hold your breath. 4. Hold for __________ seconds. 5. Slowly return to the starting position and repeat with your right leg and your left arm. Repeat __________ times. Complete this exercise __________ times a day. Posture and  body mechanics  Body mechanics refers to the movements and positions of your body while you do your daily activities. Posture is part of body mechanics. Good posture and healthy body mechanics can help to relieve stress in your body's tissues and joints. Good posture means that your spine is in its natural S-curve position (your spine is neutral), your shoulders are pulled back slightly, and your head is not tipped forward. The following are general guidelines for applying improved posture and body mechanics to your everyday activities. Standing   When standing, keep your spine neutral and your feet about hip-width apart. Keep a slight bend in your knees. Your ears, shoulders, and hips should line up.  When you do a task in which you stand in one place for a long time, place one foot up on a stable object that is 2-4 inches (5-10 cm) high, such as a footstool. This helps keep your spine neutral. Sitting   When sitting,  keep your spine neutral and keep your feet flat on the floor. Use a footrest, if necessary, and keep your thighs parallel to the floor. Avoid rounding your shoulders, and avoid tilting your head forward.  When working at a desk or a computer, keep your desk at a height where your hands are slightly lower than your elbows. Slide your chair under your desk so you are close enough to maintain good posture.  When working at a computer, place your monitor at a height where you are looking straight ahead and you do not have to tilt your head forward or downward to look at the screen. Resting   When lying down and resting, avoid positions that are most painful for you.  If you have pain with activities such as sitting, bending, stooping, or squatting (flexion-based activities), lie in a position in which your body does not bend very much. For example, avoid curling up on your side with your arms and knees near your chest (fetal position).  If you have pain with activities such as  standing for a long time or reaching with your arms (extension-based activities), lie with your spine in a neutral position and bend your knees slightly. Try the following positions: ? Lying on your side with a pillow between your knees. ? Lying on your back with a pillow under your knees. Lifting   When lifting objects, keep your feet at least shoulder-width apart and tighten your abdominal muscles.  Bend your knees and hips and keep your spine neutral. It is important to lift using the strength of your legs, not your back. Do not lock your knees straight out.  Always ask for help to lift heavy or awkward objects. This information is not intended to replace advice given to you by your health care provider. Make sure you discuss any questions you have with your health care provider. Document Released: 02/19/2005 Document Revised: 10/27/2015 Document Reviewed: 11/05/2014 Elsevier Interactive Patient Education  2018 [place]  Silent reflux: Not all heartburn burns...Marland KitchenMarland KitchenMarland Kitchen  What is LPR? Laryngopharyngeal reflux (LPR) or silent reflux is a condition in which acid that is made in the stomach travels up the esophagus (swallowing tube) and gets to the throat. Not everyone with reflux has a lot of heartburn or indigestion. In fact, many people with LPR never have heartburn. This is why LPR is called SILENT REFLUX, and the terms "Silent reflux" and "LPR" are often used interchangeably. Because LPR is silent, it is sometimes difficult to diagnose.  How can you tell if you have LPR?  Marland Kitchen Chronic hoarseness- Some people have hoarseness that comes and goes . throat clearing  . Cough . It can cause shortness of breath and cause asthma like symptoms. Marland Kitchen a feeling of a lump in the throat  . difficulty swallowing . a problem with too much nose and throat drainage.  . Some people will feel their esophagus spasm which feels like their heart beating hard and fast, this will usually be after a meal, at  rest, or lying down at night.    How do I treat this? Treatment for LPR should be individualized, and your doctor will suggest the best treatment for you. Generally there are several treatments for LPR: . changing habits and diet to reduce reflux,  . medications to reduce stomach acid, and  . surgery to prevent reflux. Most people with LPR need to modify how and when they eat, as well as take some medication, to get well. Sometimes, nonprescription  liquid antacids, such as Maalox, Gelucil and Mylanta are recommended. When used, these antacids should be taken four times each day - one tablespoon one hour after each meal and before bedtime. Dietary and lifestyle changes alone are not often enough to control LPR - medications that reduce stomach acid are also usually needed. These must be prescribed by our doctor.   TIPS FOR REDUCING REFLUX AND LPR Control your LIFE-STYLE and your DIET! Marland Kitchen If you use tobacco, QUIT.  Marland Kitchen Smoking makes you reflux. After every cigarette you have some LPR.  . Don't wear clothing that is too tight, especially around the waist (trousers, corsets, belts).  . Do not lie down just after eating...in fact, do not eat within three hours of bedtime.  . You should be on a low-fat diet.  . Limit your intake of red meat.  . Limit your intake of butter.  Marland Kitchen Avoid fried foods.  . Avoid chocolate  . Avoid cheese.  Marland Kitchen Avoid eggs. Marland Kitchen Specifically avoid caffeine (especially coffee and tea), soda pop (especially cola) and mints.  . Avoid alcoholic beverages, particularly in the evening.  Your ears and sinuses are connected by the eustachian tube. When your sinuses are inflamed, this can close off the tube and cause fluid to collect in your middle ear. This can then cause dizziness, popping, clicking, ringing, and echoing in your ears. This is often NOT an infection and does NOT require antibiotics, it is caused by inflammation so the treatments help the inflammation. This can take a  long time to get better so please be patient.  Here are things you can do to help with this: - Try the Flonase or Nasonex. Remember to spray each nostril twice towards the outer part of your eye.  Do not sniff but instead pinch your nose and tilt your head back to help the medicine get into your sinuses.  The best time to do this is at bedtime.Stop if you get blurred vision or nose bleeds.  -While drinking fluids, pinch and hold nose close and swallow, to help open eustachian tubes to drain fluid behind ear drums. -Please pick one of the over the counter allergy medications below and take it once daily for allergies.  It will also help with fluid behind ear drums. Claritin or loratadine cheapest but likely the weakest  Zyrtec or certizine at night because it can make you sleepy The strongest is allegra or fexafinadine  Cheapest at [employer], [employer], [employer] -can use decongestant over the counter, please do not use if you have high blood pressure or certain heart conditions.   if worsening HA, changes vision/speech, imbalance, weakness go to the ER   Piriformis Syndrome Rehab Ask your health care provider which exercises are safe for you. Do exercises exactly as told by your health care provider and adjust them as directed. It is normal to feel mild stretching, pulling, tightness, or discomfort as you do these exercises, but you should stop right away if you feel sudden pain or your pain gets worse.Do not begin these exercises until told by your health care provider. Stretching and range of motion exercises These exercises warm up your muscles and joints and improve the movement and flexibility of your hip and pelvis. These exercises also help to relieve pain, numbness, and tingling. Exercise A: Hip rotators  1. Lie on your back on a firm surface. 2. Pull your left / right knee toward your same shoulder with your left / right hand until your knee  is pointing toward the ceiling. Hold your left /  right ankle with your other hand. 3. Keeping your knee steady, gently pull your left / right ankle toward your other shoulder until you feel a stretch in your buttocks. 4. Hold this position for __________ seconds. Repeat __________ times. Complete this stretch __________ times a day. Exercise B: Hip extensors 1. Lie on your back on a firm surface. Both of your legs should be straight. 2. Pull your left / right knee to your chest. Hold your leg in this position by holding onto the back of your thigh or the front of your knee. 3. Hold this position for __________ seconds. 4. Slowly return to the starting position. Repeat __________ times. Complete this stretch __________ times a day. Strengthening exercises These exercises build strength and endurance in your hip and thigh muscles. Endurance is the ability to use your muscles for a long time, even after they get tired. Exercise C: Straight leg raises ( hip abductors) 1. Lie on your side with your left / right leg in the top position. Lie so your head, shoulder, knee, and hip line up. Bend your bottom knee to help you balance. 2. Lift your top leg up 4-6 inches (10-15 cm), keeping your toes pointed straight ahead. 3. Hold this position for __________ seconds. 4. Slowly lower your leg to the starting position. Let your muscles relax completely. Repeat __________ times. Complete this exercise__________ times a day. Exercise D: Hip abductors and rotators, quadruped  1. Get on your hands and knees on a firm, lightly padded surface. Your hands should be directly below your shoulders, and your knees should be directly below your hips. 2. Lift your left / right knee out to the side. Keep your knee bent. Do not twist your body. 3. Hold this position for __________ seconds. 4. Slowly lower your leg. Repeat __________ times. Complete this exercise__________ times a day. Exercise E: Straight leg raises ( hip extensors) 1. Lie on your abdomen on a bed  or a firm surface with a pillow under your hips. 2. Squeeze your buttock muscles and lift your left / right thigh off the bed. Do not let your back arch. 3. Hold this position for __________ seconds. 4. Slowly return to the starting position. Let your muscles relax completely before doing another repetition. Repeat __________ times. Complete this exercise__________ times a day. This information is not intended to replace advice given to you by your health care provider. Make sure you discuss any questions you have with your health care provider. Document Released: 02/19/2005 Document Revised: 10/25/2015 Document Reviewed: 02/01/2015 Elsevier Interactive Patient Education  [employer].

## 2017-07-04 LAB — COMPLETE METABOLIC PANEL WITH GFR
AG RATIO: 1.6 (calc) (ref 1.0–2.5)
ALBUMIN MSPROF: 5.1 g/dL (ref 3.6–5.1)
ALT: 45 U/L — ABNORMAL HIGH (ref 6–29)
AST: 57 U/L — ABNORMAL HIGH (ref 10–35)
Alkaline phosphatase (APISO): 75 U/L (ref 33–130)
BILIRUBIN TOTAL: 0.6 mg/dL (ref 0.2–1.2)
BUN: 13 mg/dL (ref 7–25)
CHLORIDE: 102 mmol/L (ref 98–110)
CO2: 29 mmol/L (ref 20–32)
Calcium: 9.8 mg/dL (ref 8.6–10.4)
Creat: 0.8 mg/dL (ref 0.60–0.93)
GFR, EST AFRICAN AMERICAN: 86 mL/min/{1.73_m2} (ref >=60)
GFR, Est Non African American: 74 mL/min/{1.73_m2} (ref >=60)
GLUCOSE: 113 mg/dL — AB (ref 65–99)
Globulin: 3.1 g/dL (calc) (ref 1.9–3.7)
Potassium: 5.3 mmol/L (ref 3.5–5.3)
Sodium: 139 mmol/L (ref 135–146)
TOTAL PROTEIN: 8.2 g/dL — AB (ref 6.1–8.1)

## 2017-07-04 LAB — LIPID PANEL
Cholesterol: 222 mg/dL — ABNORMAL HIGH (ref <200)
HDL: 40 mg/dL — ABNORMAL LOW (ref >50)
LDL CHOLESTEROL (CALC): 156 mg/dL — AB
NON-HDL CHOLESTEROL (CALC): 182 mg/dL — AB (ref <130)
Total CHOL/HDL Ratio: 5.6 (calc) — ABNORMAL HIGH (ref <5.0)
Triglycerides: 136 mg/dL (ref <150)

## 2017-07-04 LAB — MAGNESIUM: MAGNESIUM: 2.4 mg/dL (ref 1.5–2.5)

## 2017-07-04 NOTE — Addendum Note (Signed)
Addended by: Vicie Mutters R on: 07/04/2017 07:28 AM   Modules accepted: Orders

## 2017-09-03 ENCOUNTER — Telehealth: Payer: Self-pay | Admitting: Internal Medicine

## 2017-09-03 NOTE — Telephone Encounter (Signed)
Called patient to get Cologuard status per Dr Melford Aase. Patient states, she Will complete Cologuard in July. Has been out of town. Cologuard has left numerous vm, and spoken with patient 3 times. Not pleased that she had to explain 3 different times, the delay in beginning test was due to traveling. She thought she had time to think about test.

## 2017-09-10 ENCOUNTER — Ambulatory Visit: Payer: Medicare Other

## 2017-09-10 DIAGNOSIS — R779 Abnormality of plasma protein, unspecified: Secondary | ICD-10-CM

## 2017-09-10 DIAGNOSIS — R7989 Other specified abnormal findings of blood chemistry: Secondary | ICD-10-CM | POA: Diagnosis not present

## 2017-09-10 DIAGNOSIS — E8809 Other disorders of plasma-protein metabolism, not elsewhere classified: Secondary | ICD-10-CM | POA: Diagnosis not present

## 2017-09-10 NOTE — Progress Notes (Signed)
PT REPORTS FOR LAB BLD WORK. LABS ARE IN Epic. PT WAS GIVEN NUMBERS TO SCHEDULE HER [facility], PT VOICED UNDERSTANDING.

## 2017-09-11 ENCOUNTER — Ambulatory Visit: Payer: Self-pay

## 2017-09-13 LAB — HEPATIC FUNCTION PANEL
AG Ratio: 1.6 (calc) (ref 1.0–2.5)
ALBUMIN MSPROF: 4.7 g/dL (ref 3.6–5.1)
ALT: 48 U/L — ABNORMAL HIGH (ref 6–29)
AST: 52 U/L — ABNORMAL HIGH (ref 10–35)
Alkaline phosphatase (APISO): 75 U/L (ref 33–130)
Bilirubin, Direct: 0.1 mg/dL (ref 0.0–0.2)
GLOBULIN: 2.9 g/dL (ref 1.9–3.7)
Indirect Bilirubin: 0.6 mg/dL (calc) (ref 0.2–1.2)
TOTAL PROTEIN: 7.6 g/dL (ref 6.1–8.1)
Total Bilirubin: 0.7 mg/dL (ref 0.2–1.2)

## 2017-09-13 LAB — PROTEIN ELECTROPHORESIS, SERUM, WITH REFLEX
ALBUMIN ELP: 4.5 g/dL (ref 3.8–4.8)
Alpha 1: 0.3 g/dL (ref 0.2–0.3)
Alpha 2: 0.8 g/dL (ref 0.5–0.9)
BETA GLOBULIN: 0.4 g/dL (ref 0.4–0.6)
Beta 2: 0.4 g/dL (ref 0.2–0.5)
Gamma Globulin: 1.1 g/dL (ref 0.8–1.7)
Total Protein: 7.6 g/dL (ref 6.1–8.1)

## 2017-09-13 LAB — CBC WITH DIFFERENTIAL/PLATELET
BASOS PCT: 0.8 %
Basophils Absolute: 77 cells/uL (ref 0–200)
Eosinophils Absolute: 202 cells/uL (ref 15–500)
Eosinophils Relative: 2.1 %
HCT: 40.3 % (ref 35.0–45.0)
Hemoglobin: 13.6 g/dL (ref 11.7–15.5)
Lymphs Abs: 2026 cells/uL (ref 850–3900)
MCH: 30.4 pg (ref 27.0–33.0)
MCHC: 33.7 g/dL (ref 32.0–36.0)
MCV: 90 fL (ref 80.0–100.0)
MONOS PCT: 7.8 %
MPV: 10.4 fL (ref 7.5–12.5)
Neutro Abs: 6547 cells/uL (ref 1500–7800)
Neutrophils Relative %: 68.2 %
PLATELETS: 256 10*3/uL (ref 140–400)
RBC: 4.48 10*6/uL (ref 3.80–5.10)
RDW: 12.5 % (ref 11.0–15.0)
TOTAL LYMPHOCYTE: 21.1 %
WBC: 9.6 10*3/uL (ref 3.8–10.8)
WBCMIX: 749 {cells}/uL (ref 200–950)

## 2017-09-13 LAB — IFE INTERPRETATION: Immunofix Electr Int: NOT DETECTED

## 2017-09-23 ENCOUNTER — Telehealth: Payer: Self-pay

## 2017-09-23 NOTE — Telephone Encounter (Signed)
Patient inquiring about results for her lab work done on 09/10/17. Hasn't heard back about them yet.

## 2017-11-25 IMAGING — US US ABDOMEN COMPLETE
1 series · 13 of 25 positions shown · non-contrast
Comparison: 05/10/2011

CLINICAL DATA: Abnormal liver function tests. Right upper quadrant
abdominal pain for 2 weeks.

EXAM:
ABDOMEN ULTRASOUND COMPLETE

[Series 1: us abdomen complete · 0.17mm/px · 13 of 81 slices shown]
[im 1/81]
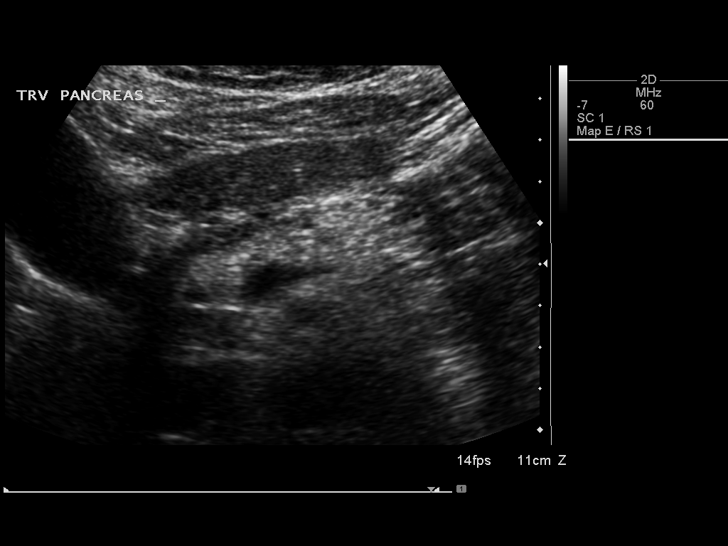
[im 7/81]
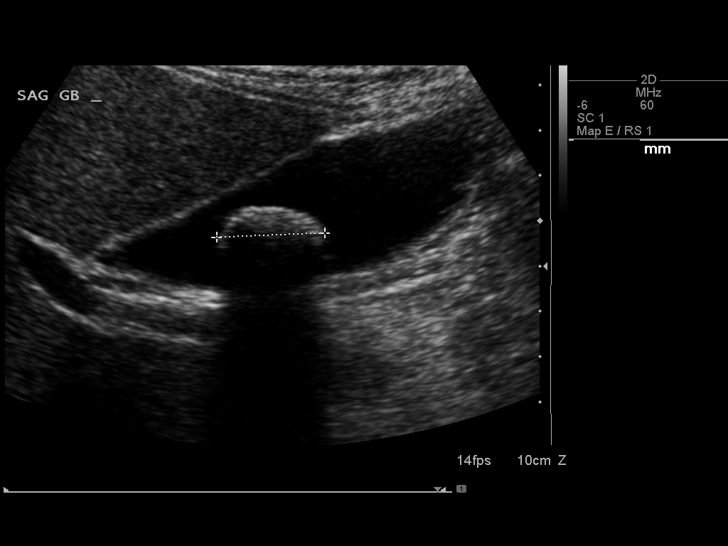
[im 14/81]
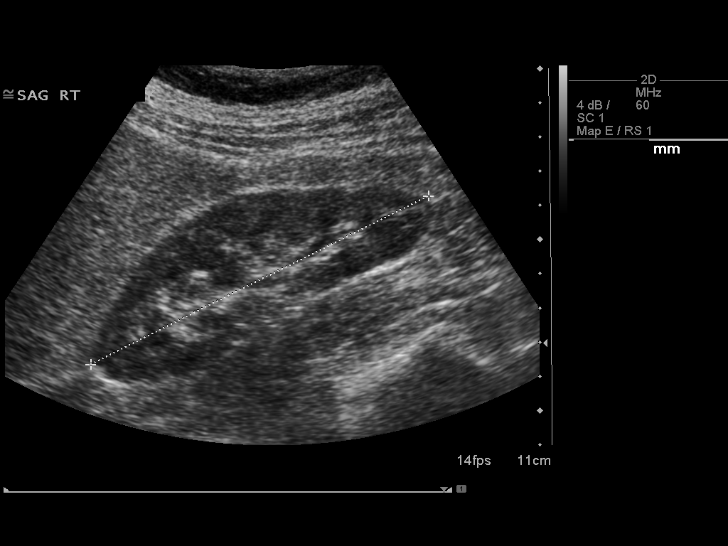
[im 21/81]
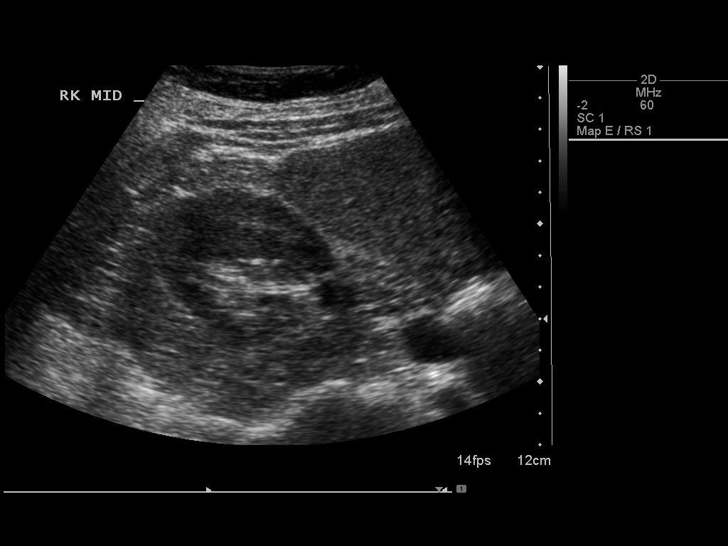
[im 27/81]
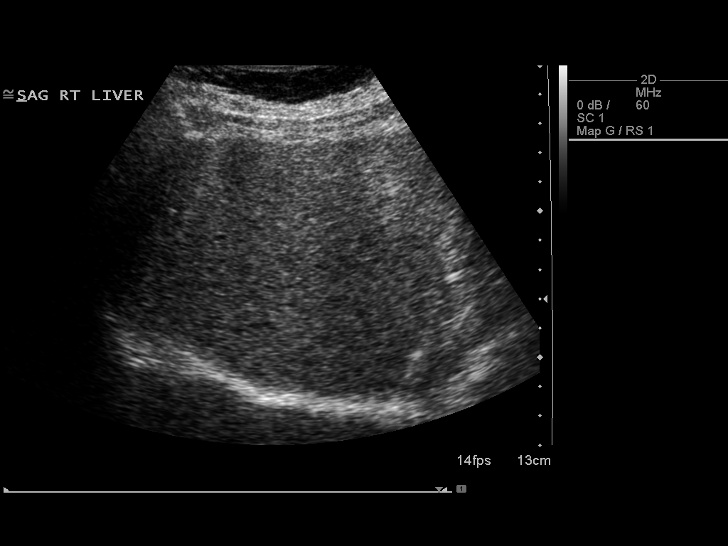
[im 34/81]
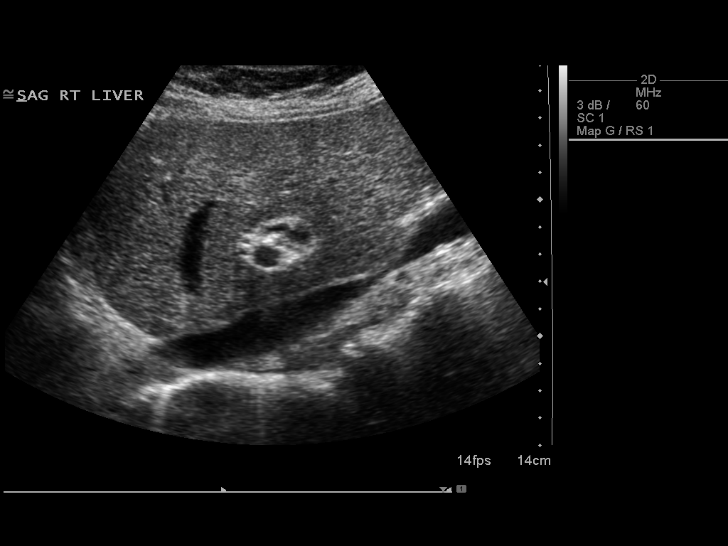
[im 41/81]
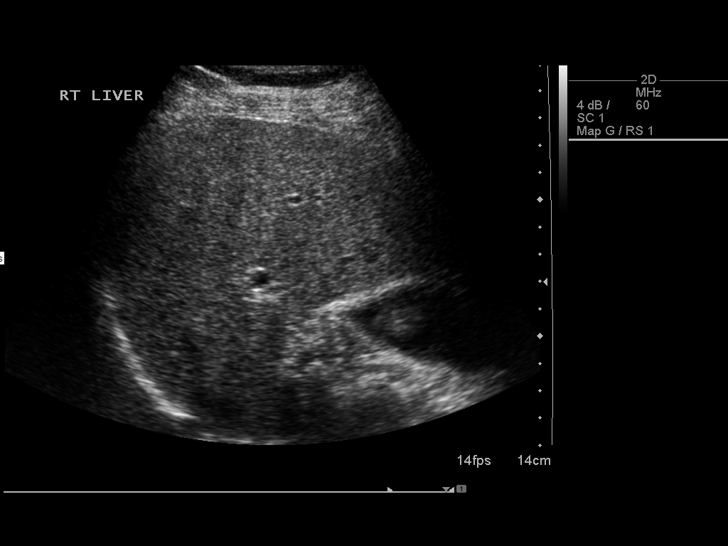
[im 47/81]
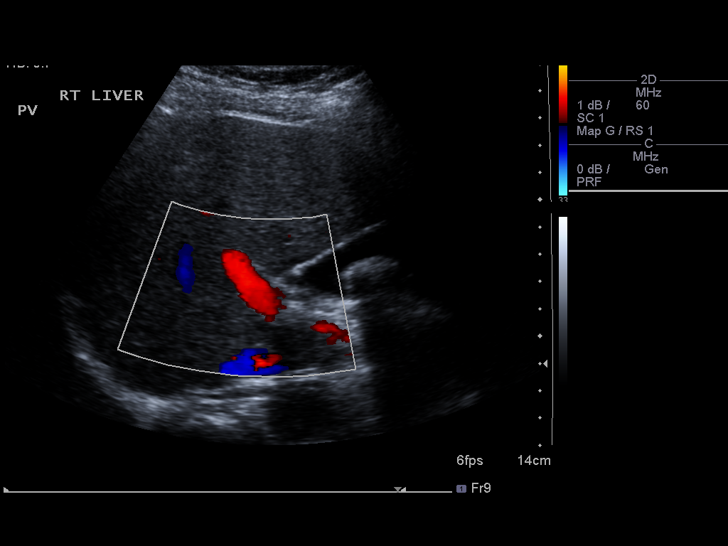
[im 54/81]
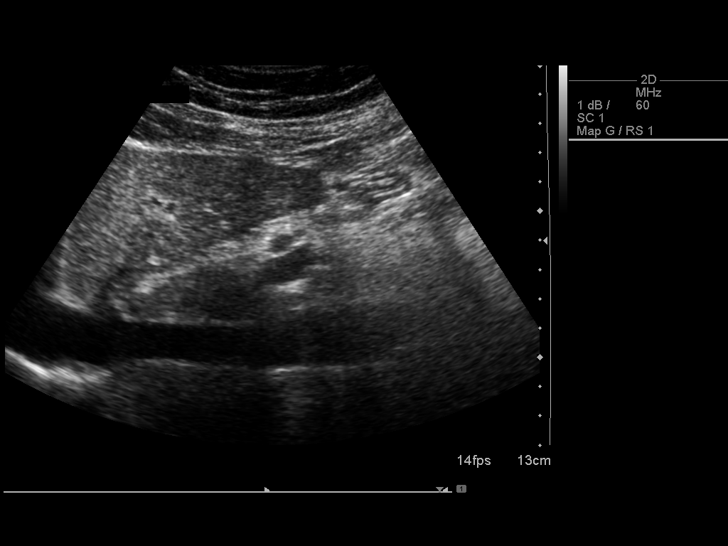
[im 61/81]
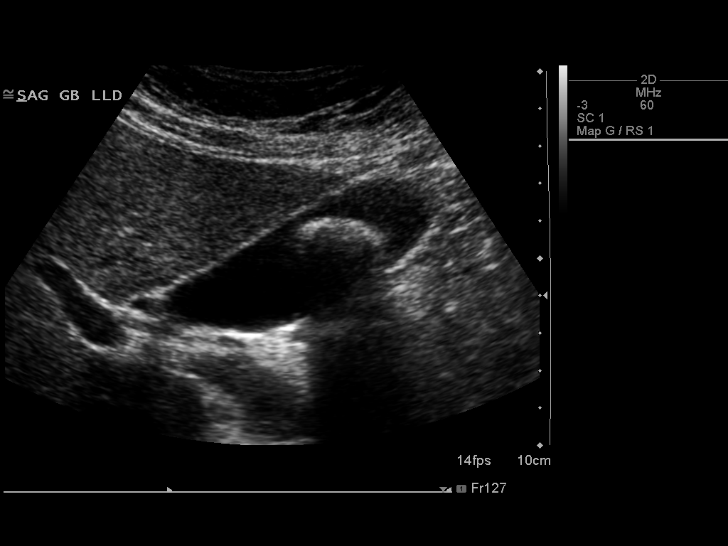
[im 67/81]
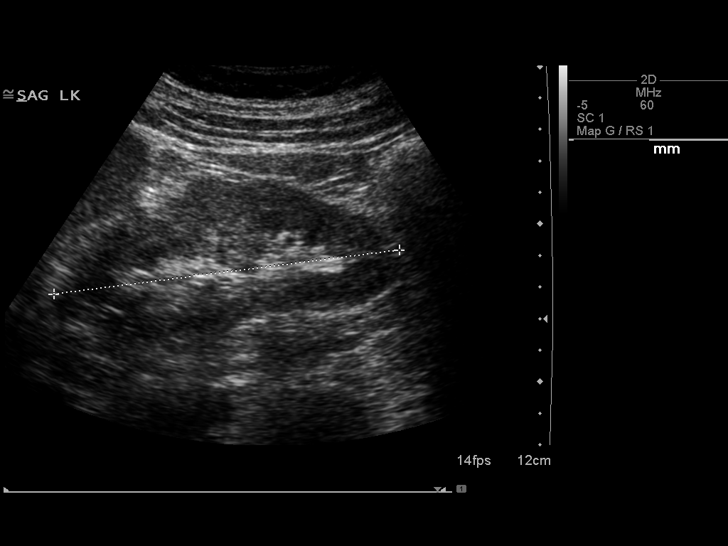
[im 74/81]
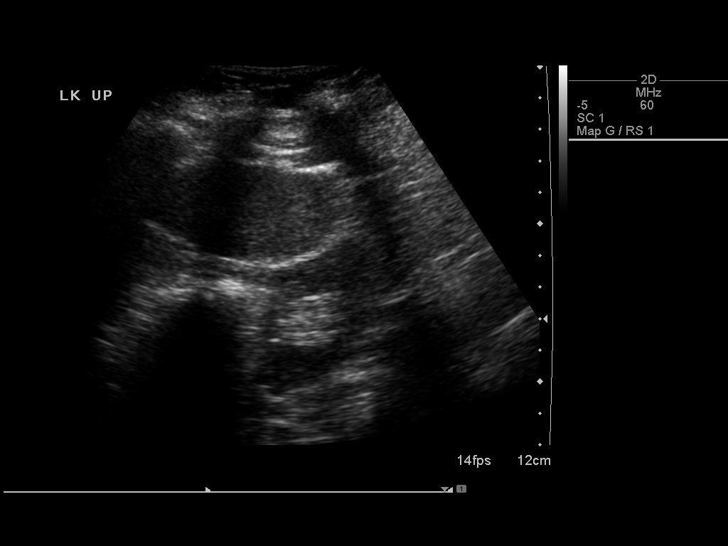
[im 81/81]
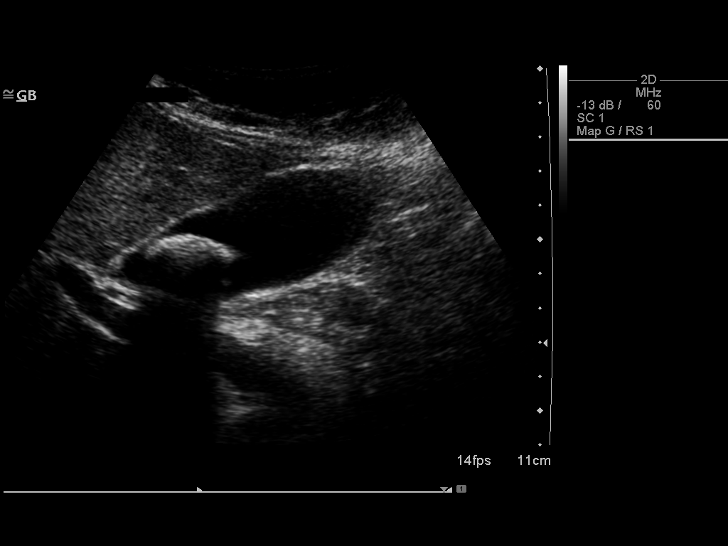

[13 of 25 positions shown; findings below may reference images not displayed]

FINDINGS: Gallbladder: 2.4 cm in length mobile gallstone in the gallbladder.
No gallbladder wall thickening or sonographic Murphy sign.

Common bile duct: Diameter: 3 mm

Liver: Inferiorly in the margin of the right hepatic lobe there is a
1.5 by 1.5 by 1.0 cm (volume = 1.2 cc) simple appearing cyst. Mildly
echogenic and heterogeneous, favoring diffuse hepatic steatosis.

IVC: No abnormality visualized.

Pancreas: Visualized portion unremarkable. Portions the pancreatic
head were not well seen due to overlying bowel gas.

Spleen: Size and appearance within normal limits.

Right Kidney: Length: 11.0 cm. Echogenicity within normal limits. No
mass or hydronephrosis visualized.

Left Kidney: Length: 11.0 cm. Echogenicity within normal limits. No
mass or hydronephrosis visualized.

Abdominal aorta: No aneurysm visualized.

Other findings: None.
IMPRESSION: 1. Coarse echogenic liver with poor sonic penetration compatible
with diffuse hepatic steatosis.
2. Small cyst inferiorly in the right hepatic lobe.
3. 2.4 cm in length mild well gallstone in the gallbladder, without
gallbladder wall thickening or sonographic Murphy's sign.

## 2018-07-09 NOTE — Progress Notes (Deleted)
WELLNESS  Assessment and Plan: Hypertension - continue medications, DASH diet, exercise and monitor at home. Call if greater than 130/80.  - CBC with Differential/Platelet - BASIC METABOLIC PANEL WITH GFR - Hepatic function panel - TSH - Urinalysis, Routine w reflex microscopic (not at [facility]) - Microalbumin / creatinine urine ratio   Hyperlipidemia -continue medications, check lipids, decrease fatty foods, increase activity.  - Lipid panel  Prediabetes Discussed general issues about diabetes pathophysiology and management., Educational material distributed., Suggested low cholesterol diet., Encouraged aerobic exercise., Discussed foot care., Reminded to get yearly retinal exam.   Vitamin D deficiency   GERD Continue PPI/H2 blocker, diet discussed  Medication management  Poor compliance with medical recommendations Declines vaccinations  Medicare wellness Get MGM, not willing to do colonoscopy, will do cologuard.   Fatty liver Weight loss advised, will monitor LFTs  Encounter for Medicare annual wellness exam 1 year  BMI 27.0-27.9,adult  Overweight  - long discussion about weight loss, diet, and exercise -recommended diet heavy in fruits and veggies and low in animal meats, cheeses, and dairy products  Screen for colon cancer -     Cologuard  Discussed med's effects and SE's. Screening labs and tests as requested with regular follow-up as recommended. Over 30 minutes of exam, counseling, chart review, and complex, high level critical decision making was performed this visit.  Future Appointments  Date Time Provider [place]  07/14/2018 11:15 AM Vicie Mutters, PA-C [facility] None    Medicare Attestation I have personally reviewed: The patient's medical and social history Their use of alcohol, tobacco or illicit drugs Their current medications and supplements The patient's functional ability including ADLs,fall risks, home safety risks, cognitive, and  hearing and visual impairment Diet and physical activities Evidence for depression or mood disorders  The patient's weight, height, BMI, and visual acuity have been recorded in the chart.  I have made referrals, counseling, and provided education to the patient based on review of the above and I have provided the patient with a written personalized care plan for preventive services.    HPI  73 y.o. female  presents for medicare wellness and follow up.   She has right lateral hip pain, down her lateral leg, to her lateral right ankle. She has no injury. She has seen Dr. Marlou Sa in the past. Worse with sitting, worse with lying down.   Her blood pressure has been controlled at home, she is controlling diet/exercise, today their BP is    She does workout. She denies chest pain, shortness of breath, dizziness.  She is not on cholesterol medication, she declines statins.  Her cholesterol is not at goal. The cholesterol last visit was:   Lab Results  Component Value Date   CHOL 222 (H) 07/03/2017   HDL 40 (L) 07/03/2017   LDLCALC 156 (H) 07/03/2017   TRIG 136 07/03/2017   CHOLHDL 5.6 (H) 07/03/2017   She has been working on diet and exercise for prediabetes, and denies paresthesia of the feet, polydipsia, polyuria and visual disturbances. Last A1C in the office was:  Lab Results  Component Value Date   HGBA1C 5.6 09/04/2016  Patient is on Vitamin D supplement.   Lab Results  Component Value Date   VD25OH 40 09/04/2016  Retired Pharmacist, hospital.  She has had elevated LFTs, negative copper Lab Results  Component Value Date   ALT 48 (H) 09/10/2017   AST 52 (H) 09/10/2017   ALKPHOS 71 09/04/2016   BILITOT 0.7 09/10/2017   Has had  pneumonia 3 times after procedures and would prefer to avoid colonoscopy.  BMI is There is no height or weight on file to calculate BMI., she is working on diet and exercise. Wt Readings from Last 3 Encounters:  09/10/17 161 lb 6.4 oz (73.2 kg)  07/03/17 159 lb 12.8 oz  (72.5 kg)  11/08/16 158 lb 3.2 oz (71.8 kg)    Current Medications:  Current Outpatient Medications on File Prior to Visit  Medication Sig Dispense Refill  . Acetaminophen (TYLENOL EXTRA STRENGTH PO) Take by mouth. PRN    . Cholecalciferol (VITAMIN D PO) Take 5,000 Units by mouth daily.     . fluocinonide cream (LIDEX) 5.61 % Apply 1 application topically 2 (two) times daily. Apply to hands.     No current facility-administered medications on file prior to visit.    Health Maintenance:   Immunization History  Administered Date(s) Administered  . Pneumococcal-Unspecified 03/05/1997  . Td 03/05/1998   Last colonoscopy: 2008 DUE for this and would like to do cologuard  Last mammogram: 07/2013 normal, DUE Last pap smear/pelvic exam: 2010, declines another DEXA: 6-7 years, normal, declines another Hep panel neg 2013 [place] AB 2013, + gallstone US neck 2013  Prior vaccinations: TD or Tdap: 2000, will check Influenza: declines Pneumococcal: 1999 Prevnar 13: declines Shingles/Zostavax: declines  Last Dental Exam: Dr. Gloriann Loan, June 2017 Last Eye Exam: Dr. Delman Cheadle, wears glasses, 01/2016 Patient Care Team: Unk Pinto, MD as PCP - General (Internal Medicine) Sharyne Peach, MD as Consulting Physician (Ophthalmology) Earley Favor (Dentistry) Lafayette Dragon, MD (Inactive) as Consulting Physician (Gastroenterology)  Medical History:  Past Medical History:  Diagnosis Date  . Essential tremor   . GERD (gastroesophageal reflux disease)   . Heart murmur   . High cholesterol   . Hyperlipidemia   . Hypertension    labile  . Labile hypertension   . Prediabetes    Allergies Allergies  Allergen Reactions  . Ampicillin Hives and Swelling  . Aspirin Itching  . Doxycycline Itching and Swelling  . Macrodantin [Nitrofurantoin Macrocrystal] Hives, Itching and Swelling  . Percocet [Oxycodone-Acetaminophen]   . Propulsid [Cisapride] Itching and Swelling  . Voltaren [Diclofenac]  Hives and Itching  . Codeine Palpitations  . Penicillins Swelling and Rash    SURGICAL HISTORY She  has a past surgical history that includes Back surgery. FAMILY HISTORY Her family history includes Heart attack in her father; Lymphoma in her mother. SOCIAL HISTORY She  reports that she has never smoked. She has never used smokeless tobacco. She reports that she does not drink alcohol or use drugs.  MEDICARE WELLNESS OBJECTIVES: Physical activity:   Cardiac risk factors:   Depression/mood screen:   Depression screen [facility] 2/9 07/03/2017  Decreased Interest 0  Down, Depressed, Hopeless 0  PHQ - 2 Score 0    ADLs:  No flowsheet data found.   Cognitive Testing  Alert? Yes  Normal Appearance?Yes  Oriented to person? Yes  Place? Yes   Time? Yes  Recall of three objects?  Yes  Can perform simple calculations? Yes  Displays appropriate judgment?Yes  Can read the correct time from a watch face?Yes  EOL planning:      Review of Systems: Review of Systems  Constitutional: Negative.   HENT: Negative.   Eyes: Negative.   Respiratory: Negative.  Negative for shortness of breath.   Cardiovascular: Negative.  Negative for chest pain.  Gastrointestinal: Negative.        + hemorrhoid  Genitourinary: Negative.  Musculoskeletal: Negative.  Negative for falls.  Skin: Negative.   Neurological: Negative.   Psychiatric/Behavioral: Negative.  Negative for depression.    Physical Exam: Estimated body mass index is 27.7 kg/m as calculated from the following:   Height as of 09/10/17: 5' 4"  (1.626 m).   Weight as of 09/10/17: 161 lb 6.4 oz (73.2 kg). There were no vitals taken for this visit. General Appearance: Well nourished, in no apparent distress.  Eyes: PERRLA, EOMs, conjunctiva no swelling or erythema, normal fundi and vessels.  Sinuses: No Frontal/maxillary tenderness  ENT/Mouth: Ext aud canals clear, normal light reflex with TMs without erythema, bulging. Good dentition. No  erythema, swelling, or exudate on post pharynx. Tonsils not swollen or erythematous. Hearing normal.  Neck: Supple, thyroid normal. No bruits  Respiratory: Respiratory effort normal, BS equal bilaterally without rales, rhonchi, wheezing or stridor.  Cardio: RRR without murmurs, rubs or gallops. Brisk peripheral pulses without edema.  Chest: symmetric, with normal excursions and percussion.  Breasts: Symmetric, without lumps, nipple discharge, retractions.  Abdomen: Soft, nontender, no guarding, rebound, hernias, masses, or organomegaly.  Lymphatics: Non tender without lymphadenopathy.  Genitourinary: defer Musculoskeletal: Full ROM all peripheral extremities,5/5 strength, and normal gait.  Skin: Warm, dry without rashes, lesions, ecchymosis. Neuro: Cranial nerves intact, reflexes equal bilaterally. Normal muscle tone, no cerebellar symptoms. Sensation intact.  Psych: Awake and oriented X 3, normal affect, Insight and Judgment appropriate.    Medicare Attestation I have personally reviewed: The patient's medical and social history Their use of alcohol, tobacco or illicit drugs Their current medications and supplements The patient's functional ability including ADLs,fall risks, home safety risks, cognitive, and hearing and visual impairment Diet and physical activities Evidence for depression or mood disorders  The patient's weight, height, BMI, and visual acuity have been recorded in the chart.  I have made referrals, counseling, and provided education to the patient based on review of the above and I have provided the patient with a written personalized care plan for preventive services.     Vicie Mutters 4:05 PM [facility]

## 2018-07-14 ENCOUNTER — Ambulatory Visit: Payer: Self-pay | Admitting: Physician Assistant

## 2018-07-14 DIAGNOSIS — Z0000 Encounter for general adult medical examination without abnormal findings: Principal | ICD-10-CM

## 2018-07-14 DIAGNOSIS — E782 Mixed hyperlipidemia: Secondary | ICD-10-CM

## 2018-07-14 DIAGNOSIS — Z79899 Other long term (current) drug therapy: Secondary | ICD-10-CM

## 2018-07-14 DIAGNOSIS — E559 Vitamin D deficiency, unspecified: Secondary | ICD-10-CM

## 2018-07-14 DIAGNOSIS — K760 Fatty (change of) liver, not elsewhere classified: Secondary | ICD-10-CM

## 2018-07-14 DIAGNOSIS — K210 Gastro-esophageal reflux disease with esophagitis: Secondary | ICD-10-CM

## 2018-07-14 DIAGNOSIS — R0989 Other specified symptoms and signs involving the circulatory and respiratory systems: Secondary | ICD-10-CM

## 2018-07-14 DIAGNOSIS — R7309 Other abnormal glucose: Secondary | ICD-10-CM

## 2018-07-14 DIAGNOSIS — Z9119 Patient's noncompliance with other medical treatment and regimen: Secondary | ICD-10-CM

## 2018-07-30 NOTE — Progress Notes (Signed)
WELLNESS  THIS ENCOUNTER IS A VIRTUAL/TELEPHONE VISIT DUE TO COVID-19 - PATIENT WAS NOT SEEN IN THE OFFICE.  PATIENT HAS CONSENTED TO VIRTUAL VISIT / TELEMEDICINE VISIT  This provider placed a call to Samuel Germany using telephone, her appointment was changed to a virtual office visit to reduce the risk of exposure to the COVID-19 virus and to help [employer] remain healthy and safe. The virtual visit will also provide continuity of care. She verbalizes understanding.    Assessment and Plan: Hypertension - continue medications, DASH diet, exercise and monitor at home. Call if greater than 130/80.    Hyperlipidemia -continue medications, check lipids, decrease fatty foods, increase activity.   Abnormal glucose Discussed general issues about diabetes pathophysiology and management., Educational material distributed., Suggested low cholesterol diet., Encouraged aerobic exercise., Discussed foot care., Reminded to get yearly retinal exam.   Vitamin D deficiency   GERD Continue PPI/H2 blocker, diet discussed  Medication management  Poor compliance with medical recommendations Declines vaccinations  Medicare wellness Get MGM, not willing to do colonoscopy, will do cologuard.   Fatty liver Weight loss advised, will monitor LFTs  Encounter for Medicare annual wellness exam 1 year  BMI 27.0-27.9,adult  Overweight  - long discussion about weight loss, diet, and exercise -recommended diet heavy in fruits and veggies and low in animal meats, cheeses, and dairy products  Discussed med's effects and SE's. Screening labs and tests as requested with regular follow-up as recommended. Over 30 minutes of exam, counseling, chart review, and complex, high level critical decision making was performed this visit.  No future appointments.  Medicare Attestation I have personally reviewed: The patient's medical and social history Their use of alcohol, tobacco or illicit drugs Their  current medications and supplements The patient's functional ability including ADLs,fall risks, home safety risks, cognitive, and hearing and visual impairment Diet and physical activities Evidence for depression or mood disorders  The patient's weight, height, BMI, and visual acuity have been recorded in the chart.  I have made referrals, counseling, and provided education to the patient based on review of the above and I have provided the patient with a written personalized care plan for preventive services.    HPI  73 y.o. female  presents for medicare wellness and follow up.   Going to go to [place] for her 53 year old great aunts birthday.   BMI is Body mass index is 27.46 kg/m., she states she was not eating best around easter, she has increased her water and is going to try to go back to working out.  Wt Readings from Last 3 Encounters:  08/01/18 160 lb (72.6 kg)  09/10/17 161 lb 6.4 oz (73.2 kg)  07/03/17 159 lb 12.8 oz (72.5 kg)   Her blood pressure has been controlled at home, she is controlling diet/exercise, today their BP is BP: 132/70   She does workout. She denies chest pain, shortness of breath, dizziness.  She is not on cholesterol medication, she declines statins.  Her cholesterol is not at goal. The cholesterol last visit was:   Lab Results  Component Value Date   CHOL 222 (H) 07/03/2017   HDL 40 (L) 07/03/2017   LDLCALC 156 (H) 07/03/2017   TRIG 136 07/03/2017   CHOLHDL 5.6 (H) 07/03/2017   She has been working on diet and exercise for prediabetes, and denies paresthesia of the feet, polydipsia, polyuria and visual disturbances. Last A1C in the office was:  Lab Results  Component Value Date  HGBA1C 5.6 09/04/2016   Patient is on Vitamin D supplement.   Lab Results  Component Value Date   VD25OH 40 09/04/2016   Retired Pharmacist, hospital.  She has had elevated LFTs, negative copper Lab Results  Component Value Date   ALT 48 (H) 09/10/2017   AST 52 (H)  09/10/2017   ALKPHOS 71 09/04/2016   BILITOT 0.7 09/10/2017   Has had pneumonia 3 times after procedures and would prefer to avoid colonoscopy.   Current Medications:  Current Outpatient Medications on File Prior to Visit  Medication Sig Dispense Refill  . Acetaminophen (TYLENOL EXTRA STRENGTH PO) Take by mouth. PRN    . Cholecalciferol (VITAMIN D PO) Take 5,000 Units by mouth daily.     . fluocinonide cream (LIDEX) 5.70 % Apply 1 application topically 2 (two) times daily. Apply to hands.     No current facility-administered medications on file prior to visit.    Health Maintenance:   Immunization History  Administered Date(s) Administered  . Pneumococcal-Unspecified 03/05/1997  . Td 03/05/1998   Last colonoscopy: 2008 DUE for this and would like to do cologuard  Last mammogram: 07/2013 normal, DUE will call for an appointment Last pap smear/pelvic exam: 2010, declines another DEXA: 6-7 years, normal, declines another Hep panel neg 2013 [place] AB 2017, + gallstone, no thickening, + fatty liver US neck 2013  Prior vaccinations: TD or Tdap: 2000, DUE Influenza: declines Pneumococcal: 1999 Prevnar 13: declines Shingles/Zostavax: declines  Last Dental Exam: Dr. Gloriann Loan, 2020, had extraction and is going to have another Last Eye Exam: Dr. Delman Cheadle, wears glasses, 2020 Patient Care Team: Unk Pinto, MD as PCP - General (Internal Medicine) Sharyne Peach, MD as Consulting Physician (Ophthalmology) Earley Favor (Dentistry) Lafayette Dragon, MD (Inactive) as Consulting Physician (Gastroenterology)  Medical History:  Past Medical History:  Diagnosis Date  . Essential tremor   . GERD (gastroesophageal reflux disease)   . Heart murmur   . High cholesterol   . Hyperlipidemia   . Hypertension    labile  . Labile hypertension   . Prediabetes    Allergies Allergies  Allergen Reactions  . Ampicillin Hives and Swelling  . Aspirin Itching  . Doxycycline Itching and Swelling   . Macrodantin [Nitrofurantoin Macrocrystal] Hives, Itching and Swelling  . Percocet [Oxycodone-Acetaminophen]   . Propulsid [Cisapride] Itching and Swelling  . Voltaren [Diclofenac] Hives and Itching  . Codeine Palpitations  . Penicillins Swelling and Rash    SURGICAL HISTORY She  has a past surgical history that includes Back surgery. FAMILY HISTORY Her family history includes Heart attack in her father; Lymphoma in her mother. SOCIAL HISTORY She  reports that she has never smoked. She has never used smokeless tobacco. She reports that she does not drink alcohol or use drugs.  MEDICARE WELLNESS OBJECTIVES: Physical activity: Current Exercise Habits: The patient does not participate in regular exercise at present(getting back into walking, has not started yet) Cardiac risk factors: Cardiac Risk Factors include: advanced age (>42mn, >>49women);dyslipidemia;hypertension;sedentary lifestyle Depression/mood screen:   [facility]  Decreased Interest 0  Down, Depressed, Hopeless 0  PHQ - 2 Score 0    ADLs:  In your present state of health, do you have any difficulty performing the following activities: 08/01/2018  Hearing? N  Vision? N  Difficulty concentrating or making decisions? N  Walking or climbing stairs? N  Dressing or bathing? N  Doing errands, shopping? N  Some recent data might be hidden  Cognitive Testing  Alert? Yes  Normal Appearance?Yes  Oriented to person? Yes  Place? Yes   Time? Yes  Recall of three objects?  Yes  Can perform simple calculations? Yes  Displays appropriate judgment?Yes  Can read the correct time from a watch face?Yes  EOL planning: Does Patient Have a Medical Advance Directive?: Yes Type of Advance Directive: Healthcare Power of Attorney, Living will Does patient want to make changes to medical advance directive?: No - Patient declined  Review of Systems: Review of Systems  Constitutional: Negative.   HENT:  Negative.   Eyes: Negative.   Respiratory: Negative.  Negative for shortness of breath.   Cardiovascular: Negative.  Negative for chest pain.  Gastrointestinal: Negative.        + hemorrhoid  Genitourinary: Negative.   Musculoskeletal: Negative.  Negative for falls.  Skin: Negative.   Neurological: Negative.   Psychiatric/Behavioral: Negative.  Negative for depression.    Physical Exam: Estimated body mass index is 27.46 kg/m as calculated from the following:   Height as of this encounter: 5' 4"  (1.626 m).   Weight as of this encounter: 160 lb (72.6 kg). BP 132/70   Ht 5' 4"  (1.626 m)   Wt 160 lb (72.6 kg)   BMI 27.46 kg/m  General Appearance:Well sounding, in no apparent distress.  ENT/Mouth: No hoarseness, No cough for duration of visit.  Respiratory: completing full sentences without distress, without audible wheeze Neuro: Awake and oriented X 3,  Psych:  Insight and Judgment appropriate.    Medicare Attestation I have personally reviewed: The patient's medical and social history Their use of alcohol, tobacco or illicit drugs Their current medications and supplements The patient's functional ability including ADLs,fall risks, home safety risks, cognitive, and hearing and visual impairment Diet and physical activities Evidence for depression or mood disorders  The patient's weight, height, BMI, and visual acuity have been recorded in the chart.  I have made referrals, counseling, and provided education to the patient based on review of the above and I have provided the patient with a written personalized care plan for preventive services.     Vicie Mutters 11:18 AM [facility]

## 2018-08-01 ENCOUNTER — Other Ambulatory Visit: Payer: Self-pay

## 2018-08-01 ENCOUNTER — Encounter: Payer: Self-pay | Admitting: Physician Assistant

## 2018-08-01 ENCOUNTER — Ambulatory Visit: Payer: Medicare Other | Admitting: Physician Assistant

## 2018-08-01 VITALS — BP 132/70 | Ht 64.0 in | Wt 160.0 lb

## 2018-08-01 DIAGNOSIS — E559 Vitamin D deficiency, unspecified: Secondary | ICD-10-CM

## 2018-08-01 DIAGNOSIS — K2100 Gastro-esophageal reflux disease with esophagitis, without bleeding: Secondary | ICD-10-CM

## 2018-08-01 DIAGNOSIS — Z0001 Encounter for general adult medical examination with abnormal findings: Secondary | ICD-10-CM

## 2018-08-01 DIAGNOSIS — R0989 Other specified symptoms and signs involving the circulatory and respiratory systems: Secondary | ICD-10-CM

## 2018-08-01 DIAGNOSIS — R6889 Other general symptoms and signs: Secondary | ICD-10-CM | POA: Diagnosis not present

## 2018-08-01 DIAGNOSIS — Z91199 Patient's noncompliance with other medical treatment and regimen due to unspecified reason: Secondary | ICD-10-CM

## 2018-08-01 DIAGNOSIS — R7309 Other abnormal glucose: Secondary | ICD-10-CM

## 2018-08-01 DIAGNOSIS — Z0000 Encounter for general adult medical examination without abnormal findings: Secondary | ICD-10-CM

## 2018-08-01 DIAGNOSIS — Z79899 Other long term (current) drug therapy: Secondary | ICD-10-CM

## 2018-08-01 DIAGNOSIS — K760 Fatty (change of) liver, not elsewhere classified: Secondary | ICD-10-CM | POA: Diagnosis not present

## 2018-08-01 DIAGNOSIS — E782 Mixed hyperlipidemia: Secondary | ICD-10-CM | POA: Diagnosis not present

## 2018-08-01 DIAGNOSIS — Z9119 Patient's noncompliance with other medical treatment and regimen: Secondary | ICD-10-CM

## 2019-02-05 NOTE — Progress Notes (Signed)
COMPLETE PHYSICAL Assessment and Plan:  B12 deficiency -     Vitamin B12  Iron deficiency -     Iron,Total/Total Iron Binding Cap -     Zinc  Hair loss -     Iron,Total/Total Iron Binding Cap -     Vitamin B12 -     Zinc -? From tooth infection/stress versus pattern hair loss in family - rule out def, if + def high suggest GI referral.  Abdominal bloating and RUQ pain -     US Abdomen Complete  AB pain; Future - continue better diet, get imaging rule out inflammation at gallbladder/NASH into cirrhosis, may need pelvic US if not better but bloating mainly upper AB  Hypertension -  DASH diet, exercise and monitor at home. Call if greater than 130/80.    Hyperlipidemia -not on any medications, check lipids, decrease fatty foods, increase activity.   Abnormal glucose Discussed disease progression and risks Discussed diet/exercise, weight management and risk modification   Vitamin D deficiency Continue supplement, has just restarted back   GERD Continue diet, may need H2/PPI if symptoms continue/come back  Medication management  Poor compliance with medical recommendations Prefers lifestyle approach and will do research on health topics  Fatty liver Weight loss advised, will monitor LFTs Last [place] 2017- may need repeat with symptoms  CPE 1 year Get MGM, suggest some form of colon cancer screening, something is better than nothing. EKG next year, no symptoms  BMI 27.0-27.9,adult  Overweight  - long discussion about weight loss, diet, and exercise -recommended diet heavy in fruits and veggies and low in animal meats, cheeses, and dairy products  Discussed med's effects and SE's. Screening labs and tests as requested with regular follow-up as recommended. Over 30 minutes of exam, counseling, chart review, and complex, high level critical decision making was performed this visit.  Future Appointments  Date Time Provider [place]  08/17/2019 11:15 AM Vicie Mutters, PA-C [facility] None  02/09/2020  3:00 PM Vicie Mutters, PA-C [facility] None    HPI  73 y.o. female  presents for CPE and follow up.   She has had more hair loss over last 1.5 years, she has had 2 falls in last 2 years and she has had tooth infected with stress still on left side, following with dentist, has some family history of hair loss but usually later. Has had some symptoms of hypothyroidism like constipation, dry hair/nails, but has been actively working to lose weight.   BMI is Body mass index is 26.95 kg/m. States she feels her AB has been bloated/keeping fat around her midsection, GERD symptoms, feels it is mainly upper AB, she has had some RUQ discomfort. However she has cut back on her comfort foods, increased water and she is feeling better.  [place] AB 2017, + gallstone, no thickening, + fatty liver Wt Readings from Last 3 Encounters:  02/09/19 157 lb (71.2 kg)  08/01/18 160 lb (72.6 kg)  09/10/17 161 lb 6.4 oz (73.2 kg)   Her blood pressure has been controlled at home, she is controlling diet/exercise, today their BP is BP: (!) 142/80   She does workout. She denies chest pain, shortness of breath, dizziness.  She is not on cholesterol medication, she declines statins.  Her cholesterol is not at goal. The cholesterol last visit was:   Lab Results  Component Value Date   CHOL 222 (H) 07/03/2017   HDL 40 (L) 07/03/2017   LDLCALC 156 (H) 07/03/2017   TRIG  136 07/03/2017   CHOLHDL 5.6 (H) 07/03/2017   She has been working on diet and exercise for prediabetes, and denies paresthesia of the feet, polydipsia, polyuria and visual disturbances. Last A1C in the office was:  Lab Results  Component Value Date   HGBA1C 5.6 09/04/2016   Patient is on Vitamin D supplement, started back 5000 IU a day.   Lab Results  Component Value Date   VD25OH 40 09/04/2016   Retired Pharmacist, hospital.  She has had elevated LFTs.  Lab Results  Component Value Date   ALT 48 (H) 09/10/2017    AST 52 (H) 09/10/2017   ALKPHOS 71 09/04/2016   BILITOT 0.7 09/10/2017   Has had pneumonia 3 times after procedures and would prefer to avoid colonoscopy.   Current Medications:  No current outpatient medications on file prior to visit.   No current facility-administered medications on file prior to visit.    Health Maintenance:   Immunization History  Administered Date(s) Administered  . Pneumococcal-Unspecified 03/05/1997  . Td 03/05/1998   Last colonoscopy: 2008 DUE for this however she has declined and has not wanted to do cologuard at this time, and looking into colonoscopy possibility-  Last mammogram: 07/2013 normal, DUE will call for an appointment Last pap smear/pelvic exam: 2010, declines another DEXA: 6-7 years, normal, declines another Hep panel neg 2013 [place] AB 2017, + gallstone, no thickening, + fatty liver US neck 2013 CXR 04/2008  Prior vaccinations: TD or Tdap: 2000, DUE Influenza: declines Pneumococcal: 1999 Prevnar 13: declines Shingles/Zostavax: declines  Last Dental Exam: Dr. Gloriann Loan, 2020, had extraction and is going to have another Last Eye Exam: Dr. Delman Cheadle, wears glasses, 2020 Patient Care Team: Unk Pinto, MD as PCP - General (Internal Medicine) Sharyne Peach, MD as Consulting Physician (Ophthalmology) Earley Favor (Dentistry) Lafayette Dragon, MD (Inactive) as Consulting Physician (Gastroenterology)  Medical History:  Past Medical History:  Diagnosis Date  . Essential tremor   . GERD (gastroesophageal reflux disease)   . Heart murmur   . High cholesterol   . Hyperlipidemia   . Hypertension    labile  . Labile hypertension   . Prediabetes    Allergies Allergies  Allergen Reactions  . Ampicillin Hives and Swelling  . Aspirin Itching  . Doxycycline Itching and Swelling  . Macrodantin [Nitrofurantoin Macrocrystal] Hives, Itching and Swelling  . Percocet [Oxycodone-Acetaminophen]   . Propulsid [Cisapride] Itching and Swelling  .  Voltaren [Diclofenac] Hives and Itching  . Codeine Palpitations  . Penicillins Swelling and Rash    SURGICAL HISTORY She  has a past surgical history that includes Back surgery. FAMILY HISTORY Her family history includes Heart attack in her father; Lymphoma in her mother. SOCIAL HISTORY She  reports that she has never smoked. She has never used smokeless tobacco. She reports that she does not drink alcohol or use drugs.   Review of Systems: Review of Systems  Constitutional: Negative.   HENT: Negative.   Eyes: Negative.   Respiratory: Negative.  Negative for shortness of breath.   Cardiovascular: Negative.  Negative for chest pain.  Gastrointestinal: Positive for constipation and heartburn. Negative for abdominal pain, blood in stool, diarrhea, melena, nausea and vomiting.       + hemorrhoid  Genitourinary: Negative.  Negative for dysuria, flank pain, frequency, hematuria and urgency.  Musculoskeletal: Negative.  Negative for falls.  Skin: Negative.   Neurological: Negative.   Psychiatric/Behavioral: Negative.  Negative for depression.    Physical Exam: Estimated body mass index is  26.95 kg/m as calculated from the following:   Height as of this encounter: 5' 4"  (1.626 m).   Weight as of this encounter: 157 lb (71.2 kg). BP (!) 142/80   Pulse 81   Temp 97.9 F (36.6 C)   Ht 5' 4"  (1.626 m)   Wt 157 lb (71.2 kg)   SpO2 96%   BMI 26.95 kg/m  General Appearance: Well nourished, in no apparent distress.  Eyes: PERRLA, EOMs, conjunctiva no swelling or erythema, normal fundi and vessels.  Sinuses: No Frontal/maxillary tenderness  ENT/Mouth: Ext aud canals clear, normal light reflex with TMs without erythema, bulging. Good dentition. No erythema, swelling, or exudate on post pharynx. Tonsils not swollen or erythematous. Hearing normal.  Neck: Supple, thyroid normal. No bruits  Respiratory: Respiratory effort normal, BS equal bilaterally without rales, rhonchi, wheezing or  stridor.  Cardio: RRR without murmurs, rubs or gallops. Brisk peripheral pulses without edema.  Chest: symmetric, with normal excursions and percussion.  Breasts: Symmetric, without lumps, nipple discharge, retractions.  Abdomen: Soft, slight upper AB distention however nontender, no guarding, rebound, hernias, masses, or organomegaly.  Lymphatics: Non tender without lymphadenopathy.  Genitourinary: defer Musculoskeletal: Full ROM all peripheral extremities,5/5 strength, and normal gait.  Skin: Warm, dry without rashes, lesions, ecchymosis. Neuro: Cranial nerves intact, reflexes equal bilaterally. Normal muscle tone, no cerebellar symptoms. Sensation intact.  Psych: Awake and oriented X 3, normal affect, Insight and Judgment appropriate.   EKG defer Vicie Mutters 11:36 AM [facility]

## 2019-02-09 ENCOUNTER — Other Ambulatory Visit: Payer: Self-pay

## 2019-02-09 ENCOUNTER — Encounter: Payer: Self-pay | Admitting: Physician Assistant

## 2019-02-09 ENCOUNTER — Ambulatory Visit: Payer: Medicare Other | Admitting: Physician Assistant

## 2019-02-09 VITALS — BP 142/80 | HR 81 | Temp 97.9°F | Ht 64.0 in | Wt 157.0 lb

## 2019-02-09 DIAGNOSIS — Z0000 Encounter for general adult medical examination without abnormal findings: Secondary | ICD-10-CM

## 2019-02-09 DIAGNOSIS — K2100 Gastro-esophageal reflux disease with esophagitis, without bleeding: Secondary | ICD-10-CM

## 2019-02-09 DIAGNOSIS — R0989 Other specified symptoms and signs involving the circulatory and respiratory systems: Secondary | ICD-10-CM

## 2019-02-09 DIAGNOSIS — I10 Essential (primary) hypertension: Secondary | ICD-10-CM

## 2019-02-09 DIAGNOSIS — Z9119 Patient's noncompliance with other medical treatment and regimen: Secondary | ICD-10-CM

## 2019-02-09 DIAGNOSIS — E559 Vitamin D deficiency, unspecified: Secondary | ICD-10-CM

## 2019-02-09 DIAGNOSIS — R140 Abdominal distension (gaseous): Secondary | ICD-10-CM

## 2019-02-09 DIAGNOSIS — Z0001 Encounter for general adult medical examination with abnormal findings: Secondary | ICD-10-CM

## 2019-02-09 DIAGNOSIS — E611 Iron deficiency: Secondary | ICD-10-CM

## 2019-02-09 DIAGNOSIS — R779 Abnormality of plasma protein, unspecified: Secondary | ICD-10-CM

## 2019-02-09 DIAGNOSIS — Z91199 Patient's noncompliance with other medical treatment and regimen due to unspecified reason: Secondary | ICD-10-CM

## 2019-02-09 DIAGNOSIS — E782 Mixed hyperlipidemia: Secondary | ICD-10-CM

## 2019-02-09 DIAGNOSIS — Z130 Encounter for screening for diseases of the blood and blood-forming organs and certain disorders involving the immune mechanism: Secondary | ICD-10-CM

## 2019-02-09 DIAGNOSIS — K760 Fatty (change of) liver, not elsewhere classified: Secondary | ICD-10-CM

## 2019-02-09 DIAGNOSIS — L659 Nonscarring hair loss, unspecified: Secondary | ICD-10-CM

## 2019-02-09 DIAGNOSIS — R7309 Other abnormal glucose: Secondary | ICD-10-CM

## 2019-02-09 DIAGNOSIS — Z79899 Other long term (current) drug therapy: Secondary | ICD-10-CM

## 2019-02-09 DIAGNOSIS — Z136 Encounter for screening for cardiovascular disorders: Secondary | ICD-10-CM

## 2019-02-09 DIAGNOSIS — E538 Deficiency of other specified B group vitamins: Secondary | ICD-10-CM

## 2019-02-09 NOTE — Patient Instructions (Addendum)
HOW TO SCHEDULE A MAMMOGRAM  [facility] Schedule an appointment by calling [phone].   Colon cancer is 3rd most diagnosed cancer and 2nd leading cause of death in both men and women 73 years of age and older despite being one of the most preventable and treatable cancers if found early.  4 of out 5 people diagnosed with colon cancer have NO prior family history.  When caught EARLY 90% of colon cancer is curable.  YOU CAN CALL TO MAKE AN ULTRASOUND..  I have put in an order for an ultrasound for you to have You can set them up at your convenience by calling this number [phone] You will likely have the ultrasound at [address] [facility]  If you have any issues call our office and we will set this up for you.    Abdominal Bloating When you have abdominal bloating, your abdomen may feel full, tight, or painful. It may also look bigger than normal or swollen (distended). Common causes of abdominal bloating include:  Swallowing air.  Constipation.  Problems digesting food.  Eating too much.  Irritable bowel syndrome. This is a condition that affects the large intestine.  Lactose intolerance. This is an inability to digest lactose, a natural sugar in dairy products.  Celiac disease. This is a condition that affects the ability to digest gluten, a protein found in some grains.  Gastroparesis. This is a condition that slows down the movement of food in the stomach and small intestine. It is more common in people with diabetes mellitus.  Gastroesophageal reflux disease (GERD). This is a digestive condition that makes stomach acid flow back into the esophagus.  Urinary retention. This means that the body is holding onto urine, and the bladder cannot be emptied all the way. Follow these instructions at home: Eating and drinking  Avoid eating too much.  Try not to swallow air while talking or eating.  Avoid eating while lying down.  Avoid these foods and  drinks: ? Foods that cause gas, such as broccoli, cabbage, cauliflower, and baked beans. ? Carbonated drinks. ? Hard candy. ? Chewing gum. Medicines  Take over-the-counter and prescription medicines only as told by your health care provider.  Take probiotic medicines. These medicines contain live bacteria or yeasts that can help digestion.  Take coated peppermint oil capsules. Activity  Try to exercise regularly. Exercise may help to relieve bloating that is caused by gas and relieve constipation. General instructions  Keep all follow-up visits as told by your health care provider. This is important. Contact a health care provider if:  You have nausea and vomiting.  You have diarrhea.  You have abdominal pain.  You have unusual weight loss or weight gain.  You have severe pain, and medicines do not help. Get help right away if:  You have severe chest pain.  You have trouble breathing.  You have shortness of breath.  You have trouble urinating.  You have darker urine than normal.  You have blood in your stools or have dark, tarry stools. Summary  Abdominal bloating means that the abdomen is swollen.  Common causes of abdominal bloating are swallowing air, constipation, and problems digesting food.  Avoid eating too much and avoid swallowing air.  Avoid foods that cause gas, carbonated drinks, hard candy, and chewing gum. This information is not intended to replace advice given to you by your health care provider. Make sure you discuss any questions you have with your health care  provider. Document Released: 03/23/2016 Document Revised: 06/09/2018 Document Reviewed: 03/23/2016 Elsevier Patient Education  2020 [employer].

## 2019-02-10 NOTE — Addendum Note (Signed)
Addended by: Vladimir Crofts on: 02/10/2019 08:37 AM   Modules accepted: Orders

## 2019-02-11 LAB — IRON, TOTAL/TOTAL IRON BINDING CAP
%SAT: 21 % (calc) (ref 16–45)
Iron: 82 ug/dL (ref 45–160)
TIBC: 388 mcg/dL (calc) (ref 250–450)

## 2019-02-11 LAB — HEMOGLOBIN A1C
Hgb A1c MFr Bld: 5.7 % of total Hgb — ABNORMAL HIGH (ref <5.7)
Mean Plasma Glucose: 117 (calc)
eAG (mmol/L): 6.5 (calc)

## 2019-02-11 LAB — COMPLETE METABOLIC PANEL WITH GFR
AG Ratio: 1.4 (calc) (ref 1.0–2.5)
ALT: 47 U/L — ABNORMAL HIGH (ref 6–29)
AST: 61 U/L — ABNORMAL HIGH (ref 10–35)
Albumin: 4.9 g/dL (ref 3.6–5.1)
Alkaline phosphatase (APISO): 81 U/L (ref 37–153)
BUN: 15 mg/dL (ref 7–25)
CO2: 27 mmol/L (ref 20–32)
Calcium: 10 mg/dL (ref 8.6–10.4)
Chloride: 102 mmol/L (ref 98–110)
Creat: 0.74 mg/dL (ref 0.60–0.93)
GFR, Est African American: 93 mL/min/{1.73_m2} (ref >=60)
GFR, Est Non African American: 80 mL/min/{1.73_m2} (ref >=60)
Globulin: 3.4 g/dL (calc) (ref 1.9–3.7)
Glucose, Bld: 95 mg/dL (ref 65–99)
Potassium: 4.4 mmol/L (ref 3.5–5.3)
Sodium: 139 mmol/L (ref 135–146)
Total Bilirubin: 0.5 mg/dL (ref 0.2–1.2)
Total Protein: 8.3 g/dL — ABNORMAL HIGH (ref 6.1–8.1)

## 2019-02-11 LAB — CBC WITH DIFFERENTIAL/PLATELET
Absolute Monocytes: 656 cells/uL (ref 200–950)
Basophils Absolute: 67 cells/uL (ref 0–200)
Basophils Relative: 0.7 %
Eosinophils Absolute: 181 cells/uL (ref 15–500)
Eosinophils Relative: 1.9 %
HCT: 43.3 % (ref 35.0–45.0)
Hemoglobin: 14.8 g/dL (ref 11.7–15.5)
Lymphs Abs: 2214 cells/uL (ref 850–3900)
MCH: 31.2 pg (ref 27.0–33.0)
MCHC: 34.2 g/dL (ref 32.0–36.0)
MCV: 91.2 fL (ref 80.0–100.0)
MPV: 10.9 fL (ref 7.5–12.5)
Monocytes Relative: 6.9 %
Neutro Abs: 6384 cells/uL (ref 1500–7800)
Neutrophils Relative %: 67.2 %
Platelets: 232 10*3/uL (ref 140–400)
RBC: 4.75 10*6/uL (ref 3.80–5.10)
RDW: 12.4 % (ref 11.0–15.0)
Total Lymphocyte: 23.3 %
WBC: 9.5 10*3/uL (ref 3.8–10.8)

## 2019-02-11 LAB — URINALYSIS, ROUTINE W REFLEX MICROSCOPIC
Bilirubin Urine: NEGATIVE
Glucose, UA: NEGATIVE
Hgb urine dipstick: NEGATIVE
Ketones, ur: NEGATIVE
Leukocytes,Ua: NEGATIVE
Nitrite: NEGATIVE
Protein, ur: NEGATIVE
Specific Gravity, Urine: 1.013 (ref 1.001–1.03)
pH: <=5 (ref 5.0–8.0)

## 2019-02-11 LAB — LIPID PANEL
Cholesterol: 229 mg/dL — ABNORMAL HIGH (ref <200)
HDL: 40 mg/dL — ABNORMAL LOW (ref >=50)
LDL Cholesterol (Calc): 159 mg/dL (calc) — ABNORMAL HIGH
Non-HDL Cholesterol (Calc): 189 mg/dL (calc) — ABNORMAL HIGH (ref <130)
Total CHOL/HDL Ratio: 5.7 (calc) — ABNORMAL HIGH (ref <5.0)
Triglycerides: 161 mg/dL — ABNORMAL HIGH (ref <150)

## 2019-02-11 LAB — ZINC: Zinc: 77 ug/dL (ref 60–130)

## 2019-02-11 LAB — VITAMIN B12: Vitamin B-12: 462 pg/mL (ref 200–1100)

## 2019-02-11 LAB — MICROALBUMIN / CREATININE URINE RATIO
Creatinine, Urine: 81 mg/dL (ref 20–275)
Microalb Creat Ratio: 26 mcg/mg creat (ref <30)
Microalb, Ur: 2.1 mg/dL

## 2019-02-11 LAB — MAGNESIUM: Magnesium: 2.1 mg/dL (ref 1.5–2.5)

## 2019-02-11 LAB — TSH: TSH: 2.43 mIU/L (ref 0.40–4.50)

## 2019-02-11 LAB — VITAMIN D 25 HYDROXY (VIT D DEFICIENCY, FRACTURES): Vit D, 25-Hydroxy: 32 ng/mL (ref 30–100)

## 2019-02-20 ENCOUNTER — Ambulatory Visit
Admission: RE | Admit: 2019-02-20 | Discharge: 2019-02-20 | Disposition: A | Discharge status: Home / Self Care | Payer: Medicare Other | Source: Ambulatory Visit | Attending: Physician Assistant | Admitting: Physician Assistant

## 2019-02-20 DIAGNOSIS — R140 Abdominal distension (gaseous): Secondary | ICD-10-CM

## 2019-02-22 ENCOUNTER — Other Ambulatory Visit: Payer: Self-pay | Admitting: Physician Assistant

## 2019-02-22 DIAGNOSIS — K760 Fatty (change of) liver, not elsewhere classified: Secondary | ICD-10-CM

## 2019-02-22 DIAGNOSIS — K7460 Unspecified cirrhosis of liver: Secondary | ICD-10-CM

## 2019-02-22 DIAGNOSIS — R140 Abdominal distension (gaseous): Secondary | ICD-10-CM

## 2019-02-25 ENCOUNTER — Telehealth: Payer: Self-pay

## 2019-02-25 NOTE — Telephone Encounter (Signed)
-----   Message from Vicie Mutters, [place] sent at 02/25/2019  4:47 PM EST ----- Regarding: RE: GI REFERRAL If she is not having bad symptoms she can wait until Feb.  Amanda ----- Message ----- From: Elenor Quinones, CMA Sent: 02/25/2019   3:09 PM EST To: Vicie Mutters, PA-C Subject: GI REFERRAL                                    Patient would like to know if she could wait until FEB to go to GI because she wants to see a female Dr. Marland Kitchen this is when a FEMALE GI doctor has an opening. Basically is the issues STAT. She reports that she knows what to eat & not to not upset the GALLBLADDER. She also states that if you have any suggests she would be open to that as well.   Please & thank you

## 2019-02-25 NOTE — Telephone Encounter (Signed)
The patient has been notified of this information and all questions answered.

## 2019-04-08 ENCOUNTER — Other Ambulatory Visit (INDEPENDENT_AMBULATORY_CARE_PROVIDER_SITE_OTHER): Payer: Medicare PPO

## 2019-04-08 ENCOUNTER — Encounter: Payer: Self-pay | Admitting: Gastroenterology

## 2019-04-08 ENCOUNTER — Ambulatory Visit: Payer: Medicare PPO | Admitting: Gastroenterology

## 2019-04-08 VITALS — BP 174/86 | HR 90 | Temp 98.4°F | Ht 64.0 in | Wt 155.0 lb

## 2019-04-08 DIAGNOSIS — K760 Fatty (change of) liver, not elsewhere classified: Secondary | ICD-10-CM

## 2019-04-08 DIAGNOSIS — K7460 Unspecified cirrhosis of liver: Secondary | ICD-10-CM | POA: Diagnosis not present

## 2019-04-08 DIAGNOSIS — Z01818 Encounter for other preprocedural examination: Secondary | ICD-10-CM

## 2019-04-08 LAB — CBC WITH DIFFERENTIAL/PLATELET
Basophils Absolute: 0.1 10*3/uL (ref 0.0–0.1)
Basophils Relative: 0.7 % (ref 0.0–3.0)
Eosinophils Absolute: 0.2 10*3/uL (ref 0.0–0.7)
Eosinophils Relative: 1.9 % (ref 0.0–5.0)
HCT: 40.7 % (ref 36.0–46.0)
Hemoglobin: 13.9 g/dL (ref 12.0–15.0)
Lymphocytes Relative: 21.8 % (ref 12.0–46.0)
Lymphs Abs: 2.1 10*3/uL (ref 0.7–4.0)
MCHC: 34.3 g/dL (ref 30.0–36.0)
MCV: 90.4 fl (ref 78.0–100.0)
Monocytes Absolute: 0.6 10*3/uL (ref 0.1–1.0)
Monocytes Relative: 6.6 % (ref 3.0–12.0)
Neutro Abs: 6.8 10*3/uL (ref 1.4–7.7)
Neutrophils Relative %: 69 % (ref 43.0–77.0)
Platelets: 225 10*3/uL (ref 150.0–400.0)
RBC: 4.5 Mil/uL (ref 3.87–5.11)
RDW: 12.6 % (ref 11.5–15.5)
WBC: 9.8 10*3/uL (ref 4.0–10.5)

## 2019-04-08 LAB — COMPREHENSIVE METABOLIC PANEL
ALT: 42 U/L — ABNORMAL HIGH (ref 0–35)
AST: 48 U/L — ABNORMAL HIGH (ref 0–37)
Albumin: 4.9 g/dL (ref 3.5–5.2)
Alkaline Phosphatase: 75 U/L (ref 39–117)
BUN: 13 mg/dL (ref 6–23)
CO2: 28 mEq/L (ref 19–32)
Calcium: 9.7 mg/dL (ref 8.4–10.5)
Chloride: 101 mEq/L (ref 96–112)
Creatinine, Ser: 0.8 mg/dL (ref 0.40–1.20)
GFR: 70.25 mL/min (ref >60.00)
Glucose, Bld: 100 mg/dL — ABNORMAL HIGH (ref 70–99)
Potassium: 3.9 mEq/L (ref 3.5–5.1)
Sodium: 138 mEq/L (ref 135–145)
Total Bilirubin: 0.7 mg/dL (ref 0.2–1.2)
Total Protein: 8.3 g/dL (ref 6.0–8.3)

## 2019-04-08 LAB — PROTIME-INR
INR: 1.2 ratio — ABNORMAL HIGH (ref 0.8–1.0)
Prothrombin Time: 13.2 s — ABNORMAL HIGH (ref 9.6–13.1)

## 2019-04-08 MED ORDER — ONDANSETRON 4 MG PO TBDP: 4.0000 mg | ORAL_TABLET | Freq: Two times a day (BID) | ORAL | 0 refills | Status: DC

## 2019-04-08 MED ORDER — SUTAB 1479-225-188 MG PO TABS: 1.0000 | ORAL_TABLET | Freq: Every day | ORAL | 0 refills | Status: DC

## 2019-04-08 NOTE — Progress Notes (Signed)
Margaret Sullivan    [id number]    23-May-1945  Primary Care Physician:McKeown, Gwyndolyn Saxon, MD  Referring Physician: Vicie Mutters, PA-C [address] [place],  [place] [address]   Chief complaint: Fatty Liver HPI:  41 yr F here for new patient for evaluation for fatty liver and possible cirrhosis  Abdominal ultrasound February 20, 2019: Chronic cholelithiasis, course echotexture liver with slight lobulation of the liver suggestive of possible cirrhosis  Has modified her diet significantly after recent diagnosis, she is avoiding sugar, high carbohydrate and fat diet feeling better she does not have the right upper quadrant discomfort like she did before Denies any nausea, vomiting, abdominal pain, melena or bright red blood per rectum'    Hepatic Function Latest Ref Rng & Units 02/09/2019 09/10/2017 09/10/2017  Total Protein 6.1 - 8.1 g/dL 8.3(H) 7.6 7.6  Albumin 3.6 - 5.1 g/dL - - -  AST 10 - 35 U/L 61(H) - 52(H)  ALT 6 - 29 U/L 47(H) - 48(H)  Alk Phosphatase 33 - 130 U/L - - -  Total Bilirubin 0.2 - 1.2 mg/dL 0.5 - 0.7  Bilirubin, Direct 0.0 - 0.2 mg/dL - - 0.1   Colonoscopy Jul 25, 2006: 3 mm sessile hyperplastic polyp removed from sigmoid colon, left-sided diverticulosis and internal and external hemorrhoids.  History of aspiration of pneumonia following colonoscopy on few occasions before, she is hesitant to undergo colonoscopy and has not done Cologuard either  Outpatient Encounter Medications as of 04/08/2019  Medication Sig  . ergocalciferol (VITAMIN D2) 1.25 MG (50000 UT) capsule Take 50,000 Units by mouth once a week.  . ondansetron (ZOFRAN ODT) 4 MG disintegrating tablet Take 1 tablet (4 mg total) by mouth 2 (two) times daily. To use with colon prep  . Sodium Sulfate-Mag Sulfate-KCl (SUTAB) [phone] MG TABS Take 1 kit by mouth daily.   No facility-administered encounter medications on file as of 04/08/2019.    Allergies as of 04/08/2019 -  Review Complete 02/09/2019  Allergen Reaction Noted  . Ampicillin Hives and Swelling 06/09/2013  . Aspirin Itching 06/09/2013  . Doxycycline Itching and Swelling 06/09/2013  . Macrodantin [nitrofurantoin macrocrystal] Hives, Itching, and Swelling 06/09/2013  . Percocet [oxycodone-acetaminophen]  07/01/2013  . Propulsid [cisapride] Itching and Swelling 06/09/2013  . Voltaren [diclofenac] Hives and Itching 06/09/2013  . Codeine Palpitations 06/09/2013  . Penicillins Swelling and Rash 06/09/2013    Past Medical History:  Diagnosis Date  . Essential tremor   . GERD (gastroesophageal reflux disease)   . Heart murmur   . High cholesterol   . Hyperlipidemia   . Hypertension    labile  . Labile hypertension   . Prediabetes     Past Surgical History:  Procedure Laterality Date  . BACK SURGERY      Family History  Problem Relation Age of Onset  . Lymphoma Mother   . Heart attack Father     Social History   Socioeconomic History  . Marital status: Divorced    Spouse name: Not on file  . Number of children: Not on file  . Years of education: Not on file  . Highest education level: Not on file  Occupational History  . Not on file  Tobacco Use  . Smoking status: Never Smoker  . Smokeless tobacco: Never Used  Substance and Sexual Activity  . Alcohol use: No  . Drug use: No  . Sexual activity: Not on file  Other Topics Concern  .  Not on file  Social History Narrative  . Not on file   Social Determinants of Health   Financial Resource Strain:   . Difficulty of Paying Living Expenses: Not on file  Food Insecurity:   . Worried About Charity fundraiser in the Last Year: Not on file  . Ran Out of Food in the Last Year: Not on file  Transportation Needs:   . Lack of Transportation (Medical): Not on file  . Lack of Transportation (Non-Medical): Not on file  Physical Activity:   . Days of Exercise per Week: Not on file  . Minutes of Exercise per Session: Not on file    Stress:   . Feeling of Stress : Not on file  Social Connections:   . Frequency of Communication with Friends and Family: Not on file  . Frequency of Social Gatherings with Friends and Family: Not on file  . Attends Religious Services: Not on file  . Active Member of Clubs or Organizations: Not on file  . Attends Archivist Meetings: Not on file  . Marital Status: Not on file  Intimate Partner Violence:   . Fear of Current or Ex-Partner: Not on file  . Emotionally Abused: Not on file  . Physically Abused: Not on file  . Sexually Abused: Not on file      Review of systems: All other review of systems negative except as mentioned in the HPI.   Physical Exam: Vitals:   04/08/19 1045  BP: (!) 174/86  Pulse: 90  Temp: 98.4 F (36.9 C)   Body mass index is 26.61 kg/m. Gen:      No acute distress HEENT:   sclera anicteric Neuro: alert and oriented x 3 Psych: normal mood and affect  Data Reviewed:  Reviewed labs, radiology imaging, old records and pertinent past GI work up   Assessment and Plan/Recommendations:  74 year old female with history of hyperlipidemia, fatty liver with recent findings on ultrasound suggestive of possible cirrhosis  Based on labs and exam, no clear signs of portal hypertension or cirrhosis Check CBC, CMP, PT/INR    AFP for hepatocellular carcinoma screening  We will schedule for ultrasound with elastography to evaluate for fibrosis of liver  Schedule for EGD to screen for esophageal varices and portal hypertensive gastropathy  No evidence of volume overload or ascites  Past due for colorectal cancer screening, schedule for colonoscopy along with EGD Use Zofran 4 mg twice daily as needed during bowel prep to prevent nausea  Discussed low carbohydrate and low-fat diet, avoid drinks with added sugar or soda Avoid alcohol, over-the-counter herbal remedies or hepatotoxins  Start taking vitamin E 400 IU daily, discussed with  patient that there is limited data to support benefit in patients with nonalcoholic steatohepatitis  Return after EGD and colonoscopy  The patient was provided an opportunity to ask questions and all were answered. The patient agreed with the plan and demonstrated an understanding of the instructions.  Damaris Hippo , MD    CC: Vicie Mutters, PA-C

## 2019-04-08 NOTE — Patient Instructions (Addendum)
You have been scheduled for an abdominal ultrasound/ RUQ Elastography at [facility] (1st floor of hospital) on 04/13/2019 at [place]. Please arrive 15 minutes prior to your appointment for registration. Make certain not to have anything to eat or drink 6 hours prior to your appointment. Should you need to reschedule your appointment, please contact radiology at [phone]. This test typically takes about 30 minutes to perform.  You have been scheduled for an endoscopy and colonoscopy. Please follow the written instructions given to you at your visit today. Please pick up your prep supplies at the pharmacy within the next 1-3 days. If you use inhalers (even only as needed), please bring them with you on the day of your procedure.   Go to the basement for labs today  Take Vitamin E 400 IU daily  We will send in Zofran to your pharmacy\\to take with Prep on 05/21/19.

## 2019-04-09 LAB — AFP TUMOR MARKER: AFP-Tumor Marker: 6.2 ng/mL — ABNORMAL HIGH

## 2019-04-13 ENCOUNTER — Ambulatory Visit (HOSPITAL_COMMUNITY)
Admission: RE | Admit: 2019-04-13 | Discharge: 2019-04-13 | Disposition: A | Discharge status: Home / Self Care | Payer: Medicare PPO | Source: Ambulatory Visit | Attending: Gastroenterology | Admitting: Gastroenterology

## 2019-04-13 ENCOUNTER — Other Ambulatory Visit: Payer: Self-pay

## 2019-04-13 DIAGNOSIS — K760 Fatty (change of) liver, not elsewhere classified: Secondary | ICD-10-CM | POA: Diagnosis not present

## 2019-04-13 DIAGNOSIS — K7460 Unspecified cirrhosis of liver: Secondary | ICD-10-CM | POA: Diagnosis not present

## 2019-04-14 ENCOUNTER — Telehealth: Payer: Self-pay | Admitting: Gastroenterology

## 2019-04-14 NOTE — Telephone Encounter (Signed)
Spoke with the patient

## 2019-04-29 ENCOUNTER — Telehealth: Payer: Self-pay

## 2019-04-29 NOTE — Telephone Encounter (Signed)
Received fax from pharmacy that [facility] will cost [phone] and Patient requesting a different prep. Procedure not until 05-21-19.  Will contact sutab rep to determine if price could be lower than $64.

## 2019-04-30 NOTE — Telephone Encounter (Signed)
Our Sutab rep was able to process a discount code that decreased the price to $40.  Patient informed and pleased with the reduced price.

## 2019-05-19 ENCOUNTER — Telehealth: Payer: Self-pay | Admitting: Gastroenterology

## 2019-05-19 ENCOUNTER — Ambulatory Visit (INDEPENDENT_AMBULATORY_CARE_PROVIDER_SITE_OTHER): Payer: Medicare PPO

## 2019-05-19 ENCOUNTER — Other Ambulatory Visit: Payer: Self-pay | Admitting: Gastroenterology

## 2019-05-19 DIAGNOSIS — Z1159 Encounter for screening for other viral diseases: Secondary | ICD-10-CM | POA: Diagnosis not present

## 2019-05-19 LAB — SARS CORONAVIRUS 2 (TAT 6-24 HRS): SARS Coronavirus 2: NEGATIVE

## 2019-05-19 NOTE — Telephone Encounter (Signed)
The pt has been advised that she can proceed with the procedure as planned.  She will follow the diet as directed from this point forward.  She will call with any further questions.

## 2019-05-21 ENCOUNTER — Encounter: Payer: Self-pay | Admitting: Gastroenterology

## 2019-05-21 ENCOUNTER — Other Ambulatory Visit: Payer: Self-pay

## 2019-05-21 ENCOUNTER — Ambulatory Visit (AMBULATORY_SURGERY_CENTER): Payer: Medicare PPO | Admitting: Gastroenterology

## 2019-05-21 VITALS — BP 149/78 | HR 81 | Temp 97.5°F | Resp 18 | Ht 64.0 in | Wt 155.0 lb

## 2019-05-21 DIAGNOSIS — D128 Benign neoplasm of rectum: Secondary | ICD-10-CM

## 2019-05-21 DIAGNOSIS — D120 Benign neoplasm of cecum: Secondary | ICD-10-CM

## 2019-05-21 DIAGNOSIS — R188 Other ascites: Secondary | ICD-10-CM

## 2019-05-21 DIAGNOSIS — K7460 Unspecified cirrhosis of liver: Secondary | ICD-10-CM | POA: Diagnosis not present

## 2019-05-21 DIAGNOSIS — K2900 Acute gastritis without bleeding: Secondary | ICD-10-CM | POA: Diagnosis not present

## 2019-05-21 DIAGNOSIS — D122 Benign neoplasm of ascending colon: Secondary | ICD-10-CM | POA: Diagnosis not present

## 2019-05-21 DIAGNOSIS — D129 Benign neoplasm of anus and anal canal: Secondary | ICD-10-CM | POA: Diagnosis not present

## 2019-05-21 DIAGNOSIS — K621 Rectal polyp: Secondary | ICD-10-CM | POA: Diagnosis not present

## 2019-05-21 DIAGNOSIS — K760 Fatty (change of) liver, not elsewhere classified: Secondary | ICD-10-CM | POA: Diagnosis not present

## 2019-05-21 DIAGNOSIS — K449 Diaphragmatic hernia without obstruction or gangrene: Secondary | ICD-10-CM | POA: Diagnosis not present

## 2019-05-21 DIAGNOSIS — K2100 Gastro-esophageal reflux disease with esophagitis, without bleeding: Secondary | ICD-10-CM | POA: Diagnosis not present

## 2019-05-21 DIAGNOSIS — Z1211 Encounter for screening for malignant neoplasm of colon: Secondary | ICD-10-CM | POA: Diagnosis not present

## 2019-05-21 DIAGNOSIS — D123 Benign neoplasm of transverse colon: Secondary | ICD-10-CM | POA: Diagnosis not present

## 2019-05-21 DIAGNOSIS — K3189 Other diseases of stomach and duodenum: Secondary | ICD-10-CM | POA: Diagnosis not present

## 2019-05-21 MED ORDER — SODIUM CHLORIDE 0.9 % IV SOLN: 500.0000 mL | Freq: Once | INTRAVENOUS | Status: DC

## 2019-05-21 NOTE — Op Note (Signed)
[facility] Patient Name: Margaret Sullivan Procedure Date: 05/21/2019 8:03 AM MRN: [id number] Endoscopist: Mauri Pole , MD Age: 74 Referring MD:  Date of Birth: 04-Mar-1946 Gender: Female Account #: [id number] Procedure:                Upper GI endoscopy Indications:              To evaluate esophageal varices in patient with                            suspected portal hypertension Medicines:                Monitored Anesthesia Care Procedure:                Pre-Anesthesia Assessment:                           - Prior to the procedure, a History and Physical                            was performed, and patient medications and                            allergies were reviewed. The patient's tolerance of                            previous anesthesia was also reviewed. The risks                            and benefits of the procedure and the sedation                            options and risks were discussed with the patient.                            All questions were answered, and informed consent                            was obtained. Prior Anticoagulants: The patient has                            taken no previous anticoagulant or antiplatelet                            agents. ASA Grade Assessment: II - A patient with                            mild systemic disease. After reviewing the risks                            and benefits, the patient was deemed in                            satisfactory condition to undergo the procedure.  After obtaining informed consent, the endoscope was                            passed under direct vision. Throughout the                            procedure, the patient's blood pressure, pulse, and                            oxygen saturations were monitored continuously. The                            Endoscope was introduced through the mouth, and                            advanced to the second  part of duodenum. The upper                            GI endoscopy was accomplished without difficulty.                            The patient tolerated the procedure well. Scope In: Scope Out: Findings:                 LA Grade B (one or more mucosal breaks greater than                            5 mm, not extending between the tops of two mucosal                            folds) esophagitis with no bleeding was found 34 to                            35 cm from the incisors.                           The Z-line was regular and was found 35 cm from the                            incisors.                           A small hiatal hernia was present.                           Patchy moderate inflammation with hemorrhage                            characterized by adherent blood, congestion                            (edema), erosions and erythema was found in the  entire examined stomach. Biopsies were taken with a                            cold forceps for Helicobacter pylori testing.                           The examined duodenum was normal. Complications:            No immediate complications. Estimated Blood Loss:     Estimated blood loss was minimal. Impression:               - LA Grade B reflux esophagitis with no bleeding.                           - Z-line regular, 35 cm from the incisors.                           - Small hiatal hernia.                           - Gastritis with hemorrhage. Biopsied.                           - Normal examined duodenum. Recommendation:           - Patient has a contact number available for                            emergencies. The signs and symptoms of potential                            delayed complications were discussed with the                            patient. Return to normal activities tomorrow.                            Written discharge instructions were provided to the                             patient.                           - Resume previous diet.                           - Continue present medications.                           - Await pathology results. Mauri Pole, MD 05/21/2019 8:47:22 AM This report has been signed electronically.

## 2019-05-21 NOTE — Patient Instructions (Signed)
Please read handouts provided. Continue present medications. Await pathology results. Return to GI clinic in 3 months.       YOU HAD AN ENDOSCOPIC PROCEDURE TODAY AT [facility]:   Refer to the procedure report that was given to you for any specific questions about what was found during the examination.  If the procedure report does not answer your questions, please call your gastroenterologist to clarify.  If you requested that your care partner not be given the details of your procedure findings, then the procedure report has been included in a sealed envelope for you to review at your convenience later.  YOU SHOULD EXPECT: Some feelings of bloating in the abdomen. Passage of more gas than usual.  Walking can help get rid of the air that was put into your GI tract during the procedure and reduce the bloating. If you had a lower endoscopy (such as a colonoscopy or flexible sigmoidoscopy) you may notice spotting of blood in your stool or on the toilet paper. If you underwent a bowel prep for your procedure, you may not have a normal bowel movement for a few days.  Please Note:  You might notice some irritation and congestion in your nose or some drainage.  This is from the oxygen used during your procedure.  There is no need for concern and it should clear up in a day or so.  SYMPTOMS TO REPORT IMMEDIATELY:   Following lower endoscopy (colonoscopy or flexible sigmoidoscopy):  Excessive amounts of blood in the stool  Significant tenderness or worsening of abdominal pains  Swelling of the abdomen that is new, acute  Fever of 100F or higher   Following upper endoscopy (EGD)  Vomiting of blood or coffee ground material  New chest pain or pain under the shoulder blades  Painful or persistently difficult swallowing  New shortness of breath  Fever of 100F or higher  Black, tarry-looking stools  For urgent or emergent issues, a gastroenterologist can be reached at any  hour by calling [phone]. Do not use MyChart messaging for urgent concerns.    DIET:  We do recommend a small meal at first, but then you may proceed to your regular diet.  Drink plenty of fluids but you should avoid alcoholic beverages for 24 hours.  ACTIVITY:  You should plan to take it easy for the rest of today and you should NOT DRIVE or use heavy machinery until tomorrow (because of the sedation medicines used during the test).    FOLLOW UP: Our staff will call the number listed on your records 48-72 hours following your procedure to check on you and address any questions or concerns that you may have regarding the information given to you following your procedure. If we do not reach you, we will leave a message.  We will attempt to reach you two times.  During this call, we will ask if you have developed any symptoms of COVID 19. If you develop any symptoms (ie: fever, flu-like symptoms, shortness of breath, cough etc.) before then, please call [phone].  If you test positive for Covid 19 in the 2 weeks post procedure, please call and report this information to [place].    If any biopsies were taken you will be contacted by phone or by letter within the next 1-3 weeks.  Please call us at [phone] if you have not heard about the biopsies in 3 weeks.    SIGNATURES/CONFIDENTIALITY: You and/or your care partner have signed  paperwork which will be entered into your electronic medical record.  These signatures attest to the fact that that the information above on your After Visit Summary has been reviewed and is understood.  Full responsibility of the confidentiality of this discharge information lies with you and/or your care-partner.

## 2019-05-21 NOTE — Progress Notes (Signed)
Temperature taken by J.B., VS taken by C.B.

## 2019-05-21 NOTE — Op Note (Signed)
[place] Patient Name: Margaret Sullivan Procedure Date: 05/21/2019 8:02 AM MRN: [id number] Endoscopist: Mauri Pole , MD Age: 74 Referring MD:  Date of Birth: [birth year] Gender: Female Account #: [id number] Procedure:                Colonoscopy Indications:              Screening for colorectal malignant neoplasm, Last                            colonoscopy: 2008 Medicines:                Monitored Anesthesia Care Procedure:                Pre-Anesthesia Assessment:                           - Prior to the procedure, a History and Physical                            was performed, and patient medications and                            allergies were reviewed. The patient's tolerance of                            previous anesthesia was also reviewed. The risks                            and benefits of the procedure and the sedation                            options and risks were discussed with the patient.                            All questions were answered, and informed consent                            was obtained. Prior Anticoagulants: The patient has                            taken no previous anticoagulant or antiplatelet                            agents. ASA Grade Assessment: II - A patient with                            mild systemic disease. After reviewing the risks                            and benefits, the patient was deemed in                            satisfactory condition to undergo the procedure.  After obtaining informed consent, the colonoscope                            was passed under direct vision. Throughout the                            procedure, the patient's blood pressure, pulse, and                            oxygen saturations were monitored continuously. The                            Colonoscope was introduced through the anus and                            advanced to the the cecum, identified  by                            appendiceal orifice and ileocecal valve. The                            colonoscopy was performed without difficulty. The                            patient tolerated the procedure well. The quality                            of the bowel preparation was excellent. The                            ileocecal valve, appendiceal orifice, and rectum                            were photographed. Scope In: 8:20:27 AM Scope Out: 8:41:34 AM Scope Withdrawal Time: 0 hours 18 minutes 38 seconds  Total Procedure Duration: 0 hours 21 minutes 7 seconds  Findings:                 The perianal and digital rectal examinations were                            normal.                           Four sessile polyps were found in the ascending                            colon, cecum and appendiceal orifice. The polyps                            were 4 to 9 mm in size. These polyps were removed                            with a cold snare. Resection and retrieval were  complete.                           Two sessile polyps were found in the rectum and                            ascending colon. The polyps were 1 to 2 mm in size.                            These polyps were removed with a cold biopsy                            forceps. Resection and retrieval were complete.                           A 20 mm polyp was found in the transverse colon.                            The polyp was carpet-like. The polyp was removed                            with a piecemeal technique using a cold snare.                            Resection and retrieval were complete.                           Scattered small and large-mouthed diverticula were                            found in the sigmoid colon and descending colon.                           Non-bleeding internal hemorrhoids were found during                            retroflexion. The hemorrhoids were small.                            The exam was otherwise without abnormality. Complications:            No immediate complications. Estimated Blood Loss:     Estimated blood loss was minimal. Impression:               - Four 4 to 9 mm polyps in the ascending colon, in                            the cecum and at the appendiceal orifice, removed                            with a cold snare. Resected and retrieved.                           - Two 1 to 2 mm polyps  in the rectum and in the                            ascending colon, removed with a cold biopsy                            forceps. Resected and retrieved.                           - One 20 mm polyp in the transverse colon, removed                            piecemeal using a cold snare. Resected and                            retrieved.                           - Diverticulosis in the sigmoid colon and in the                            descending colon.                           - Non-bleeding internal hemorrhoids.                           - The examination was otherwise normal. Recommendation:           - Patient has a contact number available for                            emergencies. The signs and symptoms of potential                            delayed complications were discussed with the                            patient. Return to normal activities tomorrow.                            Written discharge instructions were provided to the                            patient.                           - Resume previous diet.                           - Continue present medications.                           - Await pathology results.                           - Repeat colonoscopy in 3 years for surveillance  based on pathology results.                           - Return to GI clinic in 3 months. Mauri Pole, MD 05/21/2019 8:51:51 AM This report has been signed electronically.

## 2019-05-21 NOTE — Progress Notes (Signed)
pt tolerated well. VSS. awake and to recovery. Report given to RN.  

## 2019-05-21 NOTE — Progress Notes (Signed)
Called to room to assist during endoscopic procedure.  Patient ID and intended procedure confirmed with present staff. Received instructions for my participation in the procedure from the performing physician.  

## 2019-05-25 ENCOUNTER — Telehealth: Payer: Self-pay

## 2019-05-25 NOTE — Telephone Encounter (Signed)
  Follow up Call-  Call back number 05/21/2019  Post procedure Call Back phone  # [phone]  Permission to leave phone message Yes  Some recent data might be hidden     Patient questions:  Do you have a fever, pain , or abdominal swelling? No. Pain Score  0 *  Have you tolerated food without any problems? Yes.    Have you been able to return to your normal activities? Yes.    Do you have any questions about your discharge instructions: Diet   No. Medications  No. Follow up visit  No.  Do you have questions or concerns about your Care? No.  Actions: * If pain score is 4 or above: No action needed, pain <4. 1. Have you developed a fever since your procedure? no  2.   Have you had an respiratory symptoms (SOB or cough) since your procedure? no  3.   Have you tested positive for COVID 19 since your procedure no  4.   Have you had any family members/close contacts diagnosed with the COVID 19 since your procedure?  no   If yes to any of these questions please route to Joylene John, RN and Alphonsa Gin, Therapist, sports.

## 2019-05-26 ENCOUNTER — Encounter: Payer: Self-pay | Admitting: Gastroenterology

## 2019-07-02 DIAGNOSIS — M7130 Other bursal cyst, unspecified site: Secondary | ICD-10-CM | POA: Diagnosis not present

## 2019-07-02 DIAGNOSIS — L814 Other melanin hyperpigmentation: Secondary | ICD-10-CM | POA: Diagnosis not present

## 2019-07-02 DIAGNOSIS — L821 Other seborrheic keratosis: Secondary | ICD-10-CM | POA: Diagnosis not present

## 2019-07-02 DIAGNOSIS — L308 Other specified dermatitis: Secondary | ICD-10-CM | POA: Diagnosis not present

## 2019-07-14 NOTE — Progress Notes (Signed)
WELLNESS  Assessment and Plan:  Fatty liver Check labs, avoid tylenol, alcohol, weight loss advised.   Anxiety -     metoprolol succinate (TOPROL XL) 25 MG 24 hr tablet; Take 1 tablet (25 mg total) by mouth at bedtime. -     DHEA -     RPR -     Rocky mtn spotted fvr abs pnl(IgG+IgM) -     ALPRAZolam (XANAX) 0.25 MG tablet; Take 1/2 to 1 tablet 3 x daily as need for anxiety  Memory changes ? From anxiety Increase exercise, continue weight loss Declines many medications Declines MRI- will refer to neuro- normal neuro at this time Patient does have poorly controlled HTN, chol, and is preDM- declines meds, recheck with recent weight loss -     DHEA -     RPR -     Rocky mtn spotted fvr abs pnl(IgG+IgM) -     TSH -     Homocysteine -     Cancel: MTHFR DNA Analysis -     B. burgdorfi antibodies -     Ambulatory referral to Neurology  Rash -     RPR -     Rocky mtn spotted fvr abs pnl(IgG+IgM) -     Cancel: MTHFR DNA Analysis -     B. burgdorfi antibodies  Amaurosis fugax of left eye- questionable- suggest going to eye doctor and will refer to neuro -     Ambulatory referral to Neurology She was informed to call 911 if she develop any new symptoms such as worsening headaches, episodes of blurred vision, double vision or complete loss of vision or speech difficulties or motor weakness.  Anomia -     Cancel: MR Brain Wo Contrast; Future- DECLINES -     Ambulatory referral to Neurology  Essential tremor -     metoprolol succinate (TOPROL XL) 25 MG 24 hr tablet; Take 1 tablet (25 mg total) by mouth at bedtime  Wellness 1 year Get MGM  Hypertension -  DASH diet, exercise and monitor at home. Call if greater than 130/80.    Hyperlipidemia -not on any medications, check lipids, decrease fatty foods, increase activity.  May want to consider getting cardiac calcium score  Abnormal glucose Discussed disease progression and risks Discussed diet/exercise, weight management and  risk modification   Vitamin D deficiency Continue supplement, has just restarted back   GERD Continue diet, may need H2/PPI if symptoms continue/come back  Medication management  Poor compliance with medical recommendations Prefers lifestyle approach and will do research on health topics  BMI 27.0-27.9,adult  Overweight  - long discussion about weight loss, diet, and exercise -recommended diet heavy in fruits and veggies and low in animal meats, cheeses, and dairy products  Discussed med's effects and SE's. Screening labs and tests as requested with regular follow-up as recommended. Over 30 minutes of exam, counseling, chart review, and complex, high level critical decision making was performed this visit.  Future Appointments  Date Time Provider [place]  08/17/2019 11:15 AM Vicie Mutters, PA-C [facility] None  02/09/2020  3:00 PM Vicie Mutters, PA-C [facility] None    Medicare Attestation I have personally reviewed: The patient's medical and social history Their use of alcohol, tobacco or illicit drugs Their current medications and supplements The patient's functional ability including ADLs,fall risks, home safety risks, cognitive, and hearing and visual impairment Diet and physical activities Evidence for depression or mood disorders  The patient's weight, height, BMI, and visual acuity have been  recorded in the chart.  I have made referrals, counseling, and provided education to the patient based on review of the above and I have provided the patient with a written personalized care plan for preventive services.    MEDICARE WELLNESS OBJECTIVES: Physical activity: Current Exercise Habits: The patient does not participate in regular exercise at present Cardiac risk factors: Cardiac Risk Factors include: advanced age (>81mn, >>19women);dyslipidemia;hypertension;obesity (BMI >30kg/m2);sedentary lifestyle Depression/mood screen:   [facility]  07/15/2019  Decreased Interest 0  Down, Depressed, Hopeless 1  PHQ - 2 Score 1    ADLs:  In your present state of health, do you have any difficulty performing the following activities: 07/15/2019 08/01/2018  Hearing? N N  Vision? N N  Difficulty concentrating or making decisions? Y N  Walking or climbing stairs? N N  Dressing or bathing? N N  Doing errands, shopping? N N  Some recent data might be hidden     Cognitive Testing  Alert? Yes  Normal Appearance?Yes  Oriented to person? Yes  Place? Yes   Time? Yes  Recall of three objects?  Yes  Can perform simple calculations? Yes  Displays appropriate judgment?Yes  Can read the correct time from a watch face?Yes  EOL planning: Does Patient Have a Medical Advance Directive?: Yes Does patient want to make changes to medical advance directive?: No - Patient declined Yes   HPI  74y.o. female  presents for medicare wellness and follow up for anxiety.   She had a colonoscopy and EGD with Dr. NSilverio Decamp will follow up there for fatty liver- had normal liver scan. Will need repeat colonoscopy 3 years.   BMI is Body mass index is 26.3 kg/m. She has been working on diet and exercise and has done a good job with weight loss.   She states she has dealt with anxiety for years, worse last 2 years. She states she also uses food as coping mechanism. She has been having trouble sleeping. 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 She has been having more digestive issues lately, she has been eating more lately. She has a lot of anxiety over not getting the shot.  She is not coping with stress well, she is having memory concerns, short term memory issues. She has anomia. She has history of essential tremor, saw Dr. LErling Cruzin the past, states left hand tremor worse.  She has history of vascular migraine, episodes in the 80's, but then 1-2 weeks ago had feeling she could not see, did not have access to all of her  vision.  She had a normal B12, normal zinc, normal iron Wt Readings from Last 3 Encounters:  07/15/19 153 lb 3.2 oz (69.5 kg)  05/21/19 155 lb (70.3 kg)  04/08/19 155 lb (70.3 kg)   Her blood pressure has been controlled at home, she is controlling diet/exercise, today their BP is BP: (!) 142/92   She does workout. She denies chest pain, shortness of breath, dizziness.  She is not on cholesterol medication, she declines statins.  Her cholesterol is not at goal. The cholesterol last visit was:   Lab Results  Component Value Date   CHOL 229 (H) 02/09/2019   HDL 40 (L) 02/09/2019   LDLCALC 159 (H) 02/09/2019   TRIG 161 (H) 02/09/2019   CHOLHDL 5.7 (H) 02/09/2019   She has been working on diet and exercise for prediabetes, and denies paresthesia of the feet,  polydipsia, polyuria and visual disturbances. Last A1C in the office was:  Lab Results  Component Value Date   HGBA1C 5.7 (H) 02/09/2019   Patient is on Vitamin D supplement, started back 5000 IU a day.   Lab Results  Component Value Date   VD25OH 32 02/09/2019    Current Medications:  Current Outpatient Medications on File Prior to Visit  Medication Sig Dispense Refill  . ergocalciferol (VITAMIN D2) 1.25 MG (50000 UT) capsule Take 50,000 Units by mouth once a week.     No current facility-administered medications on file prior to visit.   Health Maintenance:   Immunization History  Administered Date(s) Administered  . Pneumococcal-Unspecified 03/05/1997  . Td 03/05/1998   Last colonoscopy: 2021 Dr. Silverio Decamp- repeat 3 years EGD 05/21/2019 Last mammogram: 07/2013 normal, DUE will call for an appointment Last pap smear/pelvic exam: 2010, declines another DEXA: 6-7 years, normal, declines another Hep panel neg 2013 [place] AB 02/2019-1. Chronic cholelithiasis without evidence of acute cholecystitis. 2. Coarse echotexture of the liver with slight lobulation of the liver suggesting the possibility of cirrhosis.  US neck  2013 CXR 04/2008  Prior vaccinations: TD or Tdap: 2000, DUE Influenza: declines Pneumococcal: 1999 Prevnar 13: declines Shingles: declines COVID: declines  Last Dental Exam: Dr. Gloriann Loan, 2020, had extraction and is going to have another Last Eye Exam: Dr. Delman Cheadle, wears glasses, 2020 Patient Care Team: Unk Pinto, MD as PCP - General (Internal Medicine) Sharyne Peach, MD as Consulting Physician (Ophthalmology) Earley Favor (Dentistry)  Medical History:  Past Medical History:  Diagnosis Date  . Allergy   . Essential tremor   . GERD (gastroesophageal reflux disease)   . Heart murmur   . High cholesterol   . Hyperlipidemia   . Hypertension    labile  . Labile hypertension   . Prediabetes    Allergies Allergies  Allergen Reactions  . Ampicillin Hives and Swelling  . Aspirin Itching  . Doxycycline Itching and Swelling  . Macrodantin [Nitrofurantoin Macrocrystal] Hives, Itching and Swelling  . Percocet [Oxycodone-Acetaminophen]   . Propulsid [Cisapride] Itching and Swelling  . Voltaren [Diclofenac] Hives and Itching  . Codeine Palpitations  . Penicillins Swelling and Rash    SURGICAL HISTORY She  has a past surgical history that includes Back surgery; Upper gastrointestinal endoscopy; Colonoscopy; Tonsillectomy and adenoidectomy (Bilateral); and Wisdom tooth extraction (Bilateral). FAMILY HISTORY Her family history includes Colon cancer in her maternal uncle; Heart attack in her father; Lymphoma in her mother. SOCIAL HISTORY She  reports that she has never smoked. She has never used smokeless tobacco. She reports that she does not drink alcohol or use drugs.   Review of Systems: Review of Systems  Constitutional: Negative.   HENT: Negative.   Eyes: Positive for blurred vision.  Respiratory: Negative.  Negative for shortness of breath.   Cardiovascular: Negative.  Negative for chest pain.  Gastrointestinal: Negative for abdominal pain, blood in stool,  constipation, diarrhea, heartburn, melena, nausea and vomiting.       + hemorrhoid  Genitourinary: Negative.  Negative for dysuria, flank pain, frequency, hematuria and urgency.  Musculoskeletal: Negative.  Negative for falls.  Skin: Negative.   Neurological: Negative.   Psychiatric/Behavioral: Positive for memory loss. Negative for depression. The patient is nervous/anxious.     Physical Exam: Estimated body mass index is 26.3 kg/m as calculated from the following:   Height as of 05/21/19: 5' 4"  (1.626 m).   Weight as of this encounter: 153 lb 3.2 oz (69.5 kg). BP Marland Kitchen)  142/92   Pulse 76   Temp 98.6 F (37 C)   Wt 153 lb 3.2 oz (69.5 kg)   SpO2 98%   BMI 26.30 kg/m  General Appearance: Well nourished, in no apparent distress.  Eyes: PERRLA, EOMs, conjunctiva no swelling or erythema, normal fundi and vessels.  Sinuses: No Frontal/maxillary tenderness  ENT/Mouth: Ext aud canals clear, normal light reflex with TMs without erythema, bulging. Good dentition. No erythema, swelling, or exudate on post pharynx. Tonsils not swollen or erythematous. Hearing normal.  Neck: Supple, thyroid normal. No bruits  Respiratory: Respiratory effort normal, BS equal bilaterally without rales, rhonchi, wheezing or stridor.  Cardio: RRR without murmurs, rubs or gallops. Brisk peripheral pulses without edema.  Chest: symmetric, with normal excursions and percussion.  Breasts: Symmetric, without lumps, nipple discharge, retractions.  Abdomen: Soft, slight upper AB distention however nontender, no guarding, rebound, hernias, masses, or organomegaly.  Lymphatics: Non tender without lymphadenopathy.  Genitourinary: defer Musculoskeletal: Full ROM all peripheral extremities,5/5 strength, and normal gait.  Skin: Warm, dry without rashes, lesions, ecchymosis. Neuro: Cranial nerves intact, reflexes equal bilaterally. Normal muscle tone, no cerebellar symptoms. Sensation intact.  Psych: Awake and oriented X 3,  normal affect, Insight and Judgment appropriate.    Vicie Mutters 12:56 PM [facility]

## 2019-07-15 ENCOUNTER — Encounter: Payer: Self-pay | Admitting: Physician Assistant

## 2019-07-15 ENCOUNTER — Ambulatory Visit: Payer: Medicare Other | Admitting: Physician Assistant

## 2019-07-15 ENCOUNTER — Other Ambulatory Visit: Payer: Self-pay

## 2019-07-15 VITALS — BP 142/92 | HR 76 | Temp 98.6°F | Wt 153.2 lb

## 2019-07-15 DIAGNOSIS — Z9119 Patient's noncompliance with other medical treatment and regimen: Secondary | ICD-10-CM | POA: Diagnosis not present

## 2019-07-15 DIAGNOSIS — R413 Other amnesia: Secondary | ICD-10-CM | POA: Diagnosis not present

## 2019-07-15 DIAGNOSIS — R21 Rash and other nonspecific skin eruption: Secondary | ICD-10-CM | POA: Diagnosis not present

## 2019-07-15 DIAGNOSIS — E782 Mixed hyperlipidemia: Secondary | ICD-10-CM

## 2019-07-15 DIAGNOSIS — K7460 Unspecified cirrhosis of liver: Secondary | ICD-10-CM

## 2019-07-15 DIAGNOSIS — R7309 Other abnormal glucose: Secondary | ICD-10-CM

## 2019-07-15 DIAGNOSIS — Z91199 Patient's noncompliance with other medical treatment and regimen due to unspecified reason: Secondary | ICD-10-CM

## 2019-07-15 DIAGNOSIS — Z79899 Other long term (current) drug therapy: Secondary | ICD-10-CM

## 2019-07-15 DIAGNOSIS — K2100 Gastro-esophageal reflux disease with esophagitis, without bleeding: Secondary | ICD-10-CM | POA: Diagnosis not present

## 2019-07-15 DIAGNOSIS — R0989 Other specified symptoms and signs involving the circulatory and respiratory systems: Secondary | ICD-10-CM

## 2019-07-15 DIAGNOSIS — K760 Fatty (change of) liver, not elsewhere classified: Secondary | ICD-10-CM | POA: Diagnosis not present

## 2019-07-15 DIAGNOSIS — G453 Amaurosis fugax: Secondary | ICD-10-CM

## 2019-07-15 DIAGNOSIS — R6889 Other general symptoms and signs: Secondary | ICD-10-CM

## 2019-07-15 DIAGNOSIS — F419 Anxiety disorder, unspecified: Secondary | ICD-10-CM | POA: Diagnosis not present

## 2019-07-15 DIAGNOSIS — E559 Vitamin D deficiency, unspecified: Secondary | ICD-10-CM | POA: Diagnosis not present

## 2019-07-15 DIAGNOSIS — Z0000 Encounter for general adult medical examination without abnormal findings: Secondary | ICD-10-CM

## 2019-07-15 DIAGNOSIS — Z0001 Encounter for general adult medical examination with abnormal findings: Secondary | ICD-10-CM | POA: Diagnosis not present

## 2019-07-15 DIAGNOSIS — G250 Essential tremor: Secondary | ICD-10-CM

## 2019-07-15 DIAGNOSIS — R488 Other symbolic dysfunctions: Secondary | ICD-10-CM

## 2019-07-15 DIAGNOSIS — R29818 Other symptoms and signs involving the nervous system: Secondary | ICD-10-CM

## 2019-07-15 MED ORDER — ALPRAZOLAM 0.25 MG PO TABS: ORAL_TABLET | ORAL | 0 refills | Status: DC

## 2019-07-15 MED ORDER — METOPROLOL SUCCINATE ER 25 MG PO TB24: 25.0000 mg | ORAL_TABLET | Freq: Every day | ORAL | 11 refills | Status: DC

## 2019-07-15 NOTE — Patient Instructions (Addendum)
HOW TO SCHEDULE A MAMMOGRAM  [facility]  7 a.m.-6:30 p.m., Monday 7 a.m.-5 p.m., Tuesday-Friday Schedule an appointment by calling [phone].   Self-care:   Rinse your sinuses. Use a sinus rinse device to rinse your nasal passages with a saline (salt water) solution or distilled water. Do not use tap water. This will help thin the mucus in your nose and rinse away pollen and dirt. It will also help reduce swelling so you can breathe normally.    Breathe in steam. Heat a bowl of water until you see steam. Lean over the bowl and make a tent over your head with a large towel. Breathe deeply for about 20 minutes. Be careful not to get too close to the steam or burn yourself. Do this 3 times a day. You can also breathe deeply when you take a hot shower.    Sleep with your head elevated. Place an extra pillow under your head before you go to sleep to help your sinuses drain.    Drink liquids as directed. Ask your healthcare provider how much liquid to drink each day and which liquids are best for you. Liquids will thin the mucus in your nose and help it drain. Avoid drinks that contain alcohol or caffeine.   Prevent the spread of germs that cause sinusitis: Wash your hands often with soap and water. Wash your hands after you use the bathroom, change a child's diaper, or sneeze. Wash your hands before you prepare or eat food.   Follow up with your healthcare provider as directed: You may be referred to an ear, nose, and throat specialist. Write down your questions so you remember to ask them during your visits.    Counseling services   I suggest calling your insurance and finding out who is in your network and THEN calling those people or looking them up on google.   I'm a big fan of Cognitive Behavioral Therapy, look this up on You tube or check with the therapist you see if they are certified.  This form of therapy helps to teach you skills to better  handle with current situation that are causing anxiety or depression.   CHOLESTEROL  Your LDL could improve, ideally we want it under a 100.  Your LDL is the bad cholesterol that can lead to heart attack and stroke. To lower your number you can decrease your fatty foods, red meat, cheese, milk and increase fiber like whole grains and veggies. You can also add a fiber supplement like Citracel or Benefiber, these do not cause gas and bloating and are safe to use. Especially if you have a strong family history of heart disease or stroke or you have evidence of plaque on any imaging like a chest xray, we may discuss at your next office visit putting you on a medication to get your number below 100.   VACCINE INFORMATION  Here is some information about the vaccines. The data is very good and this information hopefully answers a lot of your questions and give you a confidence boost.   The Pfizer and Moderna vaccines are messenger RNA vaccines. That technology is not new, it has been studied for 20 years at least, used for cancer and MS treatment. They had started using the vaccine for MERS AND SARS (both a different coronavirus) 10 years ago but never finished so we had a good backbone for this vaccine.   There were no short cuts with the techniques  for these clinical  trials, just lots of willing participants quickly and lots of up front money helped speed up the process.   The mRNA is very fragile which is why it needs to be kept so cold and thawed a certain way, think of it as a message in a glass bottle. NO PART of the virus is in this vaccine, it is a clip of the genetic sequence. This mRNA is injected in your arm, connects with a ribosome, delivers the message and the degrades.  That is part of the cause of the sore arm, the mRNA never leaves your arm. It degrades there. The mRNA does not go into our nucleotide where our DNA is, and we would need a DNA reverse transcriptase to take RNA to DNA, we do not  have this, it can not change our DNA. The ribosome that got the message creates a protein and that protein circulates in our body and we have an immune reaction to that creating antibodies. Any time our immune system is triggered, inflammation is triggered too so you can have a temp, muscle aches, etc. Normal reaction.  I have seen so many patients that just had mild COVID in the office weeks later still have issues. We are still learning about post COVID syndrome, the CDC should be coming out for guidelines for practioners soon. There is too much unknown about COVID. We have been using vaccines for over 100 years or more, i Personnel officer. Ask your parents or any older friends about polio and that vaccine, that was a disease shutting down schools,had kids in iron lung, devastating young kids and families. People lined up for that vaccine and technology has only improved.   Please get the vaccine. If you have any further questions please make an appointment in the office to discuss further.  Estill Bamberg

## 2019-07-21 LAB — COMPLETE METABOLIC PANEL WITH GFR
AG Ratio: 1.6 (calc) (ref 1.0–2.5)
ALT: 27 U/L (ref 6–29)
AST: 34 U/L (ref 10–35)
Albumin: 5 g/dL (ref 3.6–5.1)
Alkaline phosphatase (APISO): 69 U/L (ref 37–153)
BUN: 11 mg/dL (ref 7–25)
CO2: 29 mmol/L (ref 20–32)
Calcium: 10 mg/dL (ref 8.6–10.4)
Chloride: 102 mmol/L (ref 98–110)
Creat: 0.74 mg/dL (ref 0.60–0.93)
GFR, Est African American: 93 mL/min/{1.73_m2} (ref >=60)
GFR, Est Non African American: 80 mL/min/{1.73_m2} (ref >=60)
Globulin: 3.2 g/dL (calc) (ref 1.9–3.7)
Glucose, Bld: 101 mg/dL — ABNORMAL HIGH (ref 65–99)
Potassium: 4.4 mmol/L (ref 3.5–5.3)
Sodium: 139 mmol/L (ref 135–146)
Total Bilirubin: 0.8 mg/dL (ref 0.2–1.2)
Total Protein: 8.2 g/dL — ABNORMAL HIGH (ref 6.1–8.1)

## 2019-07-21 LAB — HOMOCYSTEINE: Homocysteine: 14.8 umol/L — ABNORMAL HIGH (ref <10.4)

## 2019-07-21 LAB — RPR: RPR Ser Ql: NONREACTIVE

## 2019-07-21 LAB — B. BURGDORFI ANTIBODIES: B burgdorferi Ab IgG+IgM: <0.9 index

## 2019-07-21 LAB — ROCKY MTN SPOTTED FVR ABS PNL(IGG+IGM)
RMSF IgG: NOT DETECTED
RMSF IgM: NOT DETECTED

## 2019-07-21 LAB — TSH: TSH: 2.77 mIU/L (ref 0.40–4.50)

## 2019-07-21 LAB — DHEA: DHEA: 152 ng/dL

## 2019-07-21 LAB — VITAMIN D 25 HYDROXY (VIT D DEFICIENCY, FRACTURES): Vit D, 25-Hydroxy: 39 ng/mL (ref 30–100)

## 2019-08-17 ENCOUNTER — Ambulatory Visit: Payer: Medicare Other | Admitting: Physician Assistant

## 2019-09-02 ENCOUNTER — Ambulatory Visit (INDEPENDENT_AMBULATORY_CARE_PROVIDER_SITE_OTHER): Payer: Medicare PPO | Admitting: Physician Assistant

## 2019-09-02 ENCOUNTER — Other Ambulatory Visit: Payer: Self-pay

## 2019-09-02 ENCOUNTER — Encounter: Payer: Self-pay | Admitting: Physician Assistant

## 2019-09-02 VITALS — BP 130/70 | HR 87 | Temp 97.7°F | Ht 64.0 in | Wt 156.0 lb

## 2019-09-02 DIAGNOSIS — G453 Amaurosis fugax: Secondary | ICD-10-CM | POA: Diagnosis not present

## 2019-09-02 DIAGNOSIS — E7211 Homocystinuria: Secondary | ICD-10-CM

## 2019-09-02 DIAGNOSIS — R488 Other symbolic dysfunctions: Secondary | ICD-10-CM | POA: Diagnosis not present

## 2019-09-02 DIAGNOSIS — R7309 Other abnormal glucose: Secondary | ICD-10-CM

## 2019-09-02 DIAGNOSIS — D649 Anemia, unspecified: Secondary | ICD-10-CM

## 2019-09-02 DIAGNOSIS — R0989 Other specified symptoms and signs involving the circulatory and respiratory systems: Secondary | ICD-10-CM

## 2019-09-02 DIAGNOSIS — E782 Mixed hyperlipidemia: Secondary | ICD-10-CM | POA: Diagnosis not present

## 2019-09-02 DIAGNOSIS — R7983 Abnormal findings of blood amino-acid level: Secondary | ICD-10-CM

## 2019-09-02 NOTE — Progress Notes (Signed)
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 Call if greater than 130/80.   Mixed hyperlipidemia -     Lipid panel discussed possible cardiac calcium score in the future to see if she needs statin/better stratify risk- no CP/SOB  Anemia, unspecified type -     Folate RBC  Abnormal glucose -     Hemoglobin A1c Discussed disease progression and risks Discussed diet/exercise, weight management and risk modification    Future Appointments  Date Time Provider [place]  09/15/2019  3:00 PM Penumalli, Earlean Polka, MD [facility] None  10/01/2019 10:40 AM Mauri Pole, MD [facility]  02/09/2020  3:00 PM Vicie Mutters, PA-C [facility] None     HPI 74 y.o.female presents for 1 month follow up after wellness visit.  Last visit she was complaining of anxiety, memory changes, anomia, tremor, saw Dr. Erling Cruz in the past, states the tremor is worse on the left. She was referred to neurology, has appt with Dr. Leta Baptist 07/13. She declines carotid US and MRI at the last visit.   She was started on low dose toprol for HTN and anxiety, she is very resistant to medications. She did not take the metoprolol however she did increase water, started walking, started devotion, meditation and states she feels much better.   She has history of vascular migraine, episodes in  the 80's, but had abnormal vision and HA in the last month- after discussing the abnormal vision further she denies complete loss of vision but states on the left side she could not comprehend what she was looking at. She then followed up with her orthodonist and had Xray that showed left root infection and going to get treatment that may have caused this pain.   She had a normal B12, normal zinc, normal iron, TSH, RPR, tick borne illness are normal. She did have an elevated homocystine.   BP Readings from Last 3 Encounters:  09/02/19 130/70  07/15/19 (!) 142/92  05/21/19 (!) 149/78   Lab Results  Component Value Date   CHOL 229 (H) 02/09/2019   HDL 40 (L) 02/09/2019   LDLCALC 159 (H) 02/09/2019   TRIG 161 (H) 02/09/2019   CHOLHDL 5.7 (H) 02/09/2019   Lab Results  Component Value Date   HGBA1C 5.7 (H) 02/09/2019     Patient Active Problem List   Diagnosis Date Noted   Fatty liver 09/05/2016   Poor compliance with medical recommendations 04/29/2014   Vitamin D deficiency 06/10/2013   Medication management 06/10/2013   GERD    Hyperlipidemia    Abnormal glucose    Hypertension       Current Outpatient Medications (Cardiovascular):    metoprolol succinate (TOPROL XL) 25 MG 24 hr tablet, Take 1 tablet (25 mg total) by mouth at bedtime.     Current  Outpatient Medications (Other):    ALPRAZolam (XANAX) 0.25 MG tablet, Take 1/2 to 1 tablet 3 x daily as need for anxiety   ergocalciferol (VITAMIN D2) 1.25 MG (50000 UT) capsule, Take 50,000 Units by mouth once a week.  Allergies  Allergen Reactions   Ampicillin Hives and Swelling   Aspirin Itching   Doxycycline Itching and Swelling   Macrodantin [Nitrofurantoin Macrocrystal] Hives, Itching and Swelling   Percocet [Oxycodone-Acetaminophen]    Propulsid [Cisapride] Itching and Swelling   Voltaren [Diclofenac] Hives and Itching   Codeine Palpitations   Penicillins Swelling and Rash    ROS: all  negative except above.   Physical Exam: Filed Weights   09/02/19 1510  Weight: 156 lb (70.8 kg)   BP 130/70    Pulse 87    Temp 97.7 F (36.5 C)    Ht 5' 4"  (1.626 m)    Wt 156 lb (70.8 kg)    SpO2 97%    BMI 26.78 kg/m  General Appearance: Well nourished, in no apparent distress. Eyes: PERRLA, EOMs, conjunctiva no swelling or erythema Sinuses: No Frontal/maxillary tenderness ENT/Mouth: Ext aud canals clear, TMs without erythema, bulging. No erythema, swelling, or exudate on post pharynx.  Tonsils not swollen or erythematous. Hearing normal.  Neck: Supple, thyroid normal.  Respiratory: Respiratory effort normal, BS equal bilaterally without rales, rhonchi, wheezing or stridor.  Cardio: RRR with no MRGs. Brisk peripheral pulses without edema.  Abdomen: Soft, + BS.  Non tender, no guarding, rebound, hernias, masses. Lymphatics: Non tender without lymphadenopathy.  Musculoskeletal: Full ROM, 5/5 strength, normal gait.  Skin: Warm, dry without rashes, lesions, ecchymosis.  Neuro: Cranial nerves intact. Normal muscle tone, no cerebellar symptoms. Sensation intact.  Psych: Awake and oriented X 3, normal affect, Insight and Judgment appropriate.     Vicie Mutters, PA-C 3:19 PM [facility]

## 2019-09-02 NOTE — Patient Instructions (Addendum)
With your cholesterol, preDM and your potential issues I suggest getting a carotid ultrasound.   YOU CAN CALL TO MAKE AN ULTRASOUND..  I have put in an order for an ultrasound for you to have You can set them up at your convenience by calling this number [phone] You will likely have the ultrasound at [place] [address]  If you have any issues call our office and we will set this up for you.      Due to your cholesterol we can set this up for you if you are interested, will discuss next OV.  I'm going to place an order for a cardiac calcium score, this will help [place] quantify how much calcification if any you have in your coronary arteries and can help [place] decide if we need to do aggressive medical management like cholesterol medication.  It will be done at [facility] on [address] in [place] It will be 99 dollars self pay.  You can call [phone] to schedule this, just given them your name and date of birth.   Let [place] know if you have any questions, Estill Bamberg

## 2019-09-03 ENCOUNTER — Encounter: Payer: Self-pay | Admitting: *Deleted

## 2019-09-03 LAB — COMPLETE METABOLIC PANEL WITH GFR
AG Ratio: 1.6 (calc) (ref 1.0–2.5)
ALT: 43 U/L — ABNORMAL HIGH (ref 6–29)
AST: 55 U/L — ABNORMAL HIGH (ref 10–35)
Albumin: 5 g/dL (ref 3.6–5.1)
Alkaline phosphatase (APISO): 68 U/L (ref 37–153)
BUN: 11 mg/dL (ref 7–25)
CO2: 30 mmol/L (ref 20–32)
Calcium: 9.9 mg/dL (ref 8.6–10.4)
Chloride: 101 mmol/L (ref 98–110)
Creat: 0.74 mg/dL (ref 0.60–0.93)
GFR, Est African American: 93 mL/min/{1.73_m2} (ref >=60)
GFR, Est Non African American: 80 mL/min/{1.73_m2} (ref >=60)
Globulin: 3.1 g/dL (calc) (ref 1.9–3.7)
Glucose, Bld: 86 mg/dL (ref 65–99)
Potassium: 4.4 mmol/L (ref 3.5–5.3)
Sodium: 138 mmol/L (ref 135–146)
Total Bilirubin: 0.8 mg/dL (ref 0.2–1.2)
Total Protein: 8.1 g/dL (ref 6.1–8.1)

## 2019-09-03 LAB — CBC WITH DIFFERENTIAL/PLATELET
Absolute Monocytes: 816 cells/uL (ref 200–950)
Basophils Absolute: 48 cells/uL (ref 0–200)
Basophils Relative: 0.5 %
Eosinophils Absolute: 230 cells/uL (ref 15–500)
Eosinophils Relative: 2.4 %
HCT: 41.1 % (ref 35.0–45.0)
Hemoglobin: 13.8 g/dL (ref 11.7–15.5)
Lymphs Abs: 2362 cells/uL (ref 850–3900)
MCH: 30.4 pg (ref 27.0–33.0)
MCHC: 33.6 g/dL (ref 32.0–36.0)
MCV: 90.5 fL (ref 80.0–100.0)
MPV: 10.3 fL (ref 7.5–12.5)
Monocytes Relative: 8.5 %
Neutro Abs: 6144 cells/uL (ref 1500–7800)
Neutrophils Relative %: 64 %
Platelets: 245 10*3/uL (ref 140–400)
RBC: 4.54 10*6/uL (ref 3.80–5.10)
RDW: 12.5 % (ref 11.0–15.0)
Total Lymphocyte: 24.6 %
WBC: 9.6 10*3/uL (ref 3.8–10.8)

## 2019-09-03 LAB — HEMOGLOBIN A1C
Hgb A1c MFr Bld: 5.5 % of total Hgb (ref <5.7)
Mean Plasma Glucose: 111 (calc)
eAG (mmol/L): 6.2 (calc)

## 2019-09-03 LAB — LIPID PANEL
Cholesterol: 237 mg/dL — ABNORMAL HIGH (ref <200)
HDL: 35 mg/dL — ABNORMAL LOW (ref >=50)
LDL Cholesterol (Calc): 165 mg/dL (calc) — ABNORMAL HIGH
Non-HDL Cholesterol (Calc): 202 mg/dL (calc) — ABNORMAL HIGH (ref <130)
Total CHOL/HDL Ratio: 6.8 (calc) — ABNORMAL HIGH (ref <5.0)
Triglycerides: 208 mg/dL — ABNORMAL HIGH (ref <150)

## 2019-09-03 LAB — TSH: TSH: 2.57 mIU/L (ref 0.40–4.50)

## 2019-09-03 LAB — FOLATE RBC: RBC Folate: 580 ng/mL RBC (ref >280)

## 2019-09-15 ENCOUNTER — Encounter: Payer: Self-pay | Admitting: Diagnostic Neuroimaging

## 2019-09-15 ENCOUNTER — Ambulatory Visit: Payer: Medicare PPO | Admitting: Diagnostic Neuroimaging

## 2019-09-15 VITALS — BP 181/88 | HR 89 | Ht 64.0 in | Wt 159.2 lb

## 2019-09-15 DIAGNOSIS — R413 Other amnesia: Secondary | ICD-10-CM

## 2019-09-15 DIAGNOSIS — G250 Essential tremor: Secondary | ICD-10-CM

## 2019-09-15 DIAGNOSIS — H539 Unspecified visual disturbance: Secondary | ICD-10-CM | POA: Diagnosis not present

## 2019-09-15 NOTE — Patient Instructions (Signed)
TRANSIENT VISION LOSS  - check MRI brain, carotid u/s, echocardiogram (patient wants to think about it; will let us know)  MEMORY LOSS - check MRI brain, B12 level (patient wants to think about it; will let us know)  ESSENTIAL TREMOR - monitor; consider metoprolol or primidone if symptoms worsen

## 2019-09-15 NOTE — Progress Notes (Signed)
[facility]  PATIENT: Margaret Sullivan DOB: 12-12-1945  REFERRING CLINICIAN: Unk Pinto, MD HISTORY FROM: patient  REASON FOR VISIT: new consult    HISTORICAL  CHIEF COMPLAINT:  Chief Complaint  Patient presents with  . Memory changes    rm 6 New Pt  MMSE 29  . Amaurosis fugax L eye    "upcoming apt with eye dr"  . Tremors    "increase in tremors particularly on L side-arm/hand; saw Dr Erling Cruz previously"    HISTORY OF PRESENT ILLNESS:   74 year old female here for evaluation of transient visual loss, memory loss, tremor.  May 2021 patient had transient visual loss, but cannot tell if it was this was on the right or left side.  Symptoms lasted about 10 minutes.  She felt like a veil, over her vision.  This was not total visual loss but partial.  No other associated symptoms.  Patient also has had some mild intermittent memory loss problems.  She has had more stress lately.  She is managing anxiety symptoms.  Also has had long history of postural tremor, previously diagnosed essential tremor.    REVIEW OF SYSTEMS: Full 14 system review of systems performed and negative with exception of: As per HPI.  ALLERGIES: Allergies  Allergen Reactions  . Ampicillin Hives and Swelling  . Aspirin Itching  . Doxycycline Itching and Swelling  . Macrodantin [Nitrofurantoin Macrocrystal] Hives, Itching and Swelling  . Percocet [Oxycodone-Acetaminophen]   . Propulsid [Cisapride] Itching and Swelling  . Voltaren [Diclofenac] Hives and Itching  . Codeine Palpitations  . Penicillins Swelling and Rash    HOME MEDICATIONS: Outpatient Medications Prior to Visit  Medication Sig Dispense Refill  . Cholecalciferol (D3 PO) Take 5,000 Units by mouth daily.    Marland Kitchen ALPRAZolam (XANAX) 0.25 MG tablet Take 1/2 to 1 tablet 3 x daily as need for anxiety (Patient not taking: Reported on 09/15/2019) 60 tablet 0  . metoprolol succinate (TOPROL XL) 25 MG 24 hr tablet Take 1 tablet  (25 mg total) by mouth at bedtime. (Patient not taking: Reported on 09/15/2019) 30 tablet 11  . ergocalciferol (VITAMIN D2) 1.25 MG (50000 UT) capsule Take 50,000 Units by mouth once a week.     No facility-administered medications prior to visit.    PAST MEDICAL HISTORY: Past Medical History:  Diagnosis Date  . Allergy   . Essential tremor   . GERD (gastroesophageal reflux disease)   . Heart murmur   . High cholesterol   . Hyperlipidemia   . Hypertension    labile  . Labile hypertension   . Prediabetes     PAST SURGICAL HISTORY: Past Surgical History:  Procedure Laterality Date  . BACK SURGERY  2001  . COLONOSCOPY    . TONSILLECTOMY AND ADENOIDECTOMY Bilateral 1950  . UPPER GASTROINTESTINAL ENDOSCOPY    . WISDOM TOOTH EXTRACTION Bilateral 1969    FAMILY HISTORY: Family History  Problem Relation Age of Onset  . Lymphoma Mother        NHL  . Heart attack Father   . Breast cancer Father   . Colon cancer Maternal Uncle   . Esophageal cancer Neg Hx   . Stomach cancer Neg Hx   . Rectal cancer Neg Hx     SOCIAL HISTORY: Social History   Socioeconomic History  . Marital status: Divorced    Spouse name: Not on file  . Number of children: 0  . Years of education: Not on file  . Highest education  level: Master's degree (e.g., MA, MS, MEng, MEd, MSW, MBA)  Occupational History    Comment: retired Pharmacist, hospital  Tobacco Use  . Smoking status: Never Smoker  . Smokeless tobacco: Never Used  Vaping Use  . Vaping Use: Never used  Substance and Sexual Activity  . Alcohol use: Never  . Drug use: Never  . Sexual activity: Not on file  Other Topics Concern  . Not on file  Social History Narrative   Lives with brother   Caffeine- 8-10 oz daily   Social Determinants of Health   Financial Resource Strain:   . Difficulty of Paying Living Expenses:   Food Insecurity:   . Worried About Charity fundraiser in the Last Year:   . Arboriculturist in the Last Year:    Transportation Needs:   . Film/video editor (Medical):   Marland Kitchen Lack of Transportation (Non-Medical):   Physical Activity:   . Days of Exercise per Week:   . Minutes of Exercise per Session:   Stress:   . Feeling of Stress :   Social Connections:   . Frequency of Communication with Friends and Family:   . Frequency of Social Gatherings with Friends and Family:   . Attends Religious Services:   . Active Member of Clubs or Organizations:   . Attends Archivist Meetings:   Marland Kitchen Marital Status:   Intimate Partner Violence:   . Fear of Current or Ex-Partner:   . Emotionally Abused:   Marland Kitchen Physically Abused:   . Sexually Abused:      PHYSICAL EXAM  GENERAL EXAM/CONSTITUTIONAL: Vitals:  Vitals:   09/15/19 1445  BP: (!) 181/88  Pulse: 89  Weight: 159 lb 3.2 oz (72.2 kg)  Height: 5' 4"  (1.626 m)     Body mass index is 27.33 kg/m. Wt Readings from Last 3 Encounters:  09/15/19 159 lb 3.2 oz (72.2 kg)  09/02/19 156 lb (70.8 kg)  07/15/19 153 lb 3.2 oz (69.5 kg)     Patient is in no distress; well developed, nourished and groomed; neck is supple  CARDIOVASCULAR:  Examination of carotid arteries is normal; no carotid bruits  Regular rate and rhythm, no murmurs  Examination of peripheral vascular system by observation and palpation is normal  EYES:  Ophthalmoscopic exam of optic discs and posterior segments is normal; no papilledema or hemorrhages  No exam data present  MUSCULOSKELETAL:  Gait, strength, tone, movements noted in Neurologic exam below  NEUROLOGIC: MENTAL STATUS:  MMSE - Mini Mental State Exam 09/15/2019  Orientation to time 5  Orientation to Place 5  Registration 3  Attention/ Calculation 5  Recall 3  Language- name 2 objects 2  Language- repeat 1  Language- follow 3 step command 3  Language- read & follow direction 1  Write a sentence 0  Write a sentence-comments no subject  Copy design 1  Total score 29    awake, alert,  oriented to person, place and time  recent and remote memory intact  normal attention and concentration  language fluent, comprehension intact, naming intact  fund of knowledge appropriate  CRANIAL NERVE:   2nd - no papilledema on fundoscopic exam  2nd, 3rd, 4th, 6th - pupils equal and reactive to light, visual fields full to confrontation, extraocular muscles intact, no nystagmus  5th - facial sensation symmetric  7th - facial strength symmetric  8th - hearing intact  9th - palate elevates symmetrically, uvula midline  11th - shoulder shrug symmetric  12th - tongue protrusion midline  MOTOR:   normal bulk and tone, full strength in the BUE, BLE  SENSORY:   normal and symmetric to light touch, temperature, vibration  COORDINATION:   finger-nose-finger, fine finger movements normal  REFLEXES:   deep tendon reflexes present and symmetric  GAIT/STATION:   narrow based gait     DIAGNOSTIC DATA (LABS, IMAGING, TESTING) - I reviewed patient records, labs, notes, testing and imaging myself where available.  Lab Results  Component Value Date   WBC 9.6 09/02/2019   HGB 13.8 09/02/2019   HCT 41.1 09/02/2019   MCV 90.5 09/02/2019   PLT 245 09/02/2019      Component Value Date/Time   NA 138 09/02/2019 1551   K 4.4 09/02/2019 1551   CL 101 09/02/2019 1551   CO2 30 09/02/2019 1551   GLUCOSE 86 09/02/2019 1551   BUN 11 09/02/2019 1551   CREATININE 0.74 09/02/2019 1551   CALCIUM 9.9 09/02/2019 1551   PROT 8.1 09/02/2019 1551   ALBUMIN 4.9 04/08/2019 1152   AST 55 (H) 09/02/2019 1551   ALT 43 (H) 09/02/2019 1551   ALKPHOS 75 04/08/2019 1152   BILITOT 0.8 09/02/2019 1551   GFRNONAA 80 09/02/2019 1551   GFRAA 93 09/02/2019 1551   Lab Results  Component Value Date   CHOL 237 (H) 09/02/2019   HDL 35 (L) 09/02/2019   LDLCALC 165 (H) 09/02/2019   TRIG 208 (H) 09/02/2019   CHOLHDL 6.8 (H) 09/02/2019   Lab Results  Component Value Date   HGBA1C 5.5  09/02/2019   Lab Results  Component Value Date   VITAMINB12 462 02/09/2019   Lab Results  Component Value Date   TSH 2.57 09/02/2019       ASSESSMENT AND PLAN  74 y.o. year old female here with transient visual disturbance, memory loss, essential tremor.  Reviewed symptoms and neuro exam with patient.  Recommend additional evaluation but patient wants to think about it before proceeding.  Dx:  1. Transient vision disturbance   2. Memory loss   3. Essential tremor     PLAN:  TRANSIENT VISION LOSS  - check MRI brain, carotid u/s, echocardiogram (patient wants to think about it; will let us know)  MEMORY LOSS (MMSE 29/30, no change in ADLs, some anxiety / stress issues) - check MRI brain, B12 level (patient wants to think about it; will let us know)  ESSENTIAL TREMOR - monitor; consider metoprolol or primidone if symptoms worsen  HYPERTENSION - follow up with PCP  Return for pending if symptoms worsen or fail to improve, return to PCP.    Penni Bombard, MD 3/81/8299, 3:71 PM Certified in Neurology, Neurophysiology and Neuroimaging  [facility] [address], [place],  [address] [phone]

## 2019-09-24 ENCOUNTER — Ambulatory Visit (HOSPITAL_COMMUNITY): Payer: Medicare PPO

## 2019-10-01 ENCOUNTER — Encounter: Payer: Self-pay | Admitting: Gastroenterology

## 2019-10-01 ENCOUNTER — Ambulatory Visit: Payer: Medicare PPO | Admitting: Gastroenterology

## 2019-10-01 VITALS — BP 160/80 | HR 71 | Ht 64.0 in | Wt 156.0 lb

## 2019-10-01 DIAGNOSIS — Z86010 Personal history of colonic polyps: Secondary | ICD-10-CM

## 2019-10-01 DIAGNOSIS — E7800 Pure hypercholesterolemia, unspecified: Secondary | ICD-10-CM | POA: Diagnosis not present

## 2019-10-01 DIAGNOSIS — K2100 Gastro-esophageal reflux disease with esophagitis, without bleeding: Secondary | ICD-10-CM | POA: Diagnosis not present

## 2019-10-01 DIAGNOSIS — K760 Fatty (change of) liver, not elsewhere classified: Secondary | ICD-10-CM

## 2019-10-01 DIAGNOSIS — K7581 Nonalcoholic steatohepatitis (NASH): Secondary | ICD-10-CM | POA: Diagnosis not present

## 2019-10-01 MED ORDER — FAMOTIDINE 20 MG PO TABS
20.0000 mg | ORAL_TABLET | Freq: Every day | ORAL | 3 refills | Status: DC
Start: 2019-10-01 — End: 2020-07-19

## 2019-10-01 NOTE — Progress Notes (Signed)
Margaret Sullivan    [id number]    10-25-1945  Primary Care Physician:McKeown, Gwyndolyn Saxon, MD  Referring Physician: Unk Pinto, MD [address] [place],  [place] [address]   Chief complaint:  Fatty Liver  HPI:  74 year old very pleasant female here for follow-up for fatty liver/[place]  She is continuing to watch her diet, trying to avoid sweets.  Hepatic Function Latest Ref Rng & Units 09/02/2019 07/15/2019 04/08/2019  Total Protein 6.1 - 8.1 g/dL 8.1 8.2(H) 8.3  Albumin 3.5 - 5.2 g/dL - - 4.9  AST 10 - 35 U/L 55(H) 34 48(H)  ALT 6 - 29 U/L 43(H) 27 42(H)  Alk Phosphatase 39 - 117 U/L - - 75  Total Bilirubin 0.2 - 1.2 mg/dL 0.8 0.8 0.7  Bilirubin, Direct 0.0 - 0.2 mg/dL - - -   Hyperlipidemia with elevated LDL and cholesterol. Hesitant to start statin due to side effect profile. She is trying to modify her diet  Colonoscopy Jul 25, 2006: 3 mm sessile hyperplastic polyp removed from sigmoid colon, left-sided diverticulosis and internal and external hemorrhoids.  Colonoscopy May 21, 2019: Multiple polyps removed, largest 20 mm in size from transverse colon. All were tubular adenomas except for one hyperplastic polyp removed from rectum  EGD May 21, 2019: Hiatal hernia, erosive esophagitis and gastritis. Gastric biopsies negative for H. Pylori.  Abdominal ultrasound with elastography April 13, 2019: Heterogeneous liver with fatty infiltration. Stable small nodule right lobe. Cholelithiasis with 22 mm calcified shadowing gallstone. No significant fibrosis with less than 5 kPa  Outpatient Encounter Medications as of 10/01/2019  Medication Sig  . ALPRAZolam (XANAX) 0.25 MG tablet Take 1/2 to 1 tablet 3 x daily as need for anxiety  . Cholecalciferol (D3 PO) Take 5,000 Units by mouth daily.  . metoprolol succinate (TOPROL XL) 25 MG 24 hr tablet Take 1 tablet (25 mg total) by mouth at bedtime.   No facility-administered encounter medications on file  as of 10/01/2019.    Allergies as of 10/01/2019 - Review Complete 10/01/2019  Allergen Reaction Noted  . Ampicillin Hives and Swelling 06/09/2013  . Aspirin Itching 06/09/2013  . Doxycycline Itching and Swelling 06/09/2013  . Macrodantin [nitrofurantoin macrocrystal] Hives, Itching, and Swelling 06/09/2013  . Percocet [oxycodone-acetaminophen]  07/01/2013  . Propulsid [cisapride] Itching and Swelling 06/09/2013  . Voltaren [diclofenac] Hives and Itching 06/09/2013  . Codeine Palpitations 06/09/2013  . Penicillins Swelling and Rash 06/09/2013    Past Medical History:  Diagnosis Date  . Allergy   . Essential tremor   . GERD (gastroesophageal reflux disease)   . Heart murmur   . High cholesterol   . Hyperlipidemia   . Hypertension    labile  . Labile hypertension   . Prediabetes     Past Surgical History:  Procedure Laterality Date  . BACK SURGERY  2001  . COLONOSCOPY    . TONSILLECTOMY AND ADENOIDECTOMY Bilateral 1950  . UPPER GASTROINTESTINAL ENDOSCOPY    . WISDOM TOOTH EXTRACTION Bilateral 1969    Family History  Problem Relation Age of Onset  . Lymphoma Mother        NHL  . Heart attack Father   . Breast cancer Father   . Colon cancer Maternal Uncle   . Esophageal cancer Neg Hx   . Stomach cancer Neg Hx   . Rectal cancer Neg Hx     Social History   Socioeconomic History  . Marital status: Divorced  Spouse name: Not on file  . Number of children: 0  . Years of education: Not on file  . Highest education level: Master's degree (e.g., MA, MS, MEng, MEd, MSW, MBA)  Occupational History    Comment: retired Pharmacist, hospital  Tobacco Use  . Smoking status: Never Smoker  . Smokeless tobacco: Never Used  Vaping Use  . Vaping Use: Never used  Substance and Sexual Activity  . Alcohol use: Never  . Drug use: Never  . Sexual activity: Not on file  Other Topics Concern  . Not on file  Social History Narrative   Lives with brother   Caffeine- 8-10 oz daily    Social Determinants of Health   Financial Resource Strain:   . Difficulty of Paying Living Expenses:   Food Insecurity:   . Worried About Charity fundraiser in the Last Year:   . Arboriculturist in the Last Year:   Transportation Needs:   . Film/video editor (Medical):   Marland Kitchen Lack of Transportation (Non-Medical):   Physical Activity:   . Days of Exercise per Week:   . Minutes of Exercise per Session:   Stress:   . Feeling of Stress :   Social Connections:   . Frequency of Communication with Friends and Family:   . Frequency of Social Gatherings with Friends and Family:   . Attends Religious Services:   . Active Member of Clubs or Organizations:   . Attends Archivist Meetings:   Marland Kitchen Marital Status:   Intimate Partner Violence:   . Fear of Current or Ex-Partner:   . Emotionally Abused:   Marland Kitchen Physically Abused:   . Sexually Abused:       Review of systems:  All other review of systems negative except as mentioned in the HPI.   Physical Exam: Vitals:   10/01/19 1039  BP: (!) 160/80  Pulse: 71   Body mass index is 26.78 kg/m. Gen:      No acute distress HEENT:  sclera anicteric Abd:      soft, non-tender; no palpable masses, no distension Ext:    No edema Neuro: alert and oriented x 3 Psych: normal mood and affect  Data Reviewed:  Reviewed labs, radiology imaging, old records and pertinent past GI work up   Assessment and Plan/Recommendations:  74 year old very pleasant female with history of hyperlipidemia, fatty liver secondary to [employer]: Discussed low carb low-fat diet and exercise She does not drink alcohol, advised patient to continue to avoid alcohol or any over-the-counter herbal medications or any NSAIDs that could be potentially hepatotoxic  LFT remained relatively stable Advised patient to discuss treatment for hyperlipidemia with PMD as elevated cholesterol is likely primary etiology for fatty liver and statins are not  contraindicated in this scenario  GERD: Antireflux measures Use Pepcid 20 mg daily at bedtime as needed  Multiple advanced adenomatous colon polyps: Due for surveillance colonoscopy March 2024  Return in 6 months or sooner if needed  The patient was provided an opportunity to ask questions and all were answered. The patient agreed with the plan and demonstrated an understanding of the instructions.  Damaris Hippo , MD    CC: Unk Pinto, MD

## 2019-10-01 NOTE — Patient Instructions (Signed)
We have sent Pepcid to your pharmacy  Follow a low calorie and low carb diet    Gastroesophageal Reflux Disease, Adult Gastroesophageal reflux (GER) happens when acid from the stomach flows up into the tube that connects the mouth and the stomach (esophagus). Normally, food travels down the esophagus and stays in the stomach to be digested. However, when a person has GER, food and stomach acid sometimes move back up into the esophagus. If this becomes a more serious problem, the person may be diagnosed with a disease called gastroesophageal reflux disease (GERD). GERD occurs when the reflux:  Happens often.  Causes frequent or severe symptoms.  Causes problems such as damage to the esophagus. When stomach acid comes in contact with the esophagus, the acid may cause soreness (inflammation) in the esophagus. Over time, GERD may create small holes (ulcers) in the lining of the esophagus. What are the causes? This condition is caused by a problem with the muscle between the esophagus and the stomach (lower esophageal sphincter, or LES). Normally, the LES muscle closes after food passes through the esophagus to the stomach. When the LES is weakened or abnormal, it does not close properly, and that allows food and stomach acid to go back up into the esophagus. The LES can be weakened by certain dietary substances, medicines, and medical conditions, including:  Tobacco use.  Pregnancy.  Having a hiatal hernia.  Alcohol use.  Certain foods and beverages, such as coffee, chocolate, onions, and peppermint. What increases the risk? You are more likely to develop this condition if you:  Have an increased body weight.  Have a connective tissue disorder.  Use NSAID medicines. What are the signs or symptoms? Symptoms of this condition include:  Heartburn.  Difficult or painful swallowing.  The feeling of having a lump in the throat.  Abitter taste in the mouth.  Bad breath.  Having  a large amount of saliva.  Having an upset or bloated stomach.  Belching.  Chest pain. Different conditions can cause chest pain. Make sure you see your health care provider if you experience chest pain.  Shortness of breath or wheezing.  Ongoing (chronic) cough or a night-time cough.  Wearing away of tooth enamel.  Weight loss. How is this diagnosed? Your health care provider will take a medical history and perform a physical exam. To determine if you have mild or severe GERD, your health care provider may also monitor how you respond to treatment. You may also have tests, including:  A test to examine your stomach and esophagus with a small camera (endoscopy).  A test thatmeasures the acidity level in your esophagus.  A test thatmeasures how much pressure is on your esophagus.  A barium swallow or modified barium swallow test to show the shape, size, and functioning of your esophagus. How is this treated? The goal of treatment is to help relieve your symptoms and to prevent complications. Treatment for this condition may vary depending on how severe your symptoms are. Your health care provider may recommend:  Changes to your diet.  Medicine.  Surgery. Follow these instructions at home: Eating and drinking   Follow a diet as recommended by your health care provider. This may involve avoiding foods and drinks such as: ? Coffee and tea (with or without caffeine). ? Drinks that containalcohol. ? Energy drinks and sports drinks. ? Carbonated drinks or sodas. ? Chocolate and cocoa. ? Peppermint and mint flavorings. ? Garlic and onions. ? Horseradish. ? Spicy and acidic  foods, including peppers, chili powder, curry powder, vinegar, hot sauces, and barbecue sauce. ? Citrus fruit juices and citrus fruits, such as oranges, lemons, and limes. ? Tomato-based foods, such as red sauce, chili, salsa, and pizza with red sauce. ? Fried and fatty foods, such as donuts, french  fries, potato chips, and high-fat dressings. ? High-fat meats, such as hot dogs and fatty cuts of red and white meats, such as rib eye steak, sausage, ham, and bacon. ? High-fat dairy items, such as whole milk, butter, and cream cheese.  Eat small, frequent meals instead of large meals.  Avoid drinking large amounts of liquid with your meals.  Avoid eating meals during the 2-3 hours before bedtime.  Avoid lying down right after you eat.  Do not exercise right after you eat. Lifestyle   Do not use any products that contain nicotine or tobacco, such as cigarettes, e-cigarettes, and chewing tobacco. If you need help quitting, ask your health care provider.  Try to reduce your stress by using methods such as yoga or meditation. If you need help reducing stress, ask your health care provider.  If you are overweight, reduce your weight to an amount that is healthy for you. Ask your health care provider for guidance about a safe weight loss goal. General instructions  Pay attention to any changes in your symptoms.  Take over-the-counter and prescription medicines only as told by your health care provider. Do not take aspirin, ibuprofen, or other NSAIDs unless your health care provider told you to do so.  Wear loose-fitting clothing. Do not wear anything tight around your waist that causes pressure on your abdomen.  Raise (elevate) the head of your bed about 6 inches (15 cm).  Avoid bending over if this makes your symptoms worse.  Keep all follow-up visits as told by your health care provider. This is important. Contact a health care provider if:  You have: ? New symptoms. ? Unexplained weight loss. ? Difficulty swallowing or it hurts to swallow. ? Wheezing or a persistent cough. ? A hoarse voice.  Your symptoms do not improve with treatment. Get help right away if you:  Have pain in your arms, neck, jaw, teeth, or back.  Feel sweaty, dizzy, or light-headed.  Have chest pain  or shortness of breath.  Vomit and your vomit looks like blood or coffee grounds.  Faint.  Have stool that is bloody or black.  Cannot swallow, drink, or eat. Summary  Gastroesophageal reflux happens when acid from the stomach flows up into the esophagus. GERD is a disease in which the reflux happens often, causes frequent or severe symptoms, or causes problems such as damage to the esophagus.  Treatment for this condition may vary depending on how severe your symptoms are. Your health care provider may recommend diet and lifestyle changes, medicine, or surgery.  Contact a health care provider if you have new or worsening symptoms.  Take over-the-counter and prescription medicines only as told by your health care provider. Do not take aspirin, ibuprofen, or other NSAIDs unless your health care provider told you to do so.  Keep all follow-up visits as told by your health care provider. This is important. This information is not intended to replace advice given to you by your health care provider. Make sure you discuss any questions you have with your health care provider. Document Revised: 08/28/2017 Document Reviewed: 08/28/2017 Elsevier Patient Education  2020 [place] for Gastroesophageal Reflux Disease, Adult When you  have gastroesophageal reflux disease (GERD), the foods you eat and your eating habits are very important. Choosing the right foods can help ease your discomfort. Think about working with a nutrition specialist (dietitian) to help you make good choices. What are tips for following this plan?  Meals  Choose healthy foods that are low in fat, such as fruits, vegetables, whole grains, low-fat dairy products, and lean meat, fish, and poultry.  Eat small meals often instead of 3 large meals a day. Eat your meals slowly, and in a place where you are relaxed. Avoid bending over or lying down until 2-3 hours after eating.  Avoid eating meals 2-3 hours  before bed.  Avoid drinking a lot of liquid with meals.  Cook foods using methods other than frying. Bake, grill, or broil food instead.  Avoid or limit: ? Chocolate. ? Peppermint or spearmint. ? Alcohol. ? Pepper. ? Black and decaffeinated coffee. ? Black and decaffeinated tea. ? Bubbly (carbonated) soft drinks. ? Caffeinated energy drinks and soft drinks.  Limit high-fat foods such as: ? Fatty meat or fried foods. ? Whole milk, cream, butter, or ice cream. ? Nuts and nut butters. ? Pastries, donuts, and sweets made with butter or shortening.  Avoid foods that cause symptoms. These foods may be different for everyone. Common foods that cause symptoms include: ? Tomatoes. ? Oranges, lemons, and limes. ? Peppers. ? Spicy food. ? Onions and garlic. ? Vinegar. Lifestyle  Maintain a healthy weight. Ask your doctor what weight is healthy for you. If you need to lose weight, work with your doctor to do so safely.  Exercise for at least 30 minutes for 5 or more days each week, or as told by your doctor.  Wear loose-fitting clothes.  Do not smoke. If you need help quitting, ask your doctor.  Sleep with the head of your bed higher than your feet. Use a wedge under the mattress or blocks under the bed frame to raise the head of the bed. Summary  When you have gastroesophageal reflux disease (GERD), food and lifestyle choices are very important in easing your symptoms.  Eat small meals often instead of 3 large meals a day. Eat your meals slowly, and in a place where you are relaxed.  Limit high-fat foods such as fatty meat or fried foods.  Avoid bending over or lying down until 2-3 hours after eating.  Avoid peppermint and spearmint, caffeine, alcohol, and chocolate. This information is not intended to replace advice given to you by your health care provider. Make sure you discuss any questions you have with your health care provider. Document Revised: 06/12/2018 Document  Reviewed: 03/27/2016 Elsevier Patient Education  [place].  I appreciate the  opportunity to care for you  Thank You   Harl Bowie , MD

## 2019-10-23 DIAGNOSIS — H10412 Chronic giant papillary conjunctivitis, left eye: Secondary | ICD-10-CM | POA: Diagnosis not present

## 2019-11-17 DIAGNOSIS — H10413 Chronic giant papillary conjunctivitis, bilateral: Secondary | ICD-10-CM | POA: Diagnosis not present

## 2019-11-17 DIAGNOSIS — H2513 Age-related nuclear cataract, bilateral: Secondary | ICD-10-CM | POA: Diagnosis not present

## 2019-11-17 DIAGNOSIS — H5213 Myopia, bilateral: Secondary | ICD-10-CM | POA: Diagnosis not present

## 2019-11-17 DIAGNOSIS — H52203 Unspecified astigmatism, bilateral: Secondary | ICD-10-CM | POA: Diagnosis not present

## 2019-11-24 ENCOUNTER — Inpatient Hospital Stay (HOSPITAL_COMMUNITY): Admission: RE | Admit: 2019-11-24 | Payer: Medicare PPO | Source: Ambulatory Visit

## 2019-12-15 ENCOUNTER — Other Ambulatory Visit: Payer: Self-pay

## 2019-12-15 ENCOUNTER — Encounter: Payer: Self-pay | Admitting: Adult Health Nurse Practitioner

## 2019-12-15 ENCOUNTER — Ambulatory Visit: Payer: Medicare PPO | Admitting: Adult Health Nurse Practitioner

## 2019-12-15 VITALS — BP 134/82 | HR 59 | Temp 97.4°F | Wt 157.0 lb

## 2019-12-15 DIAGNOSIS — K117 Disturbances of salivary secretion: Secondary | ICD-10-CM | POA: Diagnosis not present

## 2019-12-15 DIAGNOSIS — Z79899 Other long term (current) drug therapy: Secondary | ICD-10-CM

## 2019-12-15 DIAGNOSIS — Z8669 Personal history of other diseases of the nervous system and sense organs: Secondary | ICD-10-CM | POA: Diagnosis not present

## 2019-12-15 DIAGNOSIS — K047 Periapical abscess without sinus: Secondary | ICD-10-CM | POA: Diagnosis not present

## 2019-12-15 DIAGNOSIS — H02402 Unspecified ptosis of left eyelid: Secondary | ICD-10-CM

## 2019-12-15 DIAGNOSIS — F419 Anxiety disorder, unspecified: Secondary | ICD-10-CM

## 2019-12-15 DIAGNOSIS — R0989 Other specified symptoms and signs involving the circulatory and respiratory systems: Secondary | ICD-10-CM

## 2019-12-15 DIAGNOSIS — G250 Essential tremor: Secondary | ICD-10-CM

## 2019-12-15 DIAGNOSIS — Z9119 Patient's noncompliance with other medical treatment and regimen: Secondary | ICD-10-CM

## 2019-12-15 DIAGNOSIS — G510 Bell's palsy: Secondary | ICD-10-CM

## 2019-12-15 DIAGNOSIS — Z91199 Patient's noncompliance with other medical treatment and regimen due to unspecified reason: Secondary | ICD-10-CM

## 2019-12-15 NOTE — Patient Instructions (Signed)
  Be sure you are drinking 64-80oz of water a day.  This evening please take alprazolam half tablet 30-57mn prior to going to sleep.   IF your blood pressure is 160/80 or higher, take Metoprolol 277mtablet.  This is a low dose.  Please monitor your symptoms as I am concerned that you could have a flare of Bells Palsy, this would require prednisone to help with this.

## 2019-12-15 NOTE — Progress Notes (Signed)
Assessment and Plan:  Margaret Sullivan was seen today for acute visit.  Diagnoses and all orders for this visit:  Labile hypertension Patient is NOT taking medication  Discussed this at length with patient Discussed monitor blood pressure BID and record. -     CBC with Differential/Platelet -     COMPLETE METABOLIC PANEL WITH GFR  Ptosis of left eyelid Salivation increase History of Bell's palsy Concern for increasing symptoms of bell's palsy flare Discussed treatment with prednisone taper and acyclovir Patient hesitant to this. Will review labs first Provided printed information and discussed at length with patient.  Anxiety Discussed taking PRN medication as prescribed Has alprazolam 0.6m, half to one tablet Encouraged patient to take whole tablet  Essential tremor Doing well at this time Not bothersome  Poor compliance with medical recommendations Discussed medications and compliance/hesitenacy  Abscessed tooth Has appointment in two weeks for this and preventative antibiotics prior to procedure.  Medication management Continued    Further disposition pending results of labs. Discussed med's effects and SE's.   Over 30 minutes of face to face interview, exam, counseling, chart review, and critical decision making was performed.   Future Appointments  Date Time Provider DCherry Grove 02/09/2020  3:00 PM MGarnet Sierras NP [facility] None    ------------------------------------------------------------------------------------------------------------------   HPI Margaret Sullivan presents for evaluation of jaw and eye pain  Reports she has an abscess tooth.  She had it scheduled to be removed on 11/13/19 but it was rescheduled to 12/30/19.  Oral Surgeon will removed this tooth as the root calcified and mild infection in the area. She reports that last week she started having itchy watery eyes with tenderness in her left nasal cavity.  She also had some fullness,  tenderness across her forehead.  Also felt like she was having vision change.    She saw Dr Margaret Cheadleand was given steroid eye drops which helped.  Oral Surgeon gave her azithromycin to take before the surgery and should start them on 12/27/19 for the tooth extraction.  Hx of Bells Palsy, over 15-20 year ago.  She has prescription for metoprolol 252m but has not taken this.   She also has a prescription for xanax but has not tried it as of yet.  She does have other means of deep breathing and walking.    Past Medical History:  Diagnosis Date  . Allergy   . Essential tremor   . GERD (gastroesophageal reflux disease)   . Heart murmur   . High cholesterol   . Hyperlipidemia   . Hypertension    labile  . Labile hypertension   . Prediabetes      Allergies  Allergen Reactions  . Ampicillin Hives and Swelling  . Aspirin Itching  . Doxycycline Itching and Swelling  . Macrodantin [Nitrofurantoin Macrocrystal] Hives, Itching and Swelling  . Percocet [Oxycodone-Acetaminophen]   . Propulsid [Cisapride] Itching and Swelling  . Voltaren [Diclofenac] Hives and Itching  . Codeine Palpitations  . Penicillins Swelling and Rash    Current Outpatient Medications on File Prior to Visit  Medication Sig  . ALPRAZolam (XANAX) 0.25 MG tablet Take 1/2 to 1 tablet 3 x daily as need for anxiety  . Cholecalciferol (D3 PO) Take 5,000 Units by mouth daily.  . famotidine (PEPCID) 20 MG tablet Take 1 tablet (20 mg total) by mouth at bedtime.  . metoprolol succinate (TOPROL XL) 25 MG 24 hr tablet Take 1 tablet (25 mg total) by mouth at bedtime.  . Marland Kitchenzithromycin (ZITHROMAX)  250 MG tablet Take 250 mg by mouth.  . prednisoLONE acetate (PRED FORTE) 1 % ophthalmic suspension Place 1 drop into both eyes.   No current facility-administered medications on file prior to visit.    ROS: all negative except above.   Physical Exam:  BP 134/82   Pulse (!) 59   Temp (!) 97.4 F (36.3 C)   Wt 157 lb (71.2 kg)    SpO2 98%   BMI 26.95 kg/m   General Appearance: Well nourished, in no apparent distress. Eyes: PERRLA, EOMs, conjunctiva no swelling or erythema, mild ptosis to left. Sinuses: No Frontal/maxillary tenderness ENT/Mouth: Ext aud canals clear, TMs without erythema, bulging. No erythema, swelling, or exudate on post pharynx.  Tonsils not swollen or erythematous. Hearing normal.  Neck: Supple, thyroid normal.  Respiratory: Respiratory effort normal, BS equal bilaterally without rales, rhonchi, wheezing or stridor.  Cardio: RRR with no MRGs. Brisk peripheral pulses without edema.  Abdomen: Soft, + BS.  Non tender, no guarding, rebound, hernias, masses. Lymphatics: Non tender without lymphadenopathy.  Musculoskeletal: Full ROM, 5/5 strength, normal gait.  Skin: Warm, dry without rashes, lesions, ecchymosis.  Neuro: Cranial nerves intact. Normal muscle tone, no cerebellar symptoms. Sensation intact.  Psych: Awake and oriented X 3, normal affect, Insight and Judgment appropriate.     Margaret Sierras, NP 4:41 PM [facility]

## 2019-12-16 LAB — CBC WITH DIFFERENTIAL/PLATELET
Absolute Monocytes: 992 cells/uL — ABNORMAL HIGH (ref 200–950)
Basophils Absolute: 80 cells/uL (ref 0–200)
Basophils Relative: 0.7 %
Eosinophils Absolute: 228 cells/uL (ref 15–500)
Eosinophils Relative: 2 %
HCT: 40.1 % (ref 35.0–45.0)
Hemoglobin: 13.9 g/dL (ref 11.7–15.5)
Lymphs Abs: 2816 cells/uL (ref 850–3900)
MCH: 31.7 pg (ref 27.0–33.0)
MCHC: 34.7 g/dL (ref 32.0–36.0)
MCV: 91.6 fL (ref 80.0–100.0)
MPV: 11.2 fL (ref 7.5–12.5)
Monocytes Relative: 8.7 %
Neutro Abs: 7285 cells/uL (ref 1500–7800)
Neutrophils Relative %: 63.9 %
Platelets: 246 10*3/uL (ref 140–400)
RBC: 4.38 10*6/uL (ref 3.80–5.10)
RDW: 12.5 % (ref 11.0–15.0)
Total Lymphocyte: 24.7 %
WBC: 11.4 10*3/uL — ABNORMAL HIGH (ref 3.8–10.8)

## 2019-12-16 LAB — COMPLETE METABOLIC PANEL WITH GFR
AG Ratio: 1.5 (calc) (ref 1.0–2.5)
ALT: 44 U/L — ABNORMAL HIGH (ref 6–29)
AST: 46 U/L — ABNORMAL HIGH (ref 10–35)
Albumin: 4.8 g/dL (ref 3.6–5.1)
Alkaline phosphatase (APISO): 71 U/L (ref 37–153)
BUN/Creatinine Ratio: 12 (calc) (ref 6–22)
BUN: 12 mg/dL (ref 7–25)
CO2: 28 mmol/L (ref 20–32)
Calcium: 10 mg/dL (ref 8.6–10.4)
Chloride: 101 mmol/L (ref 98–110)
Creat: 0.97 mg/dL — ABNORMAL HIGH (ref 0.60–0.93)
GFR, Est African American: 67 mL/min/{1.73_m2} (ref >=60)
GFR, Est Non African American: 58 mL/min/{1.73_m2} — ABNORMAL LOW (ref >=60)
Globulin: 3.1 g/dL (calc) (ref 1.9–3.7)
Glucose, Bld: 102 mg/dL — ABNORMAL HIGH (ref 65–99)
Potassium: 4.5 mmol/L (ref 3.5–5.3)
Sodium: 139 mmol/L (ref 135–146)
Total Bilirubin: 0.6 mg/dL (ref 0.2–1.2)
Total Protein: 7.9 g/dL (ref 6.1–8.1)

## 2019-12-18 NOTE — Progress Notes (Signed)
Patient is aware of lab results and instructions. She states that she feels that the Alprazolam has helped and that her BP has been running higher than she would like it to be but thinks her cuff is very old and is going to get with a friend and see what it registers on their cuff.  She would like the Acyclovir and Prednisone sent [facility] but has concerns with how she will tolerate the acyclovir as she has never taken it before. Also wanted to make you aware that she just completed a round of prednisone for her eye. Does she still need to do another course? I explained that someone would contact her on Monday with your feedback.  -Osvaldo Human

## 2019-12-20 MED ORDER — PREDNISONE 10 MG (21) PO TBPK: ORAL_TABLET | Freq: Every day | ORAL | 0 refills | Status: DC

## 2019-12-20 MED ORDER — VALACYCLOVIR HCL 1 G PO TABS: 1000.0000 mg | ORAL_TABLET | Freq: Three times a day (TID) | ORAL | 0 refills | Status: AC

## 2019-12-22 ENCOUNTER — Telehealth: Payer: Self-pay

## 2019-12-22 NOTE — Telephone Encounter (Signed)
PATIENT has requested lab results be mailed out her address on file which has been placed in the mail.    But patient would like a PHONE call back from you, she states you were supposed to call her on Monday.

## 2019-12-22 NOTE — Telephone Encounter (Signed)
Patient has been informed & voiced understanding & was happy with the information.

## 2020-02-09 ENCOUNTER — Ambulatory Visit: Payer: Medicare Other | Admitting: Adult Health Nurse Practitioner

## 2020-02-09 ENCOUNTER — Encounter: Payer: Self-pay | Admitting: Adult Health Nurse Practitioner

## 2020-02-09 ENCOUNTER — Other Ambulatory Visit: Payer: Self-pay

## 2020-02-09 VITALS — BP 140/80 | HR 83 | Temp 97.3°F | Ht 64.0 in | Wt 158.0 lb

## 2020-02-09 DIAGNOSIS — Z136 Encounter for screening for cardiovascular disorders: Secondary | ICD-10-CM | POA: Diagnosis not present

## 2020-02-09 DIAGNOSIS — E7211 Homocystinuria: Secondary | ICD-10-CM | POA: Diagnosis not present

## 2020-02-09 DIAGNOSIS — E559 Vitamin D deficiency, unspecified: Secondary | ICD-10-CM

## 2020-02-09 DIAGNOSIS — R300 Dysuria: Secondary | ICD-10-CM

## 2020-02-09 DIAGNOSIS — R7983 Abnormal findings of blood amino-acid level: Secondary | ICD-10-CM

## 2020-02-09 DIAGNOSIS — Z79899 Other long term (current) drug therapy: Secondary | ICD-10-CM

## 2020-02-09 DIAGNOSIS — Z0000 Encounter for general adult medical examination without abnormal findings: Secondary | ICD-10-CM

## 2020-02-09 DIAGNOSIS — E782 Mixed hyperlipidemia: Secondary | ICD-10-CM

## 2020-02-09 DIAGNOSIS — Z8669 Personal history of other diseases of the nervous system and sense organs: Secondary | ICD-10-CM

## 2020-02-09 DIAGNOSIS — R7309 Other abnormal glucose: Secondary | ICD-10-CM

## 2020-02-09 DIAGNOSIS — R0989 Other specified symptoms and signs involving the circulatory and respiratory systems: Secondary | ICD-10-CM | POA: Diagnosis not present

## 2020-02-09 DIAGNOSIS — I10 Essential (primary) hypertension: Secondary | ICD-10-CM | POA: Diagnosis not present

## 2020-02-09 DIAGNOSIS — G250 Essential tremor: Secondary | ICD-10-CM

## 2020-02-09 DIAGNOSIS — K2100 Gastro-esophageal reflux disease with esophagitis, without bleeding: Secondary | ICD-10-CM

## 2020-02-09 DIAGNOSIS — D649 Anemia, unspecified: Secondary | ICD-10-CM

## 2020-02-09 NOTE — Progress Notes (Signed)
COMPLETE PHYSICAL   Assessment and Plan::   Labile hypertension Continue current medications:metoprolol 37m at night. Monitor blood pressure at home; call if consistently over 130/80 Continue DASH diet.   Reminder to go to the ER if any CP, SOB, nausea, dizziness, severe HA, changes vision/speech, left arm numbness and tingling and jaw pain. -     CBC with Differential/Platelet -     COMPLETE METABOLIC PANEL WITH GFR -     TSH - continue medications, DASH diet, exercise and monitor at home. Call if greater than 130/80.   Mixed hyperlipidemia -     Lipid panel discussed possible cardiac calcium score in the future to see if she needs statin/better stratify risk- no CP/SOB  History of Bell's palsy Monitor  Essential tremor Metoprolol 239mfor HTN & anxiety, helping with this? Continue to monior  Homocystinemia ([place]-     Folate RBC -     Vitamin B12 Declined Carotid US[place]  Anemia, unspecified type CBC Monitor No supplementation at this time  Abnormal glucose Discussed dietary and exercise modifications  Gastroesophageal reflux disease with esophagitis without hemorrhage Continue PPI/H2 blocker, diet discussed  Vitamin D deficiency Continue supplementation to maintain goal of 70-100 Taking Vitamin D 5,000 IU daily Defer vitamin D level this OV  Severe obesity (BMI 35.0-39.9) with comorbidity (HCPackwoodDiscussed dietary and exercise modifications  Dysuria -     Urinalysis w microscopic + reflex cultur -     REFLEXIVE URINE CULTURE  Medication management Continued   Further disposition pending results if labs check today. Discussed med's effects and SE's.   Over 30 minutes of face to face interview, exam, counseling, chart review, and critical decision making was performed.    Future Appointments  Date Time Provider De[place]6/20/2022 10:30 AM McGarnet SierrasNP [facility] None  02/08/2021  3:00 PM Jaselyn Nahm, KyDanton SewerNP [facility] None      HPI [id number].[email or web address] presents for complete physical.  Last visit she was complaining of anxiety, memory changes, anomia, tremor, saw Dr. LoErling Cruzn the past, states the tremor is worse on the left. She was referred to neurology, has appt with Dr. PeLeta Baptist7/13. She declines carotid US[place] MRI at the last visit.   ShGarnett Farmeports that has had bug bites in the past and reports it takes awhile to heal.  Now she reports the area to the back of her legs and now there is some heat below her knees all the way to the bottom of her feet.  She reports had tried hydrocortisone for the past two days.  This cools it from the heat.  It no longer is itchy although she reports she could be scratching it during the night.  She does not notice during the day when she is up moving around.      She was started on low dose toprol for HTN and anxiety, she is very resistant to medications. She did not take the metoprolol however she did increase water, started walking, started devotion, meditation and states she feels much better.   She has history of vascular migraine, episodes in the 80's, but had abnormal vision and HA in the last month- after discussing the abnormal vision further she denies complete loss of vision but states on the left side she could not comprehend what she was looking at. She then followed up with her orthodonist and had Xray that showed left root infection and going to get treatment that may have caused this pain.  She had a normal B12, normal zinc, normal iron, TSH, RPR, tick borne illness are normal. She did have an elevated homocystine.   BP Readings from Last 3 Encounters:  02/09/20 140/80  12/15/19 134/82  10/01/19 (!) 160/80   She is not on cholesterol medication, her last check she was not to goal. Lab Results  Component Value Date   CHOL 237 (H) 09/02/2019   HDL 35 (L) 09/02/2019   LDLCALC 165 (H) 09/02/2019   TRIG 208 (H) 09/02/2019   CHOLHDL 6.8 (H) 09/02/2019   She is  working on diet and exercise for abnormal glucose.  Lab Results  Component Value Date   HGBA1C 5.5 09/02/2019         Screening Tests: Immunization History  Administered Date(s) Administered  . Pneumococcal-Unspecified 03/05/1997  . Td 03/05/1998   Last colonoscopy: 2021 Dr. Silverio Decamp- repeat 3 years EGD 05/21/2019 Last mammogram: 07/2013 normal, DUE will call for an appointment Last pap smear/pelvic exam: 2010, declines another DEXA: 6-7 years, normal, declines another Hep panel neg 2013 [place] AB 02/2019-1. Chronic cholelithiasis without evidence of acute cholecystitis. 2. Coarse echotexture of the liver with slight lobulation of the liver suggesting the possibility of cirrhosis.  US neck 2013 CXR 04/2008  Prior vaccinations: TD or Tdap: 2000, DUE Influenza: declines Pneumococcal: 1999 Prevnar 13: declines Shingles: declines COVID: declines   Names of Other Physician/Practitioners you currently use: 1. [facility] here for primary care Last Dental Exam: Dr. Gloriann Loan, 2021, had extraction and is going to have another Last Eye Exam: Dr. Delman Cheadle, wears glasses, 2021 Patient Care Team: Sharyne Peach, MD as Consulting Physician (Ophthalmology) Earley Favor (Dentistry)  Patient Active Problem List   Diagnosis Date Noted  . Fatty liver 09/05/2016  . Poor compliance with medical recommendations 04/29/2014  . Vitamin D deficiency 06/10/2013  . Medication management 06/10/2013  . GERD   . Hyperlipidemia   . Abnormal glucose   . Hypertension       Current Outpatient Medications (Cardiovascular):  .  metoprolol succinate (TOPROL XL) 25 MG 24 hr tablet, Take 1 tablet (25 mg total) by mouth at bedtime.     Current Outpatient Medications (Other):  Marland Kitchen  ALPRAZolam (XANAX) 0.25 MG tablet, Take 1/2 to 1 tablet 3 x daily as need for anxiety .  Cholecalciferol (D3 PO), Take 5,000 Units by mouth daily. .  famotidine (PEPCID) 20 MG  tablet, Take 1 tablet (20 mg total) by mouth at bedtime.  Allergies  Allergen Reactions  . Ampicillin Hives and Swelling  . Aspirin Itching  . Doxycycline Itching and Swelling  . Macrodantin [Nitrofurantoin Macrocrystal] Hives, Itching and Swelling  . Percocet [Oxycodone-Acetaminophen]   . Propulsid [Cisapride] Itching and Swelling  . Voltaren [Diclofenac] Hives and Itching  . Codeine Palpitations  . Penicillins Swelling and Rash    ROS: all negative except above.   Physical Exam: Filed Weights   02/09/20 1524  Weight: 158 lb (71.7 kg)   BP 140/80   Pulse 83   Temp (!) 97.3 F (36.3 C)   Ht 5' 4"  (1.626 m)   Wt 158 lb (71.7 kg)   SpO2 97%   BMI 27.12 kg/m  General Appearance: Well nourished, in no apparent distress. Eyes: PERRLA, EOMs, conjunctiva no swelling or erythema Sinuses: No Frontal/maxillary tenderness ENT/Mouth: Ext aud canals clear, TMs without erythema, bulging. No erythema, swelling, or exudate on post pharynx.  Tonsils not swollen or erythematous. Hearing normal.  Neck: Supple, thyroid normal.  Respiratory: Respiratory effort normal, BS equal bilaterally without rales, rhonchi, wheezing or stridor.  Cardio: RRR with no MRGs. Brisk peripheral pulses without edema.  Abdomen: Soft, + BS.  Non tender, no guarding, rebound, hernias, masses. Lymphatics: Non tender without lymphadenopathy.  Musculoskeletal: Full ROM, 5/5 strength, normal gait.  Skin: Warm, dry without rashes, lesions, ecchymosis.  Neuro: Cranial nerves intact. Normal muscle tone, no cerebellar symptoms. Sensation intact.  Psych: Awake and oriented X 3, normal affect, Insight and Judgment appropriate.      Garnet Sierras, Laqueta Jean, DNP [facility] 12/107/2021  1:04 PM

## 2020-02-10 LAB — COMPLETE METABOLIC PANEL WITH GFR
AG Ratio: 1.8 (calc) (ref 1.0–2.5)
ALT: 53 U/L — ABNORMAL HIGH (ref 6–29)
AST: 66 U/L — ABNORMAL HIGH (ref 10–35)
Albumin: 5 g/dL (ref 3.6–5.1)
Alkaline phosphatase (APISO): 71 U/L (ref 37–153)
BUN: 14 mg/dL (ref 7–25)
CO2: 24 mmol/L (ref 20–32)
Calcium: 9.8 mg/dL (ref 8.6–10.4)
Chloride: 102 mmol/L (ref 98–110)
Creat: 0.79 mg/dL (ref 0.60–0.93)
GFR, Est African American: 85 mL/min/{1.73_m2} (ref >=60)
GFR, Est Non African American: 74 mL/min/{1.73_m2} (ref >=60)
Globulin: 2.8 g/dL (calc) (ref 1.9–3.7)
Glucose, Bld: 84 mg/dL (ref 65–99)
Potassium: 4.4 mmol/L (ref 3.5–5.3)
Sodium: 139 mmol/L (ref 135–146)
Total Bilirubin: 0.6 mg/dL (ref 0.2–1.2)
Total Protein: 7.8 g/dL (ref 6.1–8.1)

## 2020-02-10 LAB — URINALYSIS W MICROSCOPIC + REFLEX CULTURE
Bacteria, UA: NONE SEEN /HPF
Bilirubin Urine: NEGATIVE
Glucose, UA: NEGATIVE
Hgb urine dipstick: NEGATIVE
Hyaline Cast: NONE SEEN /LPF
Ketones, ur: NEGATIVE
Leukocyte Esterase: NEGATIVE
Nitrites, Initial: NEGATIVE
Protein, ur: NEGATIVE
RBC / HPF: NONE SEEN /HPF (ref 0–2)
Specific Gravity, Urine: 1.005 (ref 1.001–1.03)
Squamous Epithelial / HPF: NONE SEEN /HPF (ref <=5)
WBC, UA: NONE SEEN /HPF (ref 0–5)
pH: <=5 (ref 5.0–8.0)

## 2020-02-10 LAB — CBC WITH DIFFERENTIAL/PLATELET
Absolute Monocytes: 866 cells/uL (ref 200–950)
Basophils Absolute: 57 cells/uL (ref 0–200)
Basophils Relative: 0.5 %
Eosinophils Absolute: 228 cells/uL (ref 15–500)
Eosinophils Relative: 2 %
HCT: 40.6 % (ref 35.0–45.0)
Hemoglobin: 14 g/dL (ref 11.7–15.5)
Lymphs Abs: 2964 cells/uL (ref 850–3900)
MCH: 31.5 pg (ref 27.0–33.0)
MCHC: 34.5 g/dL (ref 32.0–36.0)
MCV: 91.4 fL (ref 80.0–100.0)
MPV: 10.9 fL (ref 7.5–12.5)
Monocytes Relative: 7.6 %
Neutro Abs: 7285 cells/uL (ref 1500–7800)
Neutrophils Relative %: 63.9 %
Platelets: 232 10*3/uL (ref 140–400)
RBC: 4.44 10*6/uL (ref 3.80–5.10)
RDW: 13 % (ref 11.0–15.0)
Total Lymphocyte: 26 %
WBC: 11.4 10*3/uL — ABNORMAL HIGH (ref 3.8–10.8)

## 2020-02-10 LAB — VITAMIN B12: Vitamin B-12: 376 pg/mL (ref 200–1100)

## 2020-02-10 LAB — NO CULTURE INDICATED

## 2020-02-17 NOTE — Progress Notes (Signed)
Patient is aware of lab results and instructions. -e welch

## 2020-06-08 DIAGNOSIS — H10412 Chronic giant papillary conjunctivitis, left eye: Secondary | ICD-10-CM | POA: Diagnosis not present

## 2020-06-21 DIAGNOSIS — H10413 Chronic giant papillary conjunctivitis, bilateral: Secondary | ICD-10-CM | POA: Diagnosis not present

## 2020-06-21 DIAGNOSIS — H43812 Vitreous degeneration, left eye: Secondary | ICD-10-CM | POA: Diagnosis not present

## 2020-06-24 DIAGNOSIS — H10412 Chronic giant papillary conjunctivitis, left eye: Secondary | ICD-10-CM | POA: Diagnosis not present

## 2020-07-11 DIAGNOSIS — H10412 Chronic giant papillary conjunctivitis, left eye: Secondary | ICD-10-CM | POA: Diagnosis not present

## 2020-07-18 NOTE — Progress Notes (Signed)
Future Appointments  Date Time Provider [place]  07/19/2020 10:00 AM Unk Pinto, MD [facility] None  08/22/2020 10:30 AM Garnet Sierras, NP [facility] None  02/08/2021  3:00 PM Liane Comber, NP [facility] None    History of Present Illness:      This very nice 75 yo DWF with  HTN, Hyperlipidemia,  GERD, Pre-Diabetes and Vitamin D Deficiency. Patient has been treated recently by [facility] for conjunctivitis of the left eye  With Prednisolone & Tobramycin driops w/o complete resolution .  BP is noted very elevated today at 200/106 as she apparently has self d/c'd her low dose gToprol 25 mg XL as she admits an aversion to taking any types of medications.    Medications  .  metoprolol succinate (TOPROL XL) 25 MG 24 hr tablet, Take 1 tablet (25 mg total) by mouth at bedtime. .  ALPRAZolam (XANAX) 0.25 MG tablet, Take 1/2 to 1 tablet 3 x daily as need for anxiety .  Cholecalciferol (D3 PO), Take 5,000 Units by mouth daily. .  famotidine (PEPCID) 20 MG tablet, Take 1 tablet (20 mg total) by mouth at bedtime.  Problem list She has GERD; Hyperlipidemia; Abnormal glucose; Hypertension; Vitamin D deficiency; Medication management; Poor compliance with medical recommendations; and Fatty liver on their problem list.   Observations/Objective:  BP (!) 154/82   Pulse 71   Temp 97.7 F (36.5 C)   Ht 5' 4"  (1.626 m)   Wt 161 lb (73 kg)   SpO2 96%   BMI 27.64 kg/m    BP rechecked with wrist & wall cuff x 2 at 200/106.   HEENT - EAC's/TM's - NL. No sinus tenderness.                  -  EOM's Nl/conjugate. PERRLA.                  -  Lt conjunctiva 2+ injected with a mucopurulent discharge. Rt eye is clear.                   -  No pre-auricular LN's.  Neck - supple.  Chest - Clear equal BS. Cor - Nl HS. RRR w/o sig MGR. PP 1(+). No edema. MS- FROM w/o deformities.  Gait Nl. Neuro -  Nl w/o focal abnormalities.  Assessment and Plan:  1. Labile  hypertension  - Advised restart g Toprol 25 mg qam   -New Rx -  Olmesartan 20 MG tablet; Take  1 tablet  every night  Disp: 90 tabs; Rf: 1  2. Mucopurulent conjunctivitis of left eye  - Advised restart the prednisolone & Tobramycin eye drops  OS- bid  - cefUROXime (CEFTIN) 250 MG tablet; Take 1 tablet 2 x /day with Meal for Infection  Disp: 30 tablet;  ( Patient has Pen Allergy (hives), but feel she is low risk for cross reaction)   - ROV 2 weeks to re-evaluate BP & eye  - Discussed meds & SE    Follow Up Instructions:       I discussed the assessment and treatment plan with the patient. The patient was provided an opportunity to ask questions and all were answered. The patient agreed with the plan and demonstrated an understanding of the instructions.        The patient was advised to call back or seek an in-person evaluation if the symptoms worsen or if the condition fails to improve as anticipated.  Kirtland Bouchard, MD

## 2020-07-19 ENCOUNTER — Other Ambulatory Visit: Payer: Self-pay

## 2020-07-19 ENCOUNTER — Encounter: Payer: Self-pay | Admitting: Internal Medicine

## 2020-07-19 ENCOUNTER — Ambulatory Visit: Payer: Medicare PPO | Admitting: Internal Medicine

## 2020-07-19 VITALS — BP 154/82 | HR 71 | Temp 97.7°F | Ht 64.0 in | Wt 161.0 lb

## 2020-07-19 DIAGNOSIS — H10022 Other mucopurulent conjunctivitis, left eye: Secondary | ICD-10-CM | POA: Diagnosis not present

## 2020-07-19 DIAGNOSIS — R0989 Other specified symptoms and signs involving the circulatory and respiratory systems: Secondary | ICD-10-CM

## 2020-07-19 MED ORDER — OLMESARTAN MEDOXOMIL 20 MG PO TABS: ORAL_TABLET | ORAL | 1 refills | Status: DC

## 2020-07-19 MED ORDER — CEFUROXIME AXETIL 250 MG PO TABS: ORAL_TABLET | ORAL | 0 refills | Status: DC

## 2020-07-19 NOTE — Patient Instructions (Signed)
Due to recent changes in healthcare laws, you may see the results of your imaging and laboratory studies on MyChart before your provider has had a chance to review them.  We understand that in some cases there may be results that are confusing or concerning to you. Not all laboratory results come back in the same time frame and the provider may be waiting for multiple results in order to interpret others.  Please give [place] 48 hours in order for your provider to thoroughly review all the results before contacting the office for clarification of your results.     ++++++++++++++++++++++++++++++++++  Vit D  & Vit C 1,000 mg   are recommended to help protect  against the Covid-19 and other Corona viruses.    Also it's recommended  to take  Zinc 50 mg  to help  protect against the Covid-19   and best place to get  is also on [email or web address]  and don't pay more than 6-8 cents /pill !   ===================================== Coronavirus (COVID-19) Are you at risk?  Are you at risk for the Coronavirus (COVID-19)?  To be considered HIGH RISK for Coronavirus (COVID-19), you have to meet the following criteria:  . Traveled to [place], [place], [place], [place] or [place]; or in the [place] to [place], [place], [place]  . or [place]; and have fever, cough, and shortness of breath within the last 2 weeks of travel OR . Been in close contact with a person diagnosed with COVID-19 within the last 2 weeks and have  . fever, cough,and shortness of breath .  . IF YOU DO NOT MEET THESE CRITERIA, YOU ARE CONSIDERED LOW RISK FOR COVID-19.  What to do if you are HIGH RISK for COVID-19?  Marland Kitchen If you are having a medical emergency, call 911. . Seek medical care right away. Before you go to a doctor's office, urgent care or emergency department, .  call ahead and tell them about your recent travel, contact with someone diagnosed with COVID-19  .  and your symptoms.  . You should receive instructions  from your physician's office regarding next steps of care.  . When you arrive at healthcare provider, tell the healthcare staff immediately you have returned from  . visiting [place], [place], [place], [place] or [place]; or traveled in the [place] to [place], [place],  . [place] or [place] in the last two weeks or you have been in close contact with a person diagnosed with  . COVID-19 in the last 2 weeks.   . Tell the health care staff about your symptoms: fever, cough and shortness of breath. . After you have been seen by a medical provider, you will be either: o Tested for (COVID-19) and discharged home on quarantine except to seek medical care if  o symptoms worsen, and asked to  - Stay home and avoid contact with others until you get your results (4-5 days)  - Avoid travel on public transportation if possible (such as bus, train, or airplane) or o Sent to the Emergency Department by EMS for evaluation, COVID-19 testing  and  o possible admission depending on your condition and test results.  What to do if you are LOW RISK for COVID-19?  Reduce your risk of any infection by using the same precautions used for avoiding the common cold or flu:  Marland Kitchen Wash your hands often with soap and warm water for at least 20 seconds.  If soap and water  are not readily available,  . use an alcohol-based hand sanitizer with at least 60% alcohol.  . If coughing or sneezing, cover your mouth and nose by coughing or sneezing into the elbow areas of your shirt or coat, .  into a tissue or into your sleeve (not your hands). . Avoid shaking hands with others and consider head nods or verbal greetings only. . Avoid touching your eyes, nose, or mouth with unwashed hands.  . Avoid close contact with people who are sick. . Avoid places or events with large numbers of people in one location, like concerts or sporting events. . Carefully consider travel plans you have or are making. . If you are planning  any travel outside or inside the [place], visit the CDC's Travelers' Health webpage for the latest health notices. . If you have some symptoms but not all symptoms, continue to monitor at home and seek medical attention  . if your symptoms worsen. . If you are having a medical emergency, call 911.   ++++++++++++++++++++++++++++++++ Recommend Adult Low Dose Aspirin or  coated  Aspirin 81 mg daily  To reduce risk of Colon Cancer 40 %,  Skin Cancer 26 % ,  Melanoma 46%  and  Pancreatic cancer 60% ++++++++++++++++++++++++++++++++ Vitamin D goal  is between 70-100.  Please make sure that you are taking your Vitamin D as directed.  It is very important as a natural anti-inflammatory  helping hair, skin, and nails, as well as reducing stroke and heart attack risk.  It helps your bones and helps with mood. It also decreases numerous cancer risks so please take it as directed.  Low Vit D is associated with a 200-300% higher risk for CANCER  and 200-300% higher risk for HEART   ATTACK  &  STROKE.   .....................................Marland Kitchen It is also associated with higher death rate at younger ages,  autoimmune diseases like Rheumatoid arthritis, Lupus, Multiple Sclerosis.    Also many other serious conditions, like depression, Alzheimer's Dementia, infertility, muscle aches, fatigue, fibromyalgia - just to name a few. ++++++++++++++++++++ Recommend the book "The END of DIETING" by Dr Excell Seltzer  & the book "The END of DIABETES " by Dr Excell Seltzer At [email or web address] - get book & Audio CD's    Being diabetic has a  300% increased risk for heart attack, stroke, cancer, and alzheimer- type vascular dementia. It is very important that you work harder with diet by avoiding all foods that are white. Avoid white rice (brown & wild rice is OK), white potatoes (sweetpotatoes in moderation is OK), White bread or wheat bread or anything made out of white flour like bagels, donuts, rolls, buns, biscuits, cakes,  pastries, cookies, pizza crust, and pasta (made from white flour & egg whites) - vegetarian pasta or spinach or wheat pasta is OK. Multigrain breads like Arnold's or Pepperidge [employer], or multigrain sandwich thins or flatbreads.  Diet, exercise and weight loss can reverse and cure diabetes in the early stages.  Diet, exercise and weight loss is very important in the control and prevention of complications of diabetes which affects every system in your body, ie. Brain - dementia/stroke, eyes - glaucoma/blindness, heart - heart attack/heart failure, kidneys - dialysis, stomach - gastric paralysis, intestines - malabsorption, nerves - severe painful neuritis, circulation - gangrene & loss of a leg(s), and finally cancer and Alzheimers.    I recommend avoid fried & greasy foods,  sweets/candy, white rice (brown or wild rice or Quinoa is OK), white potatoes (  sweet potatoes are OK) - anything made from white flour - bagels, doughnuts, rolls, buns, biscuits,white and wheat breads, pizza crust and traditional pasta made of white flour & egg white(vegetarian pasta or spinach or wheat pasta is OK).  Multi-grain bread is OK - like multi-grain flat bread or sandwich thins. Avoid alcohol in excess. Exercise is also important.    Eat all the vegetables you want - avoid meat, especially red meat and dairy - especially cheese.  Cheese is the most concentrated form of trans-fats which is the worst thing to clog up our arteries. Veggie cheese is OK which can be found in the fresh produce section at [employer] or [employer] or [employer]  +++++++++++++++++++++ DASH Eating Plan  DASH stands for "Dietary Approaches to Stop Hypertension."   The DASH eating plan is a healthy eating plan that has been shown to reduce high blood pressure (hypertension). Additional health benefits may include reducing the risk of type 2 diabetes mellitus, heart disease, and stroke. The DASH eating plan may also help with weight loss. WHAT DO I  NEED TO KNOW ABOUT THE DASH EATING PLAN? For the DASH eating plan, you will follow these general guidelines:  Choose foods with a percent daily value for sodium of less than 5% (as listed on the food label).  Use salt-free seasonings or herbs instead of table salt or sea salt.  Check with your health care provider or pharmacist before using salt substitutes.  Eat lower-sodium products, often labeled as "lower sodium" or "no salt added."  Eat fresh foods.  Eat more vegetables, fruits, and low-fat dairy products.  Choose whole grains. Look for the word "whole" as the first word in the ingredient list.  Choose fish   Limit sweets, desserts, sugars, and sugary drinks.  Choose heart-healthy fats.  Eat veggie cheese   Eat more home-cooked food and less restaurant, buffet, and fast food.  Limit fried foods.  Cook foods using methods other than frying.  Limit canned vegetables. If you do use them, rinse them well to decrease the sodium.  When eating at a restaurant, ask that your food be prepared with less salt, or no salt if possible.                      WHAT FOODS CAN I EAT? Read Dr Fara Olden Fuhrman's books on The End of Dieting & The End of Diabetes  Grains Whole grain or whole wheat bread. Brown rice. Whole grain or whole wheat pasta. Quinoa, bulgur, and whole grain cereals. Low-sodium cereals. Corn or whole wheat flour tortillas. Whole grain cornbread. Whole grain crackers. Low-sodium crackers.  Vegetables Fresh or frozen vegetables (raw, steamed, roasted, or grilled). Low-sodium or reduced-sodium tomato and vegetable juices. Low-sodium or reduced-sodium tomato sauce and paste. Low-sodium or reduced-sodium canned vegetables.   Fruits All fresh, canned (in natural juice), or frozen fruits.  Protein Products  All fish and seafood.  Dried beans, peas, or lentils. Unsalted nuts and seeds. Unsalted canned beans.  Dairy Low-fat dairy products, such as skim or 1% milk, 2% or  reduced-fat cheeses, low-fat ricotta or cottage cheese, or plain low-fat yogurt. Low-sodium or reduced-sodium cheeses.  Fats and Oils Tub margarines without trans fats. Light or reduced-fat mayonnaise and salad dressings (reduced sodium). Avocado. Safflower, olive, or canola oils. Natural peanut or almond butter.  Other Unsalted popcorn and pretzels. The items listed above may not be a complete list of recommended foods or beverages. Contact your dietitian for more  options.  +++++++++++++++  WHAT FOODS ARE NOT RECOMMENDED? Grains/ White flour or wheat flour White bread. White pasta. White rice. Refined cornbread. Bagels and croissants. Crackers that contain trans fat.  Vegetables  Creamed or fried vegetables. Vegetables in a . Regular canned vegetables. Regular canned tomato sauce and paste. Regular tomato and vegetable juices.  Fruits Dried fruits. Canned fruit in light or heavy syrup. Fruit juice.  Meat and Other Protein Products Meat in general - RED meat & White meat.  Fatty cuts of meat. Ribs, chicken wings, all processed meats as bacon, sausage, bologna, salami, fatback, hot dogs, bratwurst and packaged luncheon meats.  Dairy Whole or 2% milk, cream, half-and-half, and cream cheese. Whole-fat or sweetened yogurt. Full-fat cheeses or blue cheese. Non-dairy creamers and whipped toppings. Processed cheese, cheese spreads, or cheese curds.  Condiments Onion and garlic salt, seasoned salt, table salt, and sea salt. Canned and packaged gravies. Worcestershire sauce. Tartar sauce. Barbecue sauce. Teriyaki sauce. Soy sauce, including reduced sodium. Steak sauce. Fish sauce. Oyster sauce. Cocktail sauce. Horseradish. Ketchup and mustard. Meat flavorings and tenderizers. Bouillon cubes. Hot sauce. Tabasco sauce. Marinades. Taco seasonings. Relishes.  Fats and Oils Butter, stick margarine, lard, shortening and bacon fat. Coconut, palm kernel, or palm oils. Regular salad  dressings.  Pickles and olives. Salted popcorn and pretzels.  The items listed above may not be a complete list of foods and beverages to avoid.  ++++++++++++++++++++++++++++++  Hypertension, Adult Hypertension is another name for high blood pressure. High blood pressure forces your heart to work harder to pump blood. This can cause problems over time. There are two numbers in a blood pressure reading. There is a top number (systolic) over a bottom number (diastolic). It is best to have a blood pressure that is below 120/80. Healthy choices can help lower your blood pressure, or you may need medicine to help lower it. What are the causes? The cause of this condition is not known. Some conditions may be related to high blood pressure. What increases the risk?  Smoking.  Having type 2 diabetes mellitus, high cholesterol, or both.  Not getting enough exercise or physical activity.  Being overweight.  Having too much fat, sugar, calories, or salt (sodium) in your diet.  Drinking too much alcohol.  Having long-term (chronic) kidney disease.  Having a family history of high blood pressure.  Age. Risk increases with age.  Race. You may be at higher risk if you are African American.  Gender. Men are at higher risk than women before age 51. After age 66, women are at higher risk than men.  Having obstructive sleep apnea.  Stress. What are the signs or symptoms?  High blood pressure may not cause symptoms. Very high blood pressure (hypertensive crisis) may cause: ? Headache. ? Feelings of worry or nervousness (anxiety). ? Shortness of breath. ? Nosebleed. ? A feeling of being sick to your stomach (nausea). ? Throwing up (vomiting). ? Changes in how you see. ? Very bad chest pain. ? Seizures. How is this treated?  This condition is treated by making healthy lifestyle changes, such as: ? Eating healthy foods. ? Exercising more. ? Drinking less alcohol.  Your health care  provider may prescribe medicine if lifestyle changes are not enough to get your blood pressure under control, and if: ? Your top number is above 130. ? Your bottom number is above 80.  Your personal target blood pressure may vary. Follow these instructions at home: Eating and drinking  If told,  follow the DASH eating plan. To follow this plan: ? Fill one half of your plate at each meal with fruits and vegetables. ? Fill one fourth of your plate at each meal with whole grains. Whole grains include whole-wheat pasta, brown rice, and whole-grain bread. ? Eat or drink low-fat dairy products, such as skim milk or low-fat yogurt. ? Fill one fourth of your plate at each meal with low-fat (lean) proteins. Low-fat proteins include fish, chicken without skin, eggs, beans, and tofu. ? Avoid fatty meat, cured and processed meat, or chicken with skin. ? Avoid pre-made or processed food.  Eat less than 1,500 mg of salt each day.  Do not drink alcohol if: ? Your doctor tells you not to drink. ? You are pregnant, may be pregnant, or are planning to become pregnant.  If you drink alcohol: ? Limit how much you use to:  0-1 drink a day for women.  0-2 drinks a day for men. ? Be aware of how much alcohol is in your drink. In the [place] one drink equals one 12 oz bottle of beer (355 mL), one 5 oz glass of wine (148 mL), or one 1 oz glass of hard liquor (44 mL).  Lifestyle  Work with your doctor to stay at a healthy weight or to lose weight. Ask your doctor what the best weight is for you.  Get at least 30 minutes of exercise most days of the week. This may include walking, swimming, or biking.  Get at least 30 minutes of exercise that strengthens your muscles (resistance exercise) at least 3 days a week. This may include lifting weights or doing Pilates.  Do not use any products that contain nicotine or tobacco, such as cigarettes, e-cigarettes, and chewing tobacco. If you need help quitting, ask  your doctor.  Check your blood pressure at home as told by your doctor.  Keep all follow-up visits as told by your doctor. This is important.  Medicines  Take over-the-counter and prescription medicines only as told by your doctor. Follow directions carefully.  Do not skip doses of blood pressure medicine. The medicine does not work as well if you skip doses. Skipping doses also puts you at risk for problems.  Ask your doctor about side effects or reactions to medicines that you should watch for. Contact a doctor if you:  Think you are having a reaction to the medicine you are taking.  Have headaches that keep coming back (recurring).  Feel dizzy.  Have swelling in your ankles.  Have trouble with your vision. Get help right away if you:  Get a very bad headache.  Start to feel mixed up (confused).  Feel weak or numb.  Feel faint.  Have very bad pain in your: ? Chest. ? Belly (abdomen).  Throw up more than once.  Have trouble breathing. Summary  Hypertension is another name for high blood pressure.  High blood pressure forces your heart to work harder to pump blood.  For most people, a normal blood pressure is less than 120/80.  Making healthy choices can help lower blood pressure. If your blood pressure does not get lower with healthy choices, you may need to take medicine.  ++++++++++++++++++++++++++++++  Bacterial Conjunctivitis, Adult Bacterial conjunctivitis is an infection of the clear membrane that covers the white part of your eye and the inner surface of your eyelid (conjunctiva). When the blood vessels in your conjunctiva become inflamed, your eye becomes red or pink, and it will probably feel  itchy. Bacterial conjunctivitis spreads very easily from person to person (is contagious). It also spreads easily from one eye to the other eye. What are the causes? This condition is caused by bacteria. You may get the infection if you come into close contact  with:  A person who is infected with the bacteria.  Items that are contaminated with the bacteria, such as a face towel, contact lens solution, or eye makeup. What increases the risk? You are more likely to develop this condition if you:  Are exposed to other people who have the infection.  Wear contact lenses.  Have a sinus infection.  Have had a recent eye injury or surgery.  Have a weak body defense system (immune system).  Have a medical condition that causes dry eyes. What are the signs or symptoms? Symptoms of this condition include:  Thick, yellowish discharge from the eye. This may turn into a crust on the eyelid overnight and cause your eyelids to stick together.  Tearing or watery eyes.  Itchy eyes.  Burning feeling in your eyes.  Eye redness.  Swollen eyelids.  Blurred vision.  How is this diagnosed? This condition is diagnosed based on your symptoms and medical history. Your health care provider may also take a sample of discharge from your eye to find the cause of your infection. This is rarely done. How is this treated? This condition may be treated with:  Antibiotic eye drops or ointment to clear the infection more quickly and prevent the spread of infection to others.  Oral antibiotic medicines to treat infections that do not respond to drops or ointments or that last longer than 10 days.  Cool, wet cloths (cool compresses) placed on the eyes.  Artificial tears applied 2-6 times a day.  Follow these instructions at home: Medicines  Take or apply your antibiotic medicine as told by your health care provider. Do not stop taking or applying the antibiotic even if you start to feel better.  Take or apply over-the-counter and prescription medicines only as told by your health care provider.  Be very careful to avoid touching the edge of your eyelid with the eye-drop bottle or the ointment tube when you apply medicines to the affected eye. This will  keep you from spreading the infection to your other eye or to other people. Managing discomfort  Gently wipe away any drainage from your eye with a warm, wet washcloth or a cotton ball.  Apply a clean, cool compress to your eye for 10-20 minutes, 3-4 times a day. General instructions  Do not wear contact lenses until the inflammation is gone and your health care provider says it is safe to wear them again. Ask your health care provider how to sterilize or replace your contact lenses before you use them again. Wear glasses until you can resume wearing contact lenses.  Avoid wearing eye makeup until the inflammation is gone. Throw away any old eye cosmetics that may be contaminated.  Change or wash your pillowcase every day.  Do not share towels or washcloths. This may spread the infection.  Wash your hands often with soap and water. Use paper towels to dry your hands.  Avoid touching or rubbing your eyes.  Do not drive or use heavy machinery if your vision is blurred. Contact a health care provider if:  You have a fever.  Your symptoms do not get better after 10 days. Get help right away if you have:  A fever and your symptoms suddenly  get worse.  Severe pain when you move your eye.  Facial pain, redness, or swelling.  Sudden loss of vision. Summary  Bacterial conjunctivitis is an infection of the clear membrane that covers the white part of your eye and the inner surface of your eyelid (conjunctiva).  Bacterial conjunctivitis spreads very easily from person to person (is contagious).  Wash your hands often with soap and water. Use paper towels to dry your hands.  Take or apply your antibiotic medicine as told by your health care provider. Do not stop taking or applying the antibiotic even if you start to feel better.  Contact a health care provider if you have a fever or your symptoms do not get better after 10 days.

## 2020-07-20 ENCOUNTER — Other Ambulatory Visit: Payer: Self-pay | Admitting: *Deleted

## 2020-07-20 DIAGNOSIS — R0989 Other specified symptoms and signs involving the circulatory and respiratory systems: Secondary | ICD-10-CM

## 2020-07-20 DIAGNOSIS — G250 Essential tremor: Secondary | ICD-10-CM

## 2020-07-20 MED ORDER — METOPROLOL SUCCINATE ER 25 MG PO TB24: 25.0000 mg | ORAL_TABLET | Freq: Every day | ORAL | 11 refills | Status: DC

## 2020-08-05 ENCOUNTER — Ambulatory Visit: Payer: Medicare PPO | Admitting: Internal Medicine

## 2020-08-10 ENCOUNTER — Ambulatory Visit: Payer: Medicare PPO | Admitting: Internal Medicine

## 2020-08-22 ENCOUNTER — Ambulatory Visit: Payer: Medicare PPO | Admitting: Adult Health Nurse Practitioner

## 2020-08-24 DIAGNOSIS — L0889 Other specified local infections of the skin and subcutaneous tissue: Secondary | ICD-10-CM | POA: Diagnosis not present

## 2020-08-24 DIAGNOSIS — L309 Dermatitis, unspecified: Secondary | ICD-10-CM | POA: Diagnosis not present

## 2020-11-03 NOTE — Progress Notes (Deleted)
MEDICARE ANNUAL WELLNESS VISIT AND FOLLOW UP  Assessment:    Over 40 minutes of exam, counseling, chart review and critical decision making was performed Future Appointments  Date Time Provider [place]  11/08/2020 11:30 AM Magda Bernheim, NP [facility] None  02/08/2021  3:00 PM Magda Bernheim, NP [facility] None     Plan:   During the course of the visit the patient was educated and counseled about appropriate screening and preventive services including:   Pneumococcal vaccine  Prevnar 13 Influenza vaccine Td vaccine Screening electrocardiogram Bone densitometry screening Colorectal cancer screening Diabetes screening Glaucoma screening Nutrition counseling  Advanced directives: requested   Subjective:  Margaret Sullivan is a 75 y.o. female who presents for Medicare Annual Wellness Visit and 6 month follow up.   She has had elevated blood pressure for *** years. Her blood pressure {HAS HAS NOT:18834} been controlled at home, today their BP is   BP Readings from Last 3 Encounters:  07/19/20 (!) 154/82  02/09/20 140/80  12/15/19 134/82    She {DOES_DOES JAS:50539} workout. She denies chest pain, shortness of breath, dizziness.  She is not on cholesterol medication . Her cholesterol is not at goal. The cholesterol last visit was:   Lab Results  Component Value Date   CHOL 237 (H) 09/02/2019   HDL 35 (L) 09/02/2019   LDLCALC 165 (H) 09/02/2019   TRIG 208 (H) 09/02/2019   CHOLHDL 6.8 (H) 09/02/2019   She {Has/has not:18111} been working on diet and exercise for abnormal glucose Last A1C in the office was:  Lab Results  Component Value Date   HGBA1C 5.5 09/02/2019    Last GFR: Lab Results  Component Value Date   GFRNONAA 74 02/09/2020    Patient is on Vitamin D supplement.  Last Vit D Lab Results  Component Value Date   VD25OH 39 07/15/2019      Medication Review: Current Outpatient Medications on File Prior to Visit  Medication Sig Dispense Refill    ALPRAZolam (XANAX) 0.25 MG tablet Take 1/2 to 1 tablet 3 x daily as need for anxiety 60 tablet 0   cefUROXime (CEFTIN) 250 MG tablet Take 1 tablet 2 x /day with Meal for Infection 30 tablet 0   Cholecalciferol (D3 PO) Take 5,000 Units by mouth daily.     metoprolol succinate (TOPROL XL) 25 MG 24 hr tablet Take 1 tablet (25 mg total) by mouth at bedtime. 30 tablet 11   olmesartan (BENICAR) 20 MG tablet Take  1 tablet  every night for BP 90 tablet 1   No current facility-administered medications on file prior to visit.    Allergies  Allergen Reactions   Ampicillin Hives and Swelling   Aspirin Itching   Doxycycline Itching and Swelling   Macrodantin [Nitrofurantoin Macrocrystal] Hives, Itching and Swelling   Percocet [Oxycodone-Acetaminophen]    Propulsid [Cisapride] Itching and Swelling   Voltaren [Diclofenac] Hives and Itching   Codeine Palpitations   Penicillins Swelling and Rash    Current Problems (verified) Patient Active Problem List   Diagnosis Date Noted   Fatty liver 09/05/2016   Poor compliance with medical recommendations 04/29/2014   Vitamin D deficiency 06/10/2013   Medication management 06/10/2013   GERD    Hyperlipidemia    Abnormal glucose    Hypertension     Screening Tests Immunization History  Administered Date(s) Administered   Pneumococcal-Unspecified 03/05/1997   Td 03/05/1998    Preventative care: Last colonoscopy: 05/21/19 due 2024 Last mammogram: 2015  negative repeat 1 year overdue *** Last pap smear/pelvic exam: ***   DEXA:***  Prior vaccinations: TD or Tdap: 2000  Influenza: ***  Pneumococcal: *** Prevnar13:  Shingles/Zostavax: **  Names of Other Physician/Practitioners you currently use: 1. [facility] here for primary care 2. ***, eye doctor, last visit *** 3. ***, dentist, last visit *** Patient Care Team: Unk Pinto, MD as PCP - General (Internal Medicine) Sharyne Peach, MD as  Consulting Physician (Ophthalmology) Earley Favor (Dentistry) Delfina Redwood as Referring Physician (Physician Assistant) Delfina Redwood as Referring Physician (Physician Assistant)  SURGICAL HISTORY She  has a past surgical history that includes Back surgery (2001); Upper gastrointestinal endoscopy; Colonoscopy; Tonsillectomy and adenoidectomy (Bilateral, 1950); and Wisdom tooth extraction (Bilateral, 1969). FAMILY HISTORY Her family history includes Breast cancer in her father; Colon cancer in her maternal uncle; Heart attack in her father; Lymphoma in her mother. SOCIAL HISTORY She  reports that she has never smoked. She has never used smokeless tobacco. She reports that she does not drink alcohol and does not use drugs.   MEDICARE WELLNESS OBJECTIVES: Physical activity:   Cardiac risk factors:   Depression/mood screen:   Depression screen [facility] 2/9 07/15/2019  Decreased Interest 0  Down, Depressed, Hopeless 1  PHQ - 2 Score 1    ADLs:  No flowsheet data found.   Cognitive Testing  Alert? Yes  Normal Appearance?Yes  Oriented to person? Yes  Place? Yes   Time? Yes  Recall of three objects?  Yes  Can perform simple calculations? Yes  Displays appropriate judgment?Yes  Can read the correct time from a watch face?Yes  EOL planning:    ROS   Objective:     There were no vitals filed for this visit. There is no height or weight on file to calculate BMI.  General appearance: alert, no distress, WD/WN, female HEENT: normocephalic, sclerae anicteric, TMs pearly, nares patent, no discharge or erythema, pharynx normal Oral cavity: MMM, no lesions Neck: supple, no lymphadenopathy, no thyromegaly, no masses Heart: RRR, normal S1, S2, no murmurs Lungs: CTA bilaterally, no wheezes, rhonchi, or rales Abdomen: +bs, soft, non tender, non distended, no masses, no hepatomegaly, no splenomegaly Musculoskeletal: nontender, no swelling, no obvious deformity Extremities:  no edema, no cyanosis, no clubbing Pulses: 2+ symmetric, upper and lower extremities, normal cap refill Neurological: alert, oriented x 3, CN2-12 intact, strength normal upper extremities and lower extremities, sensation normal throughout, DTRs 2+ throughout, no cerebellar signs, gait normal Psychiatric: normal affect, behavior normal, pleasant   Medicare Attestation I have personally reviewed: The patient's medical and social history Their use of alcohol, tobacco or illicit drugs Their current medications and supplements The patient's functional ability including ADLs,fall risks, home safety risks, cognitive, and hearing and visual impairment Diet and physical activities Evidence for depression or mood disorders  The patient's weight, height, BMI, and visual acuity have been recorded in the chart.  I have made referrals, counseling, and provided education to the patient based on review of the above and I have provided the patient with a written personalized care plan for preventive services.     Magda Bernheim, NP   11/03/2020

## 2020-11-08 ENCOUNTER — Ambulatory Visit: Payer: Medicare PPO | Admitting: Nurse Practitioner

## 2020-11-08 DIAGNOSIS — Z1329 Encounter for screening for other suspected endocrine disorder: Secondary | ICD-10-CM

## 2020-11-08 DIAGNOSIS — D649 Anemia, unspecified: Secondary | ICD-10-CM

## 2020-11-08 DIAGNOSIS — R0989 Other specified symptoms and signs involving the circulatory and respiratory systems: Secondary | ICD-10-CM

## 2020-11-08 DIAGNOSIS — E7211 Homocystinuria: Secondary | ICD-10-CM

## 2020-11-08 DIAGNOSIS — R7309 Other abnormal glucose: Secondary | ICD-10-CM

## 2020-11-08 DIAGNOSIS — G250 Essential tremor: Secondary | ICD-10-CM

## 2020-11-08 DIAGNOSIS — E782 Mixed hyperlipidemia: Secondary | ICD-10-CM

## 2020-11-08 DIAGNOSIS — E559 Vitamin D deficiency, unspecified: Secondary | ICD-10-CM

## 2020-11-08 DIAGNOSIS — F419 Anxiety disorder, unspecified: Secondary | ICD-10-CM

## 2020-11-08 DIAGNOSIS — Z1389 Encounter for screening for other disorder: Secondary | ICD-10-CM

## 2020-11-08 DIAGNOSIS — K2100 Gastro-esophageal reflux disease with esophagitis, without bleeding: Secondary | ICD-10-CM

## 2020-11-08 DIAGNOSIS — Z79899 Other long term (current) drug therapy: Secondary | ICD-10-CM

## 2020-11-11 NOTE — Progress Notes (Signed)
MEDICARE ANNUAL WELLNESS VISIT AND FOLLOW UP  Assessment:  Valia was seen today for follow-up and medicare wellness.  Diagnoses and all orders for this visit:  Labile hypertension -     CBC with Differential/Platelet - - continue medications, DASH diet, exercise and monitor at home. Call if greater than 130/80.  Pt is to take Metoprolol 25 mg at hs if BP is greater than 130/80.  She is to keep a daily BP log and return in 1 month to review Poor compliance with medical recommendations Prefers lifestyle approach and will do research on health topics  Mixed hyperlipidemia -     COMPLETE METABOLIC PANEL WITH GFR -     Lipid panel - Long discussion on diet and exercise  Overweight -     TSH - Counseled on diet and exercise  Gastroesophageal reflux disease with esophagitis without hemorrhage -     Magnesium - Continue diet, may need H2/PPI if symptoms continue. Advised pt to research apple cider vinegar as possible option for treatment. If symptoms persist will refer back to Dr. Silverio Decamp.  Abnormal glucose -     Hemoglobin A1c - Discussed disease progression and risks Discussed diet/exercise, weight management and risk modification  Anemia, unspecified type  CBC  Continue diet high in fresh fruits and vegetables  Vitamin D deficiency -     VITAMIN D 25 Hydroxy (Vit-D Deficiency, Fractures) - Continue Vit D supplementation  Anxiety  -     ALPRAZolam (XANAX) 0.25 MG tablet; Take 1/2 to 1 tablet 3 x daily as need for anxiety  Continue diet and exercise to help control anxiety  Medication management -     CBC with Differential/Platelet -     COMPLETE METABOLIC PANEL WITH GFR -     Lipid panel -     TSH -     Hemoglobin A1c -     VITAMIN D 25 Hydroxy (Vit-D Deficiency, Fractures) -     Magnesium  Encounter for screening mammogram for malignant neoplasm of breast -     MM DIAG BREAST TOMO BILATERAL; Future    Over 40 minutes of exam, counseling, chart review and critical  decision making was performed Future Appointments  Date Time Provider [place]  02/08/2021  3:00 PM Magda Bernheim, NP [facility] None     Plan:   During the course of the visit the patient was educated and counseled about appropriate screening and preventive services including:   Pneumococcal vaccine  Prevnar 13 Influenza vaccine Td vaccine Screening electrocardiogram Bone densitometry screening Colorectal cancer screening Diabetes screening Glaucoma screening Nutrition counseling  Advanced directives: requested   Subjective:  Margaret Sullivan is a 75 y.o. female who presents for Medicare Annual Wellness Visit and 6 month follow up.   Past  has had for past 2 weeks, getting GERD in afternoon and is getting hoarse- no meds for GERD.  Pt not eating after 7 and eats light meals at dinner. Has fatigue. Infrequent dry cough. Denies fever , nausea, vomiting, diarrhea, muscles aches.  Hands were hot and itching starting last night, sees Dr. Ronnald Ramp at dermatology and is to be seen again tomorrow.   She has history of elevated blood pressure Was to start Toprol XL or Benicar. BP is running 150/80 at home Her blood pressure has not been controlled at home, today their BP is BP: (!) 152/90 BP Readings from Last 3 Encounters:  11/14/20 (!) 152/90  07/19/20 (!) 154/82  02/09/20 140/80  She does workout. She denies chest pain, shortness of breath, dizziness.  She is not on cholesterol medication . Her cholesterol is not at goal. The cholesterol last visit was:   Lab Results  Component Value Date   CHOL 237 (H) 09/02/2019   HDL 35 (L) 09/02/2019   LDLCALC 165 (H) 09/02/2019   TRIG 208 (H) 09/02/2019   CHOLHDL 6.8 (H) 09/02/2019   BMI is Body mass index is 25.95 kg/m., she has been working on diet and exercise. Wt Readings from Last 3 Encounters:  11/14/20 151 lb 3.2 oz (68.6 kg)  07/19/20 161 lb (73 kg)  02/09/20 158 lb (71.7 kg)    She has been working on diet and  exercise for abnormal glucose Last A1C in the office was:  Lab Results  Component Value Date   HGBA1C 5.5 09/02/2019    Last GFR: Lab Results  Component Value Date   GFRNONAA 74 02/09/2020    Patient is on Vitamin D supplement.  Last Vit D Lab Results  Component Value Date   VD25OH 39 07/15/2019      Medication Review: Current Outpatient Medications on File Prior to Visit  Medication Sig Dispense Refill   ALPRAZolam (XANAX) 0.25 MG tablet Take 1/2 to 1 tablet 3 x daily as need for anxiety 60 tablet 0   Cholecalciferol (D3 PO) Take 5,000 Units by mouth daily.     Zinc 30 MG TABS Take by mouth.     cefUROXime (CEFTIN) 250 MG tablet Take 1 tablet 2 x /day with Meal for Infection 30 tablet 0   metoprolol succinate (TOPROL XL) 25 MG 24 hr tablet Take 1 tablet (25 mg total) by mouth at bedtime. (Patient not taking: Reported on 11/14/2020) 30 tablet 11   olmesartan (BENICAR) 20 MG tablet Take  1 tablet  every night for BP (Patient not taking: Reported on 11/14/2020) 90 tablet 1   No current facility-administered medications on file prior to visit.    Allergies  Allergen Reactions   Ampicillin Hives and Swelling   Aspirin Itching   Doxycycline Itching and Swelling   Macrodantin [Nitrofurantoin Macrocrystal] Hives, Itching and Swelling   Percocet [Oxycodone-Acetaminophen]    Propulsid [Cisapride] Itching and Swelling   Voltaren [Diclofenac] Hives and Itching   Codeine Palpitations   Penicillins Swelling and Rash    Current Problems (verified) Patient Active Problem List   Diagnosis Date Noted   Fatty liver 09/05/2016   Poor compliance with medical recommendations 04/29/2014   Vitamin D deficiency 06/10/2013   Medication management 06/10/2013   GERD    Hyperlipidemia    Abnormal glucose    Hypertension     Screening Tests Immunization History  Administered Date(s) Administered   Pneumococcal-Unspecified 03/05/1997   Td 03/05/1998    Preventative care: Last  colonoscopy: 05/21/19 due 2024 Dr. Silverio Decamp Last mammogram: 2015 negative repeat 1 year overdue  Last pap smear/pelvic exam: declines    DEXA:declines  Prior vaccinations: TD or Tdap: 2000  Influenza: declines  Pneumococcal: declines Prevnar13:  Shingles/Zostavax: **  Names of Other Physician/Practitioners you currently use: 1.  [facility] here for primary care 2. Dr. Delman Cheadle, eye doctor, last visit 2022 3. Dr. Gloriann Loan, dentist, last visit 2022 Patient Care Team: Unk Pinto, MD as PCP - General (Internal Medicine) Sharyne Peach, MD as Consulting Physician (Ophthalmology) Earley Favor (Dentistry) Delfina Redwood as Referring Physician (Physician Assistant) Delfina Redwood as Referring Physician (Physician Assistant)  SURGICAL HISTORY She  has a past surgical history that includes Back surgery (2001); Upper gastrointestinal endoscopy; Colonoscopy; Tonsillectomy and adenoidectomy (Bilateral, 1950); and Wisdom tooth extraction (Bilateral, 1969). FAMILY HISTORY Her family history includes Breast cancer in her father; Colon cancer in her maternal uncle; Heart attack in her father; Lymphoma in her mother. SOCIAL HISTORY She  reports that she has never smoked. She has never used smokeless tobacco. She reports that she does not drink alcohol and does not use drugs.   MEDICARE WELLNESS OBJECTIVES: Physical activity: Current Exercise Habits: Home exercise routine, Type of exercise: walking, Time (Minutes): 30, Frequency (Times/Week): 3, Weekly Exercise (Minutes/Week): 90, Intensity: Mild Cardiac risk factors: Cardiac Risk Factors include: dyslipidemia;hypertension;advanced age (>58mn, >>69women);sedentary lifestyle Depression/mood screen:   [facility]  Decreased Interest 0  Down, Depressed, Hopeless 0  PHQ - 2 Score 0    ADLs:  In your present state of health, do you have any difficulty performing the  following activities: 11/14/2020  Hearing? N  Vision? N  Difficulty concentrating or making decisions? N  Walking or climbing stairs? N  Dressing or bathing? N  Doing errands, shopping? N  Some recent data might be hidden     Cognitive Testing  Alert? Yes  Normal Appearance?Yes  Oriented to person? Yes  Place? Yes   Time? Yes  Recall of three objects?  Yes  Can perform simple calculations? Yes  Displays appropriate judgment?Yes  Can read the correct time from a watch face?Yes  EOL planning: Does Patient Have a Medical Advance Directive?: No Would patient like information on creating a medical advance directive?: No - Patient declined  Review of Systems  Constitutional:  Negative for chills, fever and weight loss.  HENT:  Negative for congestion and hearing loss.   Eyes:  Negative for blurred vision and double vision.  Respiratory:  Negative for cough and shortness of breath.        Hoarseness of voice  Cardiovascular:  Negative for chest pain, palpitations, orthopnea and leg swelling.  Gastrointestinal:  Positive for heartburn. Negative for abdominal pain, constipation, diarrhea, nausea and vomiting.  Musculoskeletal:  Negative for falls, joint pain and myalgias.  Skin:  Negative for rash.  Neurological:  Negative for dizziness, tingling, tremors, loss of consciousness and headaches.  Psychiatric/Behavioral:  Negative for depression, memory loss and suicidal ideas.     Objective:     Today's Vitals   11/14/20 1537  BP: (!) 152/90  Pulse: 93  Temp: (!) 97.3 F (36.3 C)  SpO2: 96%  Weight: 151 lb 3.2 oz (68.6 kg)   Body mass index is 25.95 kg/m.  General appearance: alert, no distress, WD/WN, female HEENT: normocephalic, sclerae anicteric, TMs pearly, nares patent, no discharge or erythema, pharynx normal Oral cavity: MMM, no lesions Neck: supple, no lymphadenopathy, no thyromegaly, no masses Heart: RRR, normal S1, S2, no murmurs Lungs: CTA bilaterally, no wheezes,  rhonchi, or rales Abdomen: +bs, soft, non tender, non distended, no masses, no hepatomegaly, no splenomegaly Musculoskeletal: nontender, no swelling, no obvious deformity Extremities: no edema, no cyanosis, no clubbing Pulses: 2+ symmetric, upper and lower extremities, normal cap refill Neurological: alert, oriented x 3, CN2-12 intact, strength normal upper extremities and lower extremities, sensation normal throughout, DTRs 2+ throughout, no cerebellar signs, gait normal Psychiatric: normal affect, behavior normal, pleasant   Medicare Attestation I have personally reviewed: The patient's medical and social history Their use of alcohol, tobacco or illicit drugs Their current medications and supplements The patient's functional ability including  ADLs,fall risks, home safety risks, cognitive, and hearing and visual impairment Diet and physical activities Evidence for depression or mood disorders  The patient's weight, height, BMI, and visual acuity have been recorded in the chart.  I have made referrals, counseling, and provided education to the patient based on review of the above and I have provided the patient with a written personalized care plan for preventive services.     Magda Bernheim ANP-C  [facility]  11/14/2020

## 2020-11-14 ENCOUNTER — Encounter: Payer: Self-pay | Admitting: Nurse Practitioner

## 2020-11-14 ENCOUNTER — Ambulatory Visit: Payer: Medicare PPO | Admitting: Nurse Practitioner

## 2020-11-14 ENCOUNTER — Other Ambulatory Visit: Payer: Self-pay

## 2020-11-14 VITALS — BP 152/90 | HR 93 | Temp 97.3°F | Wt 151.2 lb

## 2020-11-14 DIAGNOSIS — K2100 Gastro-esophageal reflux disease with esophagitis, without bleeding: Secondary | ICD-10-CM

## 2020-11-14 DIAGNOSIS — R0989 Other specified symptoms and signs involving the circulatory and respiratory systems: Secondary | ICD-10-CM | POA: Diagnosis not present

## 2020-11-14 DIAGNOSIS — E7211 Homocystinuria: Secondary | ICD-10-CM

## 2020-11-14 DIAGNOSIS — Z0001 Encounter for general adult medical examination with abnormal findings: Secondary | ICD-10-CM

## 2020-11-14 DIAGNOSIS — G250 Essential tremor: Secondary | ICD-10-CM

## 2020-11-14 DIAGNOSIS — E782 Mixed hyperlipidemia: Secondary | ICD-10-CM | POA: Diagnosis not present

## 2020-11-14 DIAGNOSIS — D649 Anemia, unspecified: Secondary | ICD-10-CM | POA: Diagnosis not present

## 2020-11-14 DIAGNOSIS — E559 Vitamin D deficiency, unspecified: Secondary | ICD-10-CM | POA: Diagnosis not present

## 2020-11-14 DIAGNOSIS — Z79899 Other long term (current) drug therapy: Secondary | ICD-10-CM

## 2020-11-14 DIAGNOSIS — R7309 Other abnormal glucose: Secondary | ICD-10-CM | POA: Diagnosis not present

## 2020-11-14 DIAGNOSIS — F419 Anxiety disorder, unspecified: Secondary | ICD-10-CM

## 2020-11-14 DIAGNOSIS — R6889 Other general symptoms and signs: Secondary | ICD-10-CM | POA: Diagnosis not present

## 2020-11-14 DIAGNOSIS — E663 Overweight: Secondary | ICD-10-CM

## 2020-11-14 DIAGNOSIS — Z1231 Encounter for screening mammogram for malignant neoplasm of breast: Secondary | ICD-10-CM

## 2020-11-14 NOTE — Patient Instructions (Addendum)
GENERAL HEALTH GOALS   Know what a healthy weight is for you (roughly BMI <25) and aim to maintain this   Aim for 7+ servings of fruits and vegetables daily   70-80+ fluid ounces of water or unsweet tea for healthy kidneys   Limit to max 1 drink of alcohol per day; avoid smoking/tobacco   Limit animal fats in diet for cholesterol and heart health - choose grass fed whenever available   Avoid highly processed foods, and foods high in saturated/trans fats   Aim for low stress - take time to unwind and care for your mental health   Aim for 150 min of moderate intensity exercise weekly for heart health, and weights twice weekly for bone health   Aim for 7-9 hours of sleep daily   [employer] Patient Education  [place].

## 2020-11-15 DIAGNOSIS — L308 Other specified dermatitis: Secondary | ICD-10-CM | POA: Diagnosis not present

## 2020-11-15 LAB — COMPLETE METABOLIC PANEL WITH GFR
AG Ratio: 1.4 (calc) (ref 1.0–2.5)
ALT: 41 U/L — ABNORMAL HIGH (ref 6–29)
AST: 53 U/L — ABNORMAL HIGH (ref 10–35)
Albumin: 5 g/dL (ref 3.6–5.1)
Alkaline phosphatase (APISO): 90 U/L (ref 37–153)
BUN: 10 mg/dL (ref 7–25)
CO2: 30 mmol/L (ref 20–32)
Calcium: 10.2 mg/dL (ref 8.6–10.4)
Chloride: 101 mmol/L (ref 98–110)
Creat: 0.74 mg/dL (ref 0.60–1.00)
Globulin: 3.5 g/dL (calc) (ref 1.9–3.7)
Glucose, Bld: 90 mg/dL (ref 65–99)
Potassium: 4.6 mmol/L (ref 3.5–5.3)
Sodium: 140 mmol/L (ref 135–146)
Total Bilirubin: 0.6 mg/dL (ref 0.2–1.2)
Total Protein: 8.5 g/dL — ABNORMAL HIGH (ref 6.1–8.1)
eGFR: 85 mL/min/{1.73_m2} (ref >=60)

## 2020-11-15 LAB — LIPID PANEL
Cholesterol: 250 mg/dL — ABNORMAL HIGH (ref <200)
HDL: 35 mg/dL — ABNORMAL LOW (ref >=50)
LDL Cholesterol (Calc): 170 mg/dL (calc) — ABNORMAL HIGH
Non-HDL Cholesterol (Calc): 215 mg/dL (calc) — ABNORMAL HIGH (ref <130)
Total CHOL/HDL Ratio: 7.1 (calc) — ABNORMAL HIGH (ref <5.0)
Triglycerides: 274 mg/dL — ABNORMAL HIGH (ref <150)

## 2020-11-15 LAB — CBC WITH DIFFERENTIAL/PLATELET
Absolute Monocytes: 824 cells/uL (ref 200–950)
Basophils Absolute: 58 cells/uL (ref 0–200)
Basophils Relative: 0.5 %
Eosinophils Absolute: 313 cells/uL (ref 15–500)
Eosinophils Relative: 2.7 %
HCT: 43.8 % (ref 35.0–45.0)
Hemoglobin: 14.7 g/dL (ref 11.7–15.5)
Lymphs Abs: 2517 cells/uL (ref 850–3900)
MCH: 30.5 pg (ref 27.0–33.0)
MCHC: 33.6 g/dL (ref 32.0–36.0)
MCV: 90.9 fL (ref 80.0–100.0)
MPV: 10.5 fL (ref 7.5–12.5)
Monocytes Relative: 7.1 %
Neutro Abs: 7888 cells/uL — ABNORMAL HIGH (ref 1500–7800)
Neutrophils Relative %: 68 %
Platelets: 264 10*3/uL (ref 140–400)
RBC: 4.82 10*6/uL (ref 3.80–5.10)
RDW: 12.5 % (ref 11.0–15.0)
Total Lymphocyte: 21.7 %
WBC: 11.6 10*3/uL — ABNORMAL HIGH (ref 3.8–10.8)

## 2020-11-15 LAB — TSH: TSH: 3.32 mIU/L (ref 0.40–4.50)

## 2020-11-15 LAB — VITAMIN D 25 HYDROXY (VIT D DEFICIENCY, FRACTURES): Vit D, 25-Hydroxy: 36 ng/mL (ref 30–100)

## 2020-11-15 LAB — HEMOGLOBIN A1C
Hgb A1c MFr Bld: 5.4 % of total Hgb (ref <5.7)
Mean Plasma Glucose: 108 mg/dL
eAG (mmol/L): 6 mmol/L

## 2020-11-15 LAB — MAGNESIUM: Magnesium: 2.4 mg/dL (ref 1.5–2.5)

## 2020-11-15 NOTE — Progress Notes (Signed)
PATIENT IS AWARE OF LAB RESULTS AND INSTRUCTIONS. Margaret Sullivan [facility]

## 2020-11-17 DIAGNOSIS — H33302 Unspecified retinal break, left eye: Secondary | ICD-10-CM | POA: Diagnosis not present

## 2020-11-17 DIAGNOSIS — H5213 Myopia, bilateral: Secondary | ICD-10-CM | POA: Diagnosis not present

## 2020-11-18 ENCOUNTER — Other Ambulatory Visit: Payer: Self-pay

## 2020-11-18 ENCOUNTER — Encounter (INDEPENDENT_AMBULATORY_CARE_PROVIDER_SITE_OTHER): Payer: Medicare PPO | Admitting: Ophthalmology

## 2020-11-18 DIAGNOSIS — I10 Essential (primary) hypertension: Secondary | ICD-10-CM

## 2020-11-18 DIAGNOSIS — H35033 Hypertensive retinopathy, bilateral: Secondary | ICD-10-CM

## 2020-11-18 DIAGNOSIS — H43813 Vitreous degeneration, bilateral: Secondary | ICD-10-CM

## 2020-11-18 DIAGNOSIS — H318 Other specified disorders of choroid: Secondary | ICD-10-CM | POA: Diagnosis not present

## 2020-11-18 DIAGNOSIS — H33022 Retinal detachment with multiple breaks, left eye: Secondary | ICD-10-CM

## 2020-11-22 ENCOUNTER — Other Ambulatory Visit: Payer: Self-pay

## 2020-11-22 ENCOUNTER — Encounter (INDEPENDENT_AMBULATORY_CARE_PROVIDER_SITE_OTHER): Payer: Medicare PPO | Admitting: Ophthalmology

## 2020-11-22 DIAGNOSIS — H318 Other specified disorders of choroid: Secondary | ICD-10-CM | POA: Diagnosis not present

## 2020-11-24 DIAGNOSIS — H4313 Vitreous hemorrhage, bilateral: Secondary | ICD-10-CM | POA: Diagnosis not present

## 2020-11-24 DIAGNOSIS — H3562 Retinal hemorrhage, left eye: Secondary | ICD-10-CM | POA: Diagnosis not present

## 2020-11-24 DIAGNOSIS — H2513 Age-related nuclear cataract, bilateral: Secondary | ICD-10-CM | POA: Diagnosis not present

## 2020-11-24 DIAGNOSIS — Z83518 Family history of other specified eye disorder: Secondary | ICD-10-CM | POA: Diagnosis not present

## 2020-12-02 NOTE — Progress Notes (Addendum)
Margaret Sullivan was seen today for acute visit.  Diagnoses and all orders for this visit:  Palpitations -     EKG 12-Lead -     TSH - Continue to monitor Go to the ER if any chest pain, shortness of breath, nausea, dizziness, severe HA, changes vision/speech   Subretinal hemorrhage, left  Continue to follow with Dr. Delman Cheadle  Choroidal neovascular membrane of left eye  Continue to follow with Dr. Delman Cheadle  Labile hypertension -     CBC with Differential/Platelet -- continue medications, DASH diet, exercise and monitor at home. Call if greater than 130/80.  Pt has agreed she will consistently take BP and if BP is running > 130/80 she will take the metoprolol  Mixed hyperlipidemia -     CBC with Differential/Platelet -     TSH - Continue diet and exercise  Elevated LFTs -     Hepatic function panel  Anxiety  Pt is very stressed over multiple health problems she has been dealing with in the past month.  Long discussion on use of Xanax as well as use of an SSRI such as low dose of Lexapro.  Pt does not wish to pursue SSRI currently and is only taking Xanax as needed.    Continue behavior modifications and monitor symptoms. If anxiety is worsening would like to pursue Lexapro    Further disposition pending results of labs. Discussed med's effects and SE's.   Over 30 minutes of exam, counseling, chart review, and critical decision making was performed.   Future Appointments  Date Time Provider [place]  12/19/2020  9:00 AM Hayden Pedro, MD [facility] None  02/16/2021  3:00 PM Magda Bernheim, NP [facility] None    ------------------------------------------------------------------------------------------------------------------   HPI BP (!) 162/70   Pulse 80   Temp (!) 97.5 F (36.4 C)   Wt 150 lb 12.8 oz (68.4 kg)   SpO2 97%   BMI 25.88 kg/m  75 y.o.female presents for anxiety.  Pt is currently on Toprol XL 25 mg at bedtime if BP > 130/80 started since she had not been  taking the medication when seen 11/14/20. At home BP's are running 130/60's- 180's/90's. Has had many stressors in the past 2 weeks so had not been taking Metoprolol as planned.  BP Readings from Last 3 Encounters:  12/05/20 (!) 162/70  11/14/20 (!) 152/90  07/19/20 (!) 154/82    Dermatologist was seen 2 weeks ago dermatitis and is using betamethasone nightly until symptoms gone then taper.  Symptoms have improved.   Pt had eye exam 2 weeks ago.  Floater in left eye. Dr. Delman Cheadle evaluating, not a detached retina.  Saw Dr. Tempie Hoist retina specialist discovered had growth of harmful vessels in left eye - should be treated with Avastin injections in left eye 2 weeks ago and is being sent to [facility] for second opinion which concurred with initial diagnosis. Is to have second injection on 12/19/20 and depending on results it will determine number of injections needed   Pt is having a flutter in sternal region. Denies reflux but has difficulty swallowing water and solids- esophageal spasms. Can not definitively describe as a palpitation.  Denies chest pain, shoulder pain, jaw pain.  Past Medical History:  Diagnosis Date   Allergy    Essential tremor    GERD (gastroesophageal reflux disease)    Heart murmur    High cholesterol    Hyperlipidemia    Hypertension    labile   Labile hypertension  Prediabetes      Allergies  Allergen Reactions   Ampicillin Hives and Swelling   Aspirin Itching   Doxycycline Itching and Swelling   Macrodantin [Nitrofurantoin Macrocrystal] Hives, Itching and Swelling   Percocet [Oxycodone-Acetaminophen]    Propulsid [Cisapride] Itching and Swelling   Voltaren [Diclofenac] Hives and Itching   Codeine Palpitations   Penicillins Swelling and Rash    Current Outpatient Medications on File Prior to Visit  Medication Sig   ALPRAZolam (XANAX) 0.25 MG tablet Take 1/2 to 1 tablet 3 x daily as need for anxiety   Cholecalciferol (D3 PO) Take 5,000 Units by mouth  daily.   metoprolol succinate (TOPROL XL) 25 MG 24 hr tablet Take 1 tablet (25 mg total) by mouth at bedtime. (Patient not taking: Reported on 11/14/2020)   Zinc 30 MG TABS Take by mouth.   No current facility-administered medications on file prior to visit.    Review of Systems  Constitutional:  Negative for chills, fever and weight loss.  HENT:  Negative for congestion and hearing loss.   Eyes:  Negative for blurred vision and double vision.       Floater in left eye.   Respiratory:  Negative for cough and shortness of breath.   Cardiovascular:  Positive for palpitations (infrequent, occur with anxiety). Negative for chest pain, orthopnea and leg swelling.  Gastrointestinal:  Positive for abdominal pain (flutter upper abdomen). Negative for constipation, diarrhea, heartburn, nausea and vomiting.  Musculoskeletal:  Negative for falls, joint pain and myalgias.  Skin:  Negative for rash.  Neurological:  Negative for dizziness, tingling, tremors, loss of consciousness and headaches.  Endo/Heme/Allergies:  Does not bruise/bleed easily.  Psychiatric/Behavioral:  Negative for depression, memory loss and suicidal ideas. The patient is nervous/anxious.     Physical Exam:  BP (!) 162/70   Pulse 80   Temp (!) 97.5 F (36.4 C)   Wt 150 lb 12.8 oz (68.4 kg)   SpO2 97%   BMI 25.88 kg/m   General Appearance: Well nourished, in no apparent distress. Eyes: PERRLA, EOMs, conjunctiva no swelling or erythema Sinuses: No Frontal/maxillary tenderness ENT/Mouth: Ext aud canals clear, TMs without erythema, bulging. No erythema, swelling, or exudate on post pharynx.  Tonsils not swollen or erythematous. Hearing normal.  Neck: Supple, thyroid normal.  Respiratory: Respiratory effort normal, BS equal bilaterally without rales, rhonchi, wheezing or stridor.  Cardio: RRR with no MRGs. Brisk peripheral pulses without edema.  Abdomen: Soft, + BS.  Non tender, no guarding, rebound, hernias,  masses. Lymphatics: Non tender without lymphadenopathy.  Musculoskeletal: Full ROM, 5/5 strength, normal gait.  Skin: Warm, dry without rashes, lesions, ecchymosis.  Neuro: Cranial nerves intact. Normal muscle tone, no cerebellar symptoms. Sensation intact.  Psych: Awake and oriented X 3, normal affect, Insight and Judgment appropriate.  EKG : IRBBB, normal sinus rhythm.   Magda Bernheim, NP 9:13 AM [facility]

## 2020-12-05 ENCOUNTER — Ambulatory Visit: Payer: Medicare PPO | Admitting: Nurse Practitioner

## 2020-12-05 ENCOUNTER — Encounter: Payer: Self-pay | Admitting: Nurse Practitioner

## 2020-12-05 ENCOUNTER — Other Ambulatory Visit: Payer: Self-pay

## 2020-12-05 VITALS — BP 162/70 | HR 80 | Temp 97.5°F | Wt 150.8 lb

## 2020-12-05 DIAGNOSIS — H35052 Retinal neovascularization, unspecified, left eye: Secondary | ICD-10-CM

## 2020-12-05 DIAGNOSIS — R0989 Other specified symptoms and signs involving the circulatory and respiratory systems: Secondary | ICD-10-CM

## 2020-12-05 DIAGNOSIS — F419 Anxiety disorder, unspecified: Secondary | ICD-10-CM

## 2020-12-05 DIAGNOSIS — E782 Mixed hyperlipidemia: Secondary | ICD-10-CM

## 2020-12-05 DIAGNOSIS — R002 Palpitations: Secondary | ICD-10-CM

## 2020-12-05 DIAGNOSIS — H3562 Retinal hemorrhage, left eye: Secondary | ICD-10-CM | POA: Diagnosis not present

## 2020-12-05 DIAGNOSIS — R7989 Other specified abnormal findings of blood chemistry: Secondary | ICD-10-CM

## 2020-12-05 NOTE — Patient Instructions (Signed)

## 2020-12-06 LAB — CBC WITH DIFFERENTIAL/PLATELET
Absolute Monocytes: 660 cells/uL (ref 200–950)
Basophils Absolute: 58 cells/uL (ref 0–200)
Basophils Relative: 0.6 %
Eosinophils Absolute: 204 cells/uL (ref 15–500)
Eosinophils Relative: 2.1 %
HCT: 42.6 % (ref 35.0–45.0)
Hemoglobin: 14.4 g/dL (ref 11.7–15.5)
Lymphs Abs: 1950 cells/uL (ref 850–3900)
MCH: 30.6 pg (ref 27.0–33.0)
MCHC: 33.8 g/dL (ref 32.0–36.0)
MCV: 90.6 fL (ref 80.0–100.0)
MPV: 10.4 fL (ref 7.5–12.5)
Monocytes Relative: 6.8 %
Neutro Abs: 6829 cells/uL (ref 1500–7800)
Neutrophils Relative %: 70.4 %
Platelets: 230 10*3/uL (ref 140–400)
RBC: 4.7 10*6/uL (ref 3.80–5.10)
RDW: 12.3 % (ref 11.0–15.0)
Total Lymphocyte: 20.1 %
WBC: 9.7 10*3/uL (ref 3.8–10.8)

## 2020-12-06 LAB — TSH: TSH: 3.61 mIU/L (ref 0.40–4.50)

## 2020-12-19 ENCOUNTER — Other Ambulatory Visit: Payer: Self-pay

## 2020-12-19 ENCOUNTER — Encounter (INDEPENDENT_AMBULATORY_CARE_PROVIDER_SITE_OTHER): Payer: Medicare PPO | Admitting: Ophthalmology

## 2020-12-19 DIAGNOSIS — H318 Other specified disorders of choroid: Secondary | ICD-10-CM

## 2020-12-19 DIAGNOSIS — I10 Essential (primary) hypertension: Secondary | ICD-10-CM

## 2020-12-19 DIAGNOSIS — H43813 Vitreous degeneration, bilateral: Secondary | ICD-10-CM

## 2020-12-19 DIAGNOSIS — H35033 Hypertensive retinopathy, bilateral: Secondary | ICD-10-CM | POA: Diagnosis not present

## 2021-01-16 ENCOUNTER — Encounter (INDEPENDENT_AMBULATORY_CARE_PROVIDER_SITE_OTHER): Payer: Medicare PPO | Admitting: Ophthalmology

## 2021-01-17 ENCOUNTER — Encounter (INDEPENDENT_AMBULATORY_CARE_PROVIDER_SITE_OTHER): Payer: Medicare PPO | Admitting: Ophthalmology

## 2021-01-17 ENCOUNTER — Other Ambulatory Visit: Payer: Self-pay

## 2021-01-17 DIAGNOSIS — H3562 Retinal hemorrhage, left eye: Secondary | ICD-10-CM | POA: Diagnosis not present

## 2021-01-17 DIAGNOSIS — H2513 Age-related nuclear cataract, bilateral: Secondary | ICD-10-CM

## 2021-01-17 DIAGNOSIS — H35033 Hypertensive retinopathy, bilateral: Secondary | ICD-10-CM

## 2021-01-17 DIAGNOSIS — I10 Essential (primary) hypertension: Secondary | ICD-10-CM

## 2021-01-17 DIAGNOSIS — H43813 Vitreous degeneration, bilateral: Secondary | ICD-10-CM

## 2021-02-08 ENCOUNTER — Encounter: Payer: Medicare PPO | Admitting: Nurse Practitioner

## 2021-02-14 ENCOUNTER — Encounter (INDEPENDENT_AMBULATORY_CARE_PROVIDER_SITE_OTHER): Payer: Medicare PPO | Admitting: Ophthalmology

## 2021-02-14 NOTE — Progress Notes (Deleted)
COMPLETE PHYSICAL   Assessment and Plan::   Labile hypertension Continue current medications:metoprolol 59m at night. Monitor blood pressure at home; call if consistently over 130/80 Continue DASH diet.   Reminder to go to the ER if any CP, SOB, nausea, dizziness, severe HA, changes vision/speech, left arm numbness and tingling and jaw pain. -     CBC with Differential/Platelet -     COMPLETE METABOLIC PANEL WITH GFR -     TSH - continue medications, DASH diet, exercise and monitor at home. Call if greater than 130/80.   Mixed hyperlipidemia -     Lipid panel discussed possible cardiac calcium score in the future to see if she needs statin/better stratify risk- no CP/SOB  History of Bell's palsy Monitor  Essential tremor Metoprolol 213mfor HTN & anxiety, helping with this? Continue to monior  Homocystinemia (HCQuincy-     Folate RBC -     Vitamin B12 Declined Carotid [place]  Anemia, unspecified type CBC Monitor No supplementation at this time  Abnormal glucose Discussed dietary and exercise modifications  Gastroesophageal reflux disease with esophagitis without hemorrhage Continue PPI/H2 blocker, diet discussed  Vitamin D deficiency Continue supplementation to maintain goal of 70-100 Taking Vitamin D 5,000 IU daily Defer vitamin D level this OV  Severe obesity (BMI 35.0-39.9) with comorbidity (HCOak HallDiscussed dietary and exercise modifications  Dysuria -     Urinalysis w microscopic + reflex cultur -     REFLEXIVE URINE CULTURE  Medication management Continued   Further disposition pending results if labs check today. Discussed med's effects and SE's.   Over 30 minutes of face to face interview, exam, counseling, chart review, and critical decision making was performed.    Future Appointments  Date Time Provider [place]/15/2022  3:00 PM MuMagda BernheimNP [facility] None  02/17/2021  8:45 AM MaHayden PedroMD [facility] None      HPI [age].o.female presents for complete physical.  Last visit she was complaining of anxiety, memory changes, anomia, tremor, saw Dr. LoErling Cruzn the past, states the tremor is worse on the left. She was referred to neurology, has appt with Dr. PeLeta Baptist7/13. She declines carotid [place] MRI at the last visit.   [place] that has had bug bites in the past and reports it takes awhile to heal.  Now she reports the area to the back of her legs and now there is some heat below her knees all the way to the bottom of her feet.  She reports had tried hydrocortisone for the past two days.  This cools it from the heat.  It no longer is itchy although she reports she could be scratching it during the night.  She does not notice during the day when she is up moving around.      She was started on low dose toprol for HTN and anxiety, she is very resistant to medications. She did not take the metoprolol however she did increase water, started walking, started devotion, meditation and states she feels much better.   She has history of vascular migraine, episodes in the 80's, but had abnormal vision and HA in the last month- after discussing the abnormal vision further she denies complete loss of vision but states on the left side she could not comprehend what she was looking at. She then followed up with her orthodonist and had Xray that showed left root infection and going to get treatment that may have  caused this pain.   She had a normal B12, normal zinc, normal iron, TSH, RPR, tick borne illness are normal. She did have an elevated homocystine.   BP Readings from Last 3 Encounters:  12/05/20 (!) 162/70  11/14/20 (!) 152/90  07/19/20 (!) 154/82   She is not on cholesterol medication, her last check she was not to goal. Lab Results  Component Value Date   CHOL 250 (H) 11/14/2020   HDL 35 (L) 11/14/2020   LDLCALC 170 (H) 11/14/2020   TRIG 274 (H) 11/14/2020   CHOLHDL 7.1 (H) 11/14/2020   She  is working on diet and exercise for abnormal glucose.  Lab Results  Component Value Date   HGBA1C 5.4 11/14/2020         Screening Tests: Immunization History  Administered Date(s) Administered   Pneumococcal-Unspecified 03/05/1997   Td 03/05/1998   Last colonoscopy: 2021 Dr. Silverio Decamp- repeat 3 years EGD 05/21/2019 Last mammogram: 07/2013 normal, DUE will call for an appointment Last pap smear/pelvic exam: 2010 , declines another DEXA: 6-7 years, normal, declines another Hep panel neg 2013 [place] AB 02/2019-1. Chronic cholelithiasis without evidence of acute cholecystitis. 2. Coarse echotexture of the liver with slight lobulation of the liver suggesting the possibility of cirrhosis.   US neck 2013 CXR 04/2008   Prior vaccinations: TD or Tdap: 2000, DUE  Influenza: declines       Pneumococcal: 1999 Prevnar 13: declines Shingles: declines COVID: declines    Names of Other Physician/Practitioners you currently use: 1. [facility] here for primary care Last Dental Exam: Dr. Gloriann Loan, 2021, had extraction and is going to have another Last Eye Exam: Dr. Delman Cheadle, wears glasses, 2021 Patient Care Team: Sharyne Peach, MD as Consulting Physician (Ophthalmology) Earley Favor (Dentistry)  Patient Active Problem List   Diagnosis Date Noted   Fatty liver 09/05/2016   Poor compliance with medical recommendations 04/29/2014   Vitamin D deficiency 06/10/2013   Medication management 06/10/2013   GERD    Hyperlipidemia    Abnormal glucose    Hypertension       Current Outpatient Medications (Cardiovascular):    metoprolol succinate (TOPROL XL) 25 MG 24 hr tablet, Take 1 tablet (25 mg total) by mouth at bedtime. (Patient not taking: Reported on 12/05/2020)     Current Outpatient Medications (Other):    ALPRAZolam (XANAX) 0.25 MG tablet, Take 1/2 to 1 tablet 3 x daily as need for anxiety   betamethasone dipropionate (DIPROLENE) 0.05 %  ointment, Apply topically 2 (two) times daily.   Bevacizumab (AVASTIN IV), Inject into the vein. Every 4 weeks   Cholecalciferol (D3 PO), Take 5,000 Units by mouth daily.   Zinc 30 MG TABS, Take by mouth.  Allergies  Allergen Reactions   Ampicillin Hives and Swelling   Aspirin Itching   Doxycycline Itching and Swelling   Macrodantin [Nitrofurantoin Macrocrystal] Hives, Itching and Swelling   Percocet [Oxycodone-Acetaminophen]    Propulsid [Cisapride] Itching and Swelling   Voltaren [Diclofenac] Hives and Itching   Codeine Palpitations   Penicillins Swelling and Rash    ROS: all negative except above.   Physical Exam: There were no vitals filed for this visit.  There were no vitals taken for this visit. General Appearance: Well nourished, in no apparent distress. Eyes: PERRLA, EOMs, conjunctiva no swelling or erythema Sinuses: No Frontal/maxillary tenderness ENT/Mouth: Ext aud canals clear, TMs without erythema, bulging. No erythema, swelling, or exudate on post pharynx.  Tonsils not swollen or erythematous. Hearing  normal.  Neck: Supple, thyroid normal.  Respiratory: Respiratory effort normal, BS equal bilaterally without rales, rhonchi, wheezing or stridor.  Cardio: RRR with no MRGs. Brisk peripheral pulses without edema.  Abdomen: Soft, + BS.  Non tender, no guarding, rebound, hernias, masses. Lymphatics: Non tender without lymphadenopathy.  Musculoskeletal: Full ROM, 5/5 strength, normal gait.  Skin: Warm, dry without rashes, lesions, ecchymosis.  Neuro: Cranial nerves intact. Normal muscle tone, no cerebellar symptoms. Sensation intact.  Psych: Awake and oriented X 3, normal affect, Insight and Judgment appropriate.     Magda Bernheim ANP-C  [facility]  02/14/2021

## 2021-02-16 ENCOUNTER — Encounter: Payer: Medicare PPO | Admitting: Nurse Practitioner

## 2021-02-16 DIAGNOSIS — F419 Anxiety disorder, unspecified: Secondary | ICD-10-CM

## 2021-02-16 DIAGNOSIS — Z136 Encounter for screening for cardiovascular disorders: Secondary | ICD-10-CM

## 2021-02-16 DIAGNOSIS — R0989 Other specified symptoms and signs involving the circulatory and respiratory systems: Secondary | ICD-10-CM

## 2021-02-16 DIAGNOSIS — Z79899 Other long term (current) drug therapy: Secondary | ICD-10-CM

## 2021-02-16 DIAGNOSIS — R7309 Other abnormal glucose: Secondary | ICD-10-CM

## 2021-02-16 DIAGNOSIS — G250 Essential tremor: Secondary | ICD-10-CM

## 2021-02-16 DIAGNOSIS — D649 Anemia, unspecified: Secondary | ICD-10-CM

## 2021-02-16 DIAGNOSIS — E7211 Homocystinuria: Secondary | ICD-10-CM

## 2021-02-16 DIAGNOSIS — Z1389 Encounter for screening for other disorder: Secondary | ICD-10-CM

## 2021-02-16 DIAGNOSIS — Z0001 Encounter for general adult medical examination with abnormal findings: Secondary | ICD-10-CM

## 2021-02-16 DIAGNOSIS — E782 Mixed hyperlipidemia: Secondary | ICD-10-CM

## 2021-02-16 DIAGNOSIS — K2100 Gastro-esophageal reflux disease with esophagitis, without bleeding: Secondary | ICD-10-CM

## 2021-02-16 DIAGNOSIS — E559 Vitamin D deficiency, unspecified: Secondary | ICD-10-CM

## 2021-02-16 DIAGNOSIS — R7989 Other specified abnormal findings of blood chemistry: Secondary | ICD-10-CM

## 2021-02-17 ENCOUNTER — Encounter (INDEPENDENT_AMBULATORY_CARE_PROVIDER_SITE_OTHER): Payer: Medicare PPO | Admitting: Ophthalmology

## 2021-02-21 ENCOUNTER — Encounter (INDEPENDENT_AMBULATORY_CARE_PROVIDER_SITE_OTHER): Payer: Medicare PPO | Admitting: Ophthalmology

## 2021-02-21 ENCOUNTER — Other Ambulatory Visit: Payer: Self-pay

## 2021-02-21 DIAGNOSIS — H318 Other specified disorders of choroid: Secondary | ICD-10-CM

## 2021-02-21 DIAGNOSIS — H35033 Hypertensive retinopathy, bilateral: Secondary | ICD-10-CM

## 2021-02-21 DIAGNOSIS — I10 Essential (primary) hypertension: Secondary | ICD-10-CM

## 2021-02-21 DIAGNOSIS — H353111 Nonexudative age-related macular degeneration, right eye, early dry stage: Secondary | ICD-10-CM

## 2021-02-21 DIAGNOSIS — H43813 Vitreous degeneration, bilateral: Secondary | ICD-10-CM | POA: Diagnosis not present

## 2021-03-13 DIAGNOSIS — L245 Irritant contact dermatitis due to other chemical products: Secondary | ICD-10-CM | POA: Diagnosis not present

## 2021-03-13 DIAGNOSIS — L821 Other seborrheic keratosis: Secondary | ICD-10-CM | POA: Diagnosis not present

## 2021-03-13 DIAGNOSIS — L649 Androgenic alopecia, unspecified: Secondary | ICD-10-CM | POA: Diagnosis not present

## 2021-03-21 ENCOUNTER — Other Ambulatory Visit: Payer: Self-pay

## 2021-03-21 ENCOUNTER — Encounter (INDEPENDENT_AMBULATORY_CARE_PROVIDER_SITE_OTHER): Payer: Medicare PPO | Admitting: Ophthalmology

## 2021-03-21 ENCOUNTER — Encounter: Payer: Medicare PPO | Admitting: Nurse Practitioner

## 2021-03-21 DIAGNOSIS — H35033 Hypertensive retinopathy, bilateral: Secondary | ICD-10-CM | POA: Diagnosis not present

## 2021-03-21 DIAGNOSIS — H318 Other specified disorders of choroid: Secondary | ICD-10-CM

## 2021-03-21 DIAGNOSIS — I10 Essential (primary) hypertension: Secondary | ICD-10-CM

## 2021-03-21 DIAGNOSIS — H43813 Vitreous degeneration, bilateral: Secondary | ICD-10-CM | POA: Diagnosis not present

## 2021-03-21 DIAGNOSIS — H2513 Age-related nuclear cataract, bilateral: Secondary | ICD-10-CM | POA: Diagnosis not present

## 2021-03-28 NOTE — Progress Notes (Signed)
COMPLETE PHYSICAL Assessment and Plan:  Margaret Sullivan was seen today for annual exam.  Diagnoses and all orders for this visit:  Encounter for general adult medical examination with abnormal findings Due yearly, will call for mammogram to get scheduled   Labile hypertension She does not wish to start blood pressure medication, counseled on dangers of untreated hypertension - continue DASH diet, exercise and monitor at home. Call if greater than 130/80.  Start Dr. Burman Nieves diet for hypertension and hyperlipidemia and will reevaluate - Go to the ER if any chest pain, shortness of breath, nausea, dizziness, severe HA, changes vision/speech Follow up in 5 weeks, if BP not improved will start medication -     CBC with Differential/Platelet  Mixed hyperlipidemia Continue diet and exercise  -     COMPLETE METABOLIC PANEL WITH GFR -     Lipid panel -     TSH  Anxiety Continue behavior modifications Xanax prn  Abnormal glucose Continue diet and exercise -     Hemoglobin A1c  Anemia, unspecified type Monitor -     CBC with Differential/Platelet -     Iron, Total/Total Iron Binding Cap -     Vitamin B12 -     Ferritin  Vitamin D deficiency -     VITAMIN D 25 Hydroxy (Vit-D Deficiency, Fractures)  Medication management -     Magnesium  Gastroesophageal reflux disease with esophagitis without hemorrhage Continue dietary modifications and monitor -     Magnesium  Fatty liver Recheck LFT's Limit Tylenol use Continue diet and exercise  Screening for hematuria or proteinuria -     Urinalysis, Routine w reflex microscopic -     Microalbumin / creatinine urine ratio  Screening, ischemic heart disease -     EKG 12-Lead  Screening for thyroid disorder -     TSH  Hair loss -     Iron, Total/Total Iron Binding Cap -     Vitamin B12 -     Ferritin  Subretinal hemorrhage, left Continue to follow with retina specialist      Discussed med's effects and SE's. Screening labs and  tests as requested with regular follow-up as recommended. Over 30 minutes of exam, counseling, chart review, and complex, high level critical decision making was performed this visit.  Future Appointments  Date Time Provider [place]  04/26/2021 12:45 PM Hayden Pedro, MD [facility] None  03/29/2022 10:00 AM Magda Bernheim, NP [facility] None    HPI  76 y.o. female  presents for CPE and follow up.   She continues to have issues with hair loss. She believes it has gotten worse. It has been present since July when she had Covid.  Also believes she is not getting more mental clarity since Covid.   BMI is Body mass index is 26.16 kg/m. She has been focusing on diet . Has started today the How not to die diet for blood pressure and cholesterol- she is not eating out. She has not been exercising.  Wt Readings from Last 3 Encounters:  03/29/21 152 lb 6.4 oz (69.1 kg)  12/05/20 150 lb 12.8 oz (68.4 kg)  11/14/20 151 lb 3.2 oz (68.6 kg)   She never started taking her Metoprolol ,  BP at home was 140-150/70's.  She has noted her BP's have been going up since having to get the eye injections- attributes to anxiety. Also had a death in the family. Today their BP is BP: (!) 152/78   BP Readings  from Last 3 Encounters:  03/29/21 (!) 152/78  12/05/20 (!) 162/70  11/14/20 (!) 152/90     She does workout. She denies chest pain, shortness of breath, dizziness.  She is not on cholesterol medication, she declines statins.  Her cholesterol is not at goal. The cholesterol last visit was:   Lab Results  Component Value Date   CHOL 250 (H) 11/14/2020   HDL 35 (L) 11/14/2020   LDLCALC 170 (H) 11/14/2020   TRIG 274 (H) 11/14/2020   CHOLHDL 7.1 (H) 11/14/2020   She has been working on diet and exercise for prediabetes, and denies paresthesia of the feet, polydipsia, polyuria and visual disturbances. Last A1C in the office was:  Lab Results  Component Value Date   HGBA1C 5.4 11/14/2020   Patient  is on Vitamin D supplement, started back 5000 IU a day.   Lab Results  Component Value Date   VD25OH 71 11/14/2020   Retired Pharmacist, hospital.   She has had elevated LFTs.  Lab Results  Component Value Date   ALT 41 (H) 11/14/2020   AST 53 (H) 11/14/2020   ALKPHOS 75 04/08/2019   BILITOT 0.6 11/14/2020    Current Medications:  Current Outpatient Medications on File Prior to Visit  Medication Sig Dispense Refill   ALPRAZolam (XANAX) 0.25 MG tablet Take 1/2 to 1 tablet 3 x daily as need for anxiety 60 tablet 0   betamethasone dipropionate (DIPROLENE) 0.05 % ointment Apply topically 2 (two) times daily.     Bevacizumab (AVASTIN IV) Inject into the vein. Every 4 weeks     Cholecalciferol (D3 PO) Take 5,000 Units by mouth daily.     metoprolol succinate (TOPROL XL) 25 MG 24 hr tablet Take 1 tablet (25 mg total) by mouth at bedtime. (Patient not taking: Reported on 12/05/2020) 30 tablet 11   Zinc 30 MG TABS Take by mouth. (Patient not taking: Reported on 03/29/2021)     No current facility-administered medications on file prior to visit.   Health Maintenance:   Immunization History  Administered Date(s) Administered   Pneumococcal-Unspecified 03/05/1997   Td 03/05/1998   Last colonoscopy: 05/21/19 due 2024 Last mammogram: 07/2013 normal, DUE will call for an appointment Last pap smear/pelvic exam: 2010 , declines another DEXA: 6-7 years, normal, declines another Hep panel neg 2013 [place] AB 2017, + gallstone, no thickening, + fatty liver US neck 2013 CXR 04/2008  Prior vaccinations: TD or Tdap: 2000, DUE  Influenza: declines       Pneumococcal: 1999 Prevnar 13: declines Shingles/Zostavax: declines  Last Dental Exam: Dr. Gloriann Loan, 2022, had extraction and is going to have another Last Eye Exam: Dr. Delman Cheadle, wears glasses, 2022 peripheral vitreous hemorrhage left eye Patient Care Team: Unk Pinto, MD as PCP - General (Internal Medicine) Sharyne Peach, MD as Consulting Physician  (Ophthalmology) Earley Favor (Dentistry) Delfina Redwood as Referring Physician (Physician Assistant) Vladimir Crofts, PA-C as Referring Physician (Physician Assistant)  Medical History:  Past Medical History:  Diagnosis Date   Allergy    Essential tremor    GERD (gastroesophageal reflux disease)    Heart murmur    High cholesterol    Hyperlipidemia    Hypertension    labile   Labile hypertension    Prediabetes    Allergies Allergies  Allergen Reactions   Ampicillin Hives and Swelling   Aspirin Itching   Doxycycline Itching and Swelling   Macrodantin [Nitrofurantoin Macrocrystal] Hives, Itching and Swelling   Percocet [Oxycodone-Acetaminophen]  Propulsid [Cisapride] Itching and Swelling   Voltaren [Diclofenac] Hives and Itching   Codeine Palpitations   Penicillins Swelling and Rash    SURGICAL HISTORY She  has a past surgical history that includes Back surgery (2001); Upper gastrointestinal endoscopy; Colonoscopy; Tonsillectomy and adenoidectomy (Bilateral, 1950); and Wisdom tooth extraction (Bilateral, 1969). FAMILY HISTORY Her family history includes Breast cancer in her father; Colon cancer in her maternal uncle; Heart attack in her father; Lymphoma in her mother. SOCIAL HISTORY She  reports that she has never smoked. She has never used smokeless tobacco. She reports that she does not drink alcohol and does not use drugs.   Review of Systems: Review of Systems  Constitutional: Negative.  Negative for chills and fever.  HENT: Negative.  Negative for congestion, hearing loss, sinus pain, sore throat and tinnitus.   Eyes: Negative.  Negative for blurred vision and double vision.  Respiratory: Negative.  Negative for cough, hemoptysis, sputum production, shortness of breath and wheezing.   Cardiovascular: Negative.  Negative for chest pain, palpitations and leg swelling.  Gastrointestinal:  Negative for abdominal pain, blood in stool, constipation, diarrhea,  heartburn, melena, nausea and vomiting.  Genitourinary: Negative.  Negative for dysuria, flank pain, frequency, hematuria and urgency.  Musculoskeletal: Negative.  Negative for back pain, falls, joint pain, myalgias and neck pain.  Skin: Negative.  Negative for rash.  Neurological: Negative.  Negative for dizziness, tingling, tremors, weakness and headaches.  Endo/Heme/Allergies:  Does not bruise/bleed easily.  Psychiatric/Behavioral: Negative.  Negative for depression and suicidal ideas. The patient is not nervous/anxious and does not have insomnia.    Physical Exam: Estimated body mass index is 26.16 kg/m as calculated from the following:   Height as of this encounter: 5' 4"  (1.626 m).   Weight as of this encounter: 152 lb 6.4 oz (69.1 kg). BP (!) 152/78    Pulse 80    Temp (!) 97.5 F (36.4 C)    Ht 5' 4"  (1.626 m)    Wt 152 lb 6.4 oz (69.1 kg)    SpO2 96%    BMI 26.16 kg/m  General Appearance: Well nourished, in no apparent distress.  Eyes: PERRLA, EOMs, conjunctiva no swelling or erythema, normal fundi and vessels.  Sinuses: No Frontal/maxillary tenderness  ENT/Mouth: Ext aud canals clear, normal light reflex with TMs without erythema, bulging. Good dentition. No erythema, swelling, or exudate on post pharynx. Tonsils not swollen or erythematous. Hearing normal.  Neck: Supple, thyroid normal. No bruits  Respiratory: Respiratory effort normal, BS equal bilaterally without rales, rhonchi, wheezing or stridor.  Cardio: RRR without murmurs, rubs or gallops. Brisk peripheral pulses without edema.  Chest: symmetric, with normal excursions and percussion.  Breasts: Deferred Abdomen: Soft, slight upper AB distention however nontender, no guarding, rebound, hernias, masses, or organomegaly.  Lymphatics: Non tender without lymphadenopathy.  Genitourinary: defer Musculoskeletal: Full ROM all peripheral extremities,5/5 strength, and normal gait.  Skin: Warm, dry without rashes, lesions,  ecchymosis. Neuro: Cranial nerves intact, reflexes equal bilaterally. Normal muscle tone, no cerebellar symptoms. Sensation intact.  Psych: Awake and oriented X 3, normal affect, Insight and Judgment appropriate.   EKG IRBBB, no ST changes  Teven Mittman W Damiana Berrian 10:09 AM [facility]

## 2021-03-29 ENCOUNTER — Ambulatory Visit: Payer: Medicare PPO | Admitting: Nurse Practitioner

## 2021-03-29 ENCOUNTER — Encounter: Payer: Self-pay | Admitting: Nurse Practitioner

## 2021-03-29 ENCOUNTER — Other Ambulatory Visit: Payer: Self-pay

## 2021-03-29 VITALS — BP 152/78 | HR 80 | Temp 97.5°F | Ht 64.0 in | Wt 152.4 lb

## 2021-03-29 DIAGNOSIS — E782 Mixed hyperlipidemia: Secondary | ICD-10-CM | POA: Diagnosis not present

## 2021-03-29 DIAGNOSIS — Z0000 Encounter for general adult medical examination without abnormal findings: Secondary | ICD-10-CM | POA: Diagnosis not present

## 2021-03-29 DIAGNOSIS — K2100 Gastro-esophageal reflux disease with esophagitis, without bleeding: Secondary | ICD-10-CM | POA: Diagnosis not present

## 2021-03-29 DIAGNOSIS — Z136 Encounter for screening for cardiovascular disorders: Secondary | ICD-10-CM

## 2021-03-29 DIAGNOSIS — R7309 Other abnormal glucose: Secondary | ICD-10-CM

## 2021-03-29 DIAGNOSIS — L659 Nonscarring hair loss, unspecified: Secondary | ICD-10-CM | POA: Diagnosis not present

## 2021-03-29 DIAGNOSIS — E559 Vitamin D deficiency, unspecified: Secondary | ICD-10-CM

## 2021-03-29 DIAGNOSIS — D649 Anemia, unspecified: Secondary | ICD-10-CM | POA: Diagnosis not present

## 2021-03-29 DIAGNOSIS — Z1329 Encounter for screening for other suspected endocrine disorder: Secondary | ICD-10-CM

## 2021-03-29 DIAGNOSIS — H3562 Retinal hemorrhage, left eye: Secondary | ICD-10-CM

## 2021-03-29 DIAGNOSIS — F419 Anxiety disorder, unspecified: Secondary | ICD-10-CM

## 2021-03-29 DIAGNOSIS — Z1389 Encounter for screening for other disorder: Secondary | ICD-10-CM

## 2021-03-29 DIAGNOSIS — R0989 Other specified symptoms and signs involving the circulatory and respiratory systems: Secondary | ICD-10-CM

## 2021-03-29 DIAGNOSIS — Z0001 Encounter for general adult medical examination with abnormal findings: Secondary | ICD-10-CM

## 2021-03-29 DIAGNOSIS — K760 Fatty (change of) liver, not elsewhere classified: Secondary | ICD-10-CM

## 2021-03-29 DIAGNOSIS — E7211 Homocystinuria: Secondary | ICD-10-CM | POA: Diagnosis not present

## 2021-03-29 DIAGNOSIS — Z79899 Other long term (current) drug therapy: Secondary | ICD-10-CM | POA: Diagnosis not present

## 2021-03-29 NOTE — Patient Instructions (Signed)

## 2021-03-30 LAB — COMPLETE METABOLIC PANEL WITH GFR
AG Ratio: 1.5 (calc) (ref 1.0–2.5)
ALT: 45 U/L — ABNORMAL HIGH (ref 6–29)
AST: 59 U/L — ABNORMAL HIGH (ref 10–35)
Albumin: 4.8 g/dL (ref 3.6–5.1)
Alkaline phosphatase (APISO): 75 U/L (ref 37–153)
BUN: 12 mg/dL (ref 7–25)
CO2: 30 mmol/L (ref 20–32)
Calcium: 9.7 mg/dL (ref 8.6–10.4)
Chloride: 101 mmol/L (ref 98–110)
Creat: 0.82 mg/dL (ref 0.60–1.00)
Globulin: 3.2 g/dL (calc) (ref 1.9–3.7)
Glucose, Bld: 95 mg/dL (ref 65–99)
Potassium: 4.4 mmol/L (ref 3.5–5.3)
Sodium: 138 mmol/L (ref 135–146)
Total Bilirubin: 0.8 mg/dL (ref 0.2–1.2)
Total Protein: 8 g/dL (ref 6.1–8.1)
eGFR: 75 mL/min/{1.73_m2} (ref >=60)

## 2021-03-30 LAB — CBC WITH DIFFERENTIAL/PLATELET
Absolute Monocytes: 778 cells/uL (ref 200–950)
Basophils Absolute: 61 cells/uL (ref 0–200)
Basophils Relative: 0.6 %
Eosinophils Absolute: 242 cells/uL (ref 15–500)
Eosinophils Relative: 2.4 %
HCT: 41.4 % (ref 35.0–45.0)
Hemoglobin: 14 g/dL (ref 11.7–15.5)
Lymphs Abs: 2172 cells/uL (ref 850–3900)
MCH: 30.9 pg (ref 27.0–33.0)
MCHC: 33.8 g/dL (ref 32.0–36.0)
MCV: 91.4 fL (ref 80.0–100.0)
MPV: 10.3 fL (ref 7.5–12.5)
Monocytes Relative: 7.7 %
Neutro Abs: 6848 cells/uL (ref 1500–7800)
Neutrophils Relative %: 67.8 %
Platelets: 224 10*3/uL (ref 140–400)
RBC: 4.53 10*6/uL (ref 3.80–5.10)
RDW: 12.4 % (ref 11.0–15.0)
Total Lymphocyte: 21.5 %
WBC: 10.1 10*3/uL (ref 3.8–10.8)

## 2021-03-30 LAB — IRON, TOTAL/TOTAL IRON BINDING CAP
%SAT: 33 % (calc) (ref 16–45)
Iron: 123 ug/dL (ref 45–160)
TIBC: 369 mcg/dL (calc) (ref 250–450)

## 2021-03-30 LAB — URINALYSIS, ROUTINE W REFLEX MICROSCOPIC
Bilirubin Urine: NEGATIVE
Glucose, UA: NEGATIVE
Hgb urine dipstick: NEGATIVE
Ketones, ur: NEGATIVE
Leukocytes,Ua: NEGATIVE
Nitrite: NEGATIVE
Protein, ur: NEGATIVE
Specific Gravity, Urine: 1.008 (ref 1.001–1.035)
pH: 6.5 (ref 5.0–8.0)

## 2021-03-30 LAB — VITAMIN B12: Vitamin B-12: 396 pg/mL (ref 200–1100)

## 2021-03-30 LAB — VITAMIN D 25 HYDROXY (VIT D DEFICIENCY, FRACTURES): Vit D, 25-Hydroxy: 39 ng/mL (ref 30–100)

## 2021-03-30 LAB — LIPID PANEL
Cholesterol: 247 mg/dL — ABNORMAL HIGH (ref <200)
HDL: 38 mg/dL — ABNORMAL LOW (ref >=50)
LDL Cholesterol (Calc): 172 mg/dL (calc) — ABNORMAL HIGH
Non-HDL Cholesterol (Calc): 209 mg/dL (calc) — ABNORMAL HIGH (ref <130)
Total CHOL/HDL Ratio: 6.5 (calc) — ABNORMAL HIGH (ref <5.0)
Triglycerides: 205 mg/dL — ABNORMAL HIGH (ref <150)

## 2021-03-30 LAB — HEMOGLOBIN A1C
Hgb A1c MFr Bld: 5.7 % of total Hgb — ABNORMAL HIGH (ref <5.7)
Mean Plasma Glucose: 117 mg/dL
eAG (mmol/L): 6.5 mmol/L

## 2021-03-30 LAB — MICROALBUMIN / CREATININE URINE RATIO
Creatinine, Urine: 52 mg/dL (ref 20–275)
Microalb Creat Ratio: 19 mcg/mg creat (ref <30)
Microalb, Ur: 1 mg/dL

## 2021-03-30 LAB — MAGNESIUM: Magnesium: 2.4 mg/dL (ref 1.5–2.5)

## 2021-03-30 LAB — FERRITIN: Ferritin: 255 ng/mL (ref 16–288)

## 2021-03-30 LAB — TSH: TSH: 2.95 mIU/L (ref 0.40–4.50)

## 2021-03-31 NOTE — Progress Notes (Signed)
PT IS AWARE OF RESULTS AND RECOMMENDATIONS, HARD COPY MAILED OUT Monday 04/03/21

## 2021-04-26 ENCOUNTER — Other Ambulatory Visit: Payer: Self-pay

## 2021-04-26 ENCOUNTER — Encounter (INDEPENDENT_AMBULATORY_CARE_PROVIDER_SITE_OTHER): Payer: Medicare PPO | Admitting: Ophthalmology

## 2021-04-26 DIAGNOSIS — H2513 Age-related nuclear cataract, bilateral: Secondary | ICD-10-CM

## 2021-04-26 DIAGNOSIS — H43813 Vitreous degeneration, bilateral: Secondary | ICD-10-CM

## 2021-04-26 DIAGNOSIS — H35033 Hypertensive retinopathy, bilateral: Secondary | ICD-10-CM

## 2021-04-26 DIAGNOSIS — H318 Other specified disorders of choroid: Secondary | ICD-10-CM | POA: Diagnosis not present

## 2021-04-26 DIAGNOSIS — I10 Essential (primary) hypertension: Secondary | ICD-10-CM | POA: Diagnosis not present

## 2021-05-02 NOTE — Progress Notes (Deleted)
Assessment and Plan: ? ?There are no diagnoses linked to this encounter. ? ? ? ?Further disposition pending results of labs. Discussed med's effects and SE's.   ?Over 30 minutes of exam, counseling, chart review, and critical decision making was performed.  ? ?Future Appointments  ?Date Time Provider [place]  ?05/03/2021 11:00 AM Magda Bernheim, NP [facility] None  ?05/31/2021 12:30 PM Hayden Pedro, MD [facility] None  ?03/29/2022 10:00 AM Sharicka Pogorzelski, Townsend Roger, NP [facility] None  ? ? ?------------------------------------------------------------------------------------------------------------------ ? ? ?HPI ?There were no vitals taken for this visit. ?76 y.o.female presents for ? ?Past Medical History:  ?Diagnosis Date  ? Allergy   ? Essential tremor   ? GERD (gastroesophageal reflux disease)   ? Heart murmur   ? High cholesterol   ? Hyperlipidemia   ? Hypertension   ? labile  ? Labile hypertension   ? Prediabetes   ?  ? ?Allergies  ?Allergen Reactions  ? Ampicillin Hives and Swelling  ? Aspirin Itching  ? Doxycycline Itching and Swelling  ? Macrodantin [Nitrofurantoin Macrocrystal] Hives, Itching and Swelling  ? Percocet [Oxycodone-Acetaminophen]   ? Propulsid [Cisapride] Itching and Swelling  ? Voltaren [Diclofenac] Hives and Itching  ? Codeine Palpitations  ? Penicillins Swelling and Rash  ? ? ?Current Outpatient Medications on File Prior to Visit  ?Medication Sig  ? ALPRAZolam (XANAX) 0.25 MG tablet Take 1/2 to 1 tablet 3 x daily as need for anxiety  ? betamethasone dipropionate (DIPROLENE) 0.05 % ointment Apply topically 2 (two) times daily.  ? Bevacizumab (AVASTIN IV) Inject into the vein. Every 4 weeks  ? Cholecalciferol (D3 PO) Take 5,000 Units by mouth daily.  ? metoprolol succinate (TOPROL XL) 25 MG 24 hr tablet Take 1 tablet (25 mg total) by mouth at bedtime. (Patient not taking: Reported on 12/05/2020)  ? Zinc 30 MG TABS Take by mouth. (Patient not taking: Reported on 03/29/2021)  ? ?No current  facility-administered medications on file prior to visit.  ? ? ?ROS: all negative except above.  ? ?Physical Exam: ? ?There were no vitals taken for this visit. ? ?General Appearance: Well nourished, in no apparent distress. ?Eyes: PERRLA, EOMs, conjunctiva no swelling or erythema ?Sinuses: No Frontal/maxillary tenderness ?ENT/Mouth: Ext aud canals clear, TMs without erythema, bulging. No erythema, swelling, or exudate on post pharynx.  Tonsils not swollen or erythematous. Hearing normal.  ?Neck: Supple, thyroid normal.  ?Respiratory: Respiratory effort normal, BS equal bilaterally without rales, rhonchi, wheezing or stridor.  ?Cardio: RRR with no MRGs. Brisk peripheral pulses without edema.  ?Abdomen: Soft, + BS.  Non tender, no guarding, rebound, hernias, masses. ?Lymphatics: Non tender without lymphadenopathy.  ?Musculoskeletal: Full ROM, 5/5 strength, normal gait.  ?Skin: Warm, dry without rashes, lesions, ecchymosis.  ?Neuro: Cranial nerves intact. Normal muscle tone, no cerebellar symptoms. Sensation intact.  ?Psych: Awake and oriented X 3, normal affect, Insight and Judgment appropriate.  ?  ? ?Magda Bernheim, NP ?9:10 AM ?[facility] ? ?

## 2021-05-03 ENCOUNTER — Ambulatory Visit: Payer: Medicare PPO | Admitting: Nurse Practitioner

## 2021-05-25 IMAGING — US US ABDOMEN COMPLETE
1 series · 13 of 25 positions shown · non-contrast
Comparison: Abdominal ultrasound dated 08/23/2015

CLINICAL DATA: Abdominal bloating.  Right upper quadrant pain.

EXAM:
ABDOMEN ULTRASOUND COMPLETE

[Series 1: us abdomen complete · 0.19mm/px · 13 of 96 slices shown]
[im 1/96]
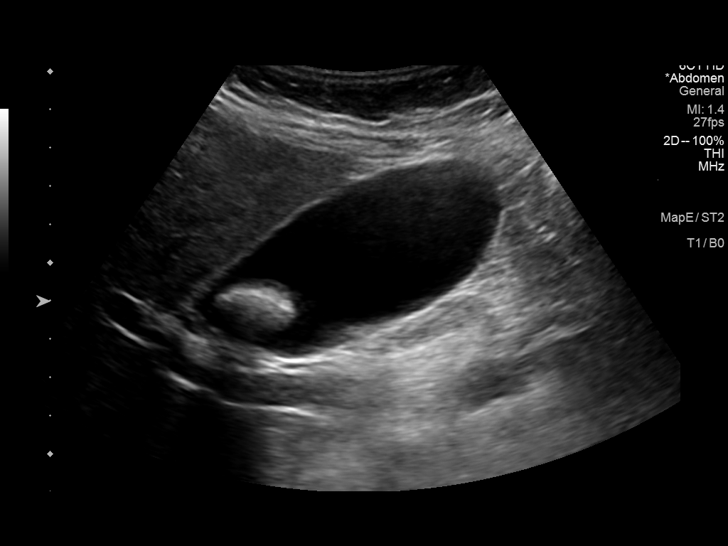
[im 8/96]
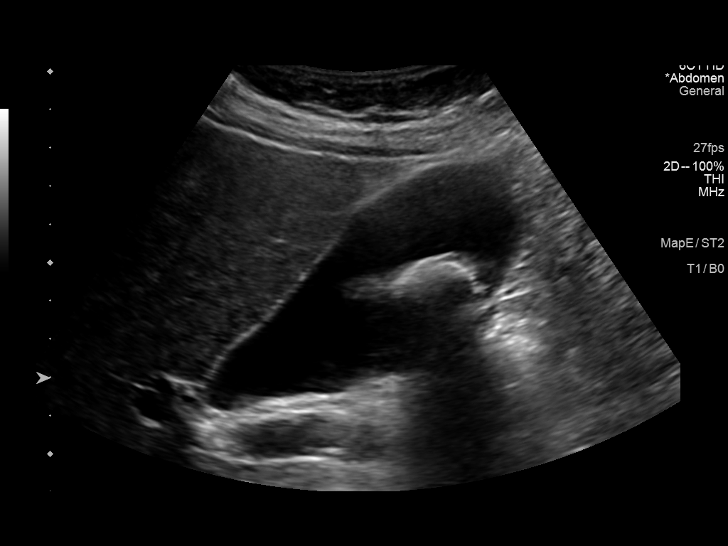
[im 16/96]
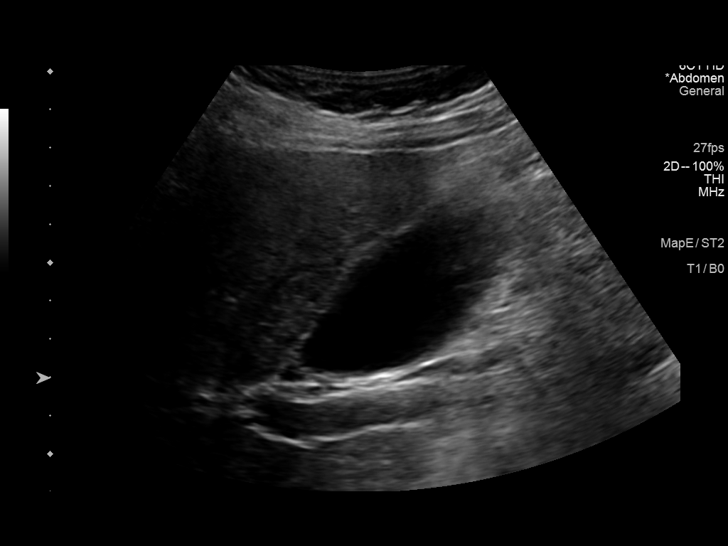
[im 24/96]
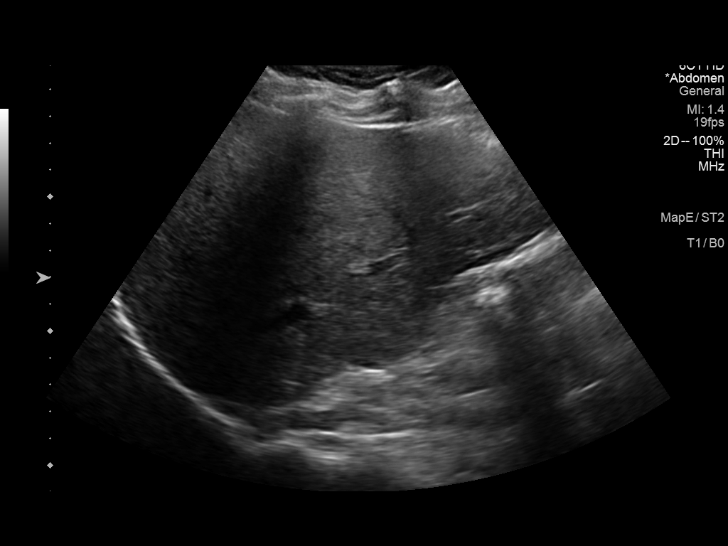
[im 32/96]
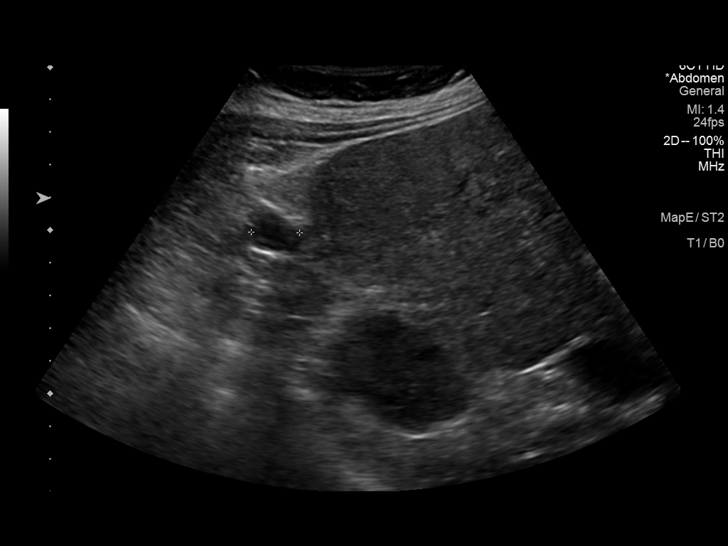
[im 40/96]
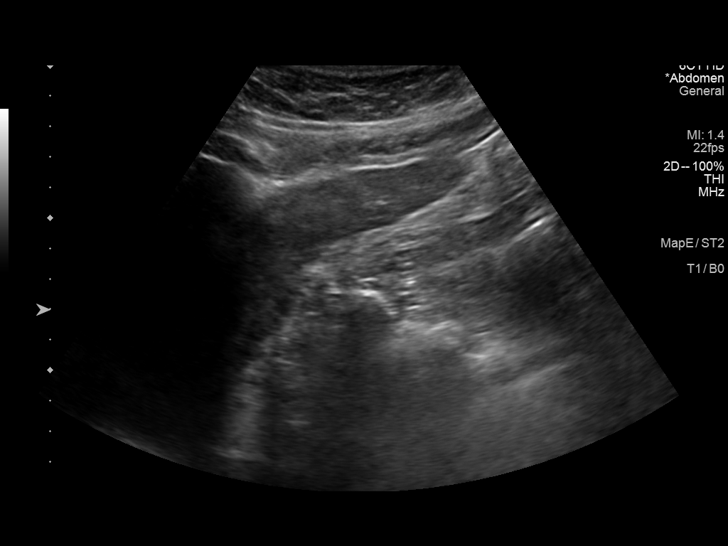
[im 48/96]
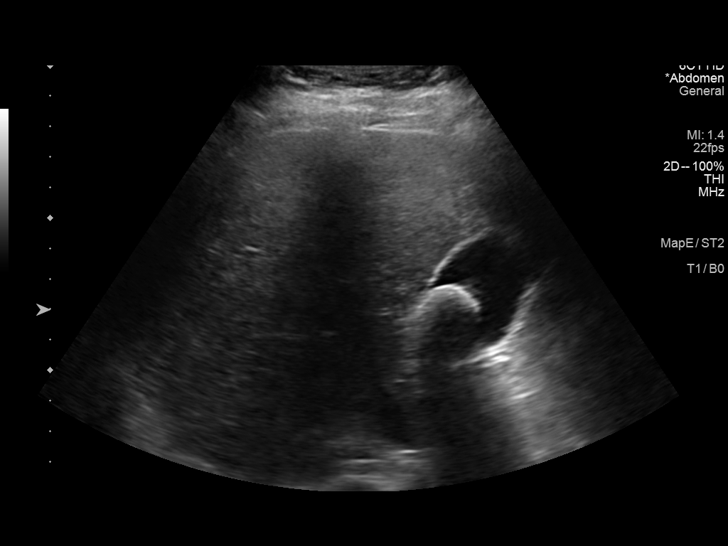
[im 56/96]
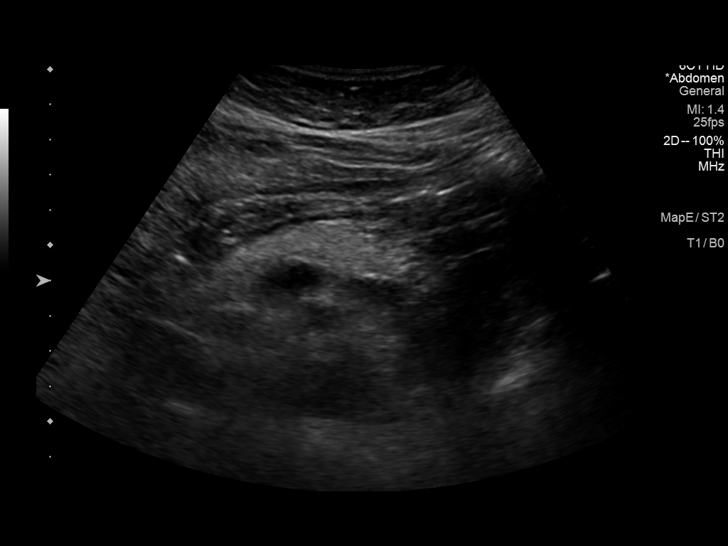
[im 64/96]
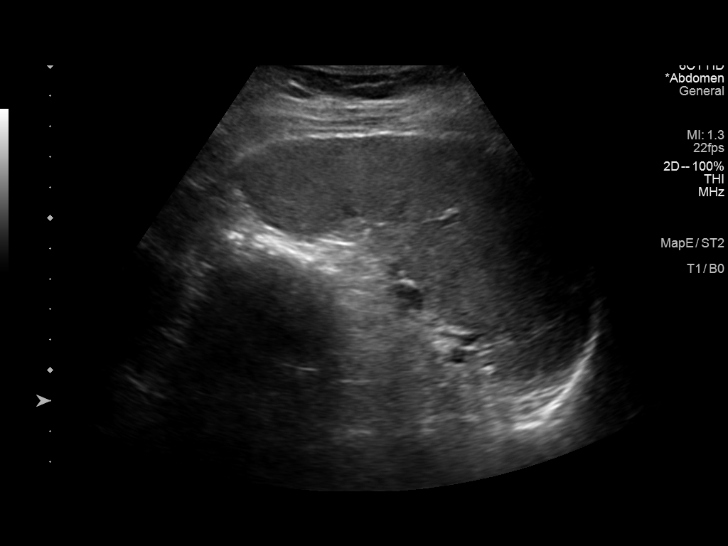
[im 72/96]
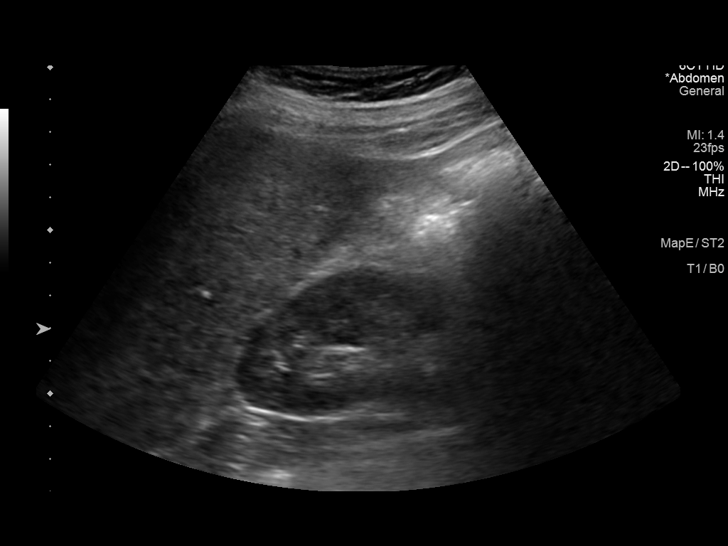
[im 80/96]
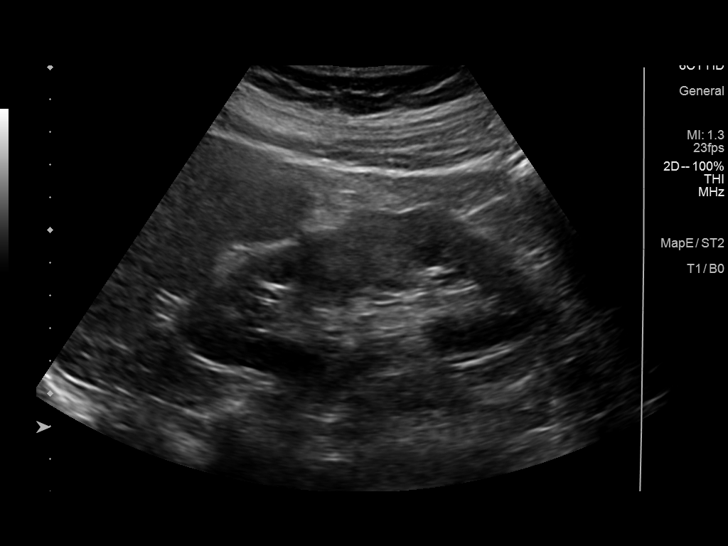
[im 88/96]
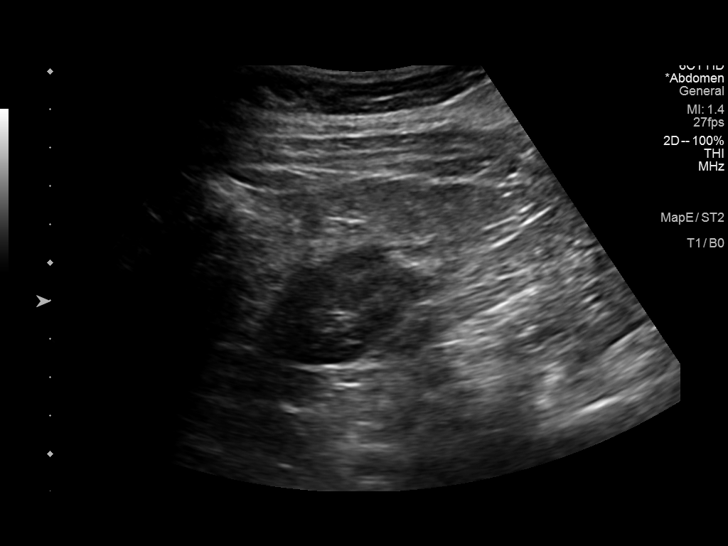
[im 96/96]
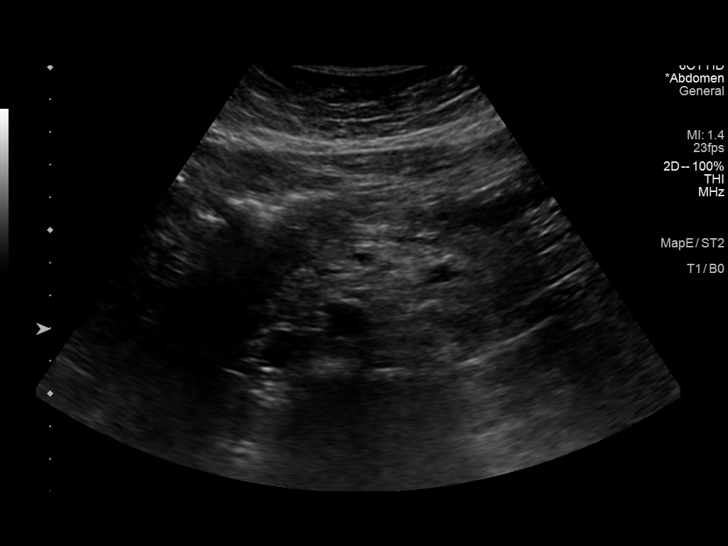

[13 of 25 positions shown; findings below may reference images not displayed]

FINDINGS: Gallbladder: Single 2.6 cm stone in the gallbladder, slightly larger
than on the prior study. No gallbladder wall thickening. Negative
sonographic Murphy's sign.

Common bile duct: Diameter: 5.2 mm, normal.

Liver: 1.5 cm cystic area on the anterior inferior margin of the
liver consistent with a benign liver cyst. Echotexture of the liver
is coarse with slight lobulation of the liver contour which can be
seen with cirrhosis. Portal vein is patent on color Doppler imaging
with normal direction of blood flow towards the liver.

IVC: No abnormality visualized.

Pancreas: Visualized portion unremarkable.

Spleen: Size and appearance within normal limits.

Right Kidney: Length: 10.6 cm. Echogenicity within normal limits. No
mass or hydronephrosis visualized.

Left Kidney: Length: 11.5 cm. Echogenicity within normal limits. No
mass or hydronephrosis visualized.

Abdominal aorta: No aneurysm visualized. Slight aortic
atherosclerosis.

Other findings: None.
IMPRESSION: 1. Chronic cholelithiasis without evidence of acute cholecystitis.
2. Coarse echotexture of the liver with slight lobulation of the
liver suggesting the possibility of cirrhosis.

## 2021-05-31 ENCOUNTER — Encounter (INDEPENDENT_AMBULATORY_CARE_PROVIDER_SITE_OTHER): Payer: Medicare PPO | Admitting: Ophthalmology

## 2021-05-31 DIAGNOSIS — H2513 Age-related nuclear cataract, bilateral: Secondary | ICD-10-CM | POA: Diagnosis not present

## 2021-05-31 DIAGNOSIS — I10 Essential (primary) hypertension: Secondary | ICD-10-CM | POA: Diagnosis not present

## 2021-05-31 DIAGNOSIS — H35033 Hypertensive retinopathy, bilateral: Secondary | ICD-10-CM

## 2021-05-31 DIAGNOSIS — H318 Other specified disorders of choroid: Secondary | ICD-10-CM

## 2021-07-12 ENCOUNTER — Encounter (INDEPENDENT_AMBULATORY_CARE_PROVIDER_SITE_OTHER): Payer: Medicare PPO | Admitting: Ophthalmology

## 2021-07-12 DIAGNOSIS — H318 Other specified disorders of choroid: Secondary | ICD-10-CM | POA: Diagnosis not present

## 2021-07-12 DIAGNOSIS — I10 Essential (primary) hypertension: Secondary | ICD-10-CM | POA: Diagnosis not present

## 2021-07-12 DIAGNOSIS — H35033 Hypertensive retinopathy, bilateral: Secondary | ICD-10-CM

## 2021-07-12 DIAGNOSIS — H2513 Age-related nuclear cataract, bilateral: Secondary | ICD-10-CM | POA: Diagnosis not present

## 2021-07-12 DIAGNOSIS — H43813 Vitreous degeneration, bilateral: Secondary | ICD-10-CM | POA: Diagnosis not present

## 2021-07-23 DIAGNOSIS — L988 Other specified disorders of the skin and subcutaneous tissue: Secondary | ICD-10-CM | POA: Diagnosis not present

## 2021-07-23 DIAGNOSIS — S0086XA Insect bite (nonvenomous) of other part of head, initial encounter: Secondary | ICD-10-CM | POA: Diagnosis not present

## 2021-07-23 DIAGNOSIS — R030 Elevated blood-pressure reading, without diagnosis of hypertension: Secondary | ICD-10-CM | POA: Diagnosis not present

## 2021-07-23 DIAGNOSIS — W57XXXA Bitten or stung by nonvenomous insect and other nonvenomous arthropods, initial encounter: Secondary | ICD-10-CM | POA: Diagnosis not present

## 2021-07-26 ENCOUNTER — Ambulatory Visit: Payer: Medicare PPO | Admitting: Nurse Practitioner

## 2021-07-26 ENCOUNTER — Encounter: Payer: Self-pay | Admitting: Nurse Practitioner

## 2021-07-26 VITALS — BP 158/72 | HR 84 | Temp 97.7°F | Wt 152.4 lb

## 2021-07-26 DIAGNOSIS — R7309 Other abnormal glucose: Secondary | ICD-10-CM | POA: Diagnosis not present

## 2021-07-26 DIAGNOSIS — R0989 Other specified symptoms and signs involving the circulatory and respiratory systems: Secondary | ICD-10-CM | POA: Diagnosis not present

## 2021-07-26 DIAGNOSIS — Z79899 Other long term (current) drug therapy: Secondary | ICD-10-CM

## 2021-07-26 DIAGNOSIS — E663 Overweight: Secondary | ICD-10-CM | POA: Diagnosis not present

## 2021-07-26 DIAGNOSIS — E782 Mixed hyperlipidemia: Secondary | ICD-10-CM

## 2021-07-26 DIAGNOSIS — W57XXXA Bitten or stung by nonvenomous insect and other nonvenomous arthropods, initial encounter: Secondary | ICD-10-CM

## 2021-07-26 DIAGNOSIS — K760 Fatty (change of) liver, not elsewhere classified: Secondary | ICD-10-CM | POA: Diagnosis not present

## 2021-07-26 MED ORDER — DOXYCYCLINE HYCLATE 100 MG PO CAPS: 100.0000 mg | ORAL_CAPSULE | Freq: Two times a day (BID) | ORAL | 0 refills | Status: DC

## 2021-07-26 NOTE — Progress Notes (Signed)
Assessment and Plan:  Margaret Sullivan was seen today for acute visit.  Diagnoses and all orders for this visit:  Mixed hyperlipidemia Continue to work on diet and exercise Discussed medication management but pt would like to continue with lifestyle modifications -     COMPLETE METABOLIC PANEL WITH GFR -     Lipid panel  Fatty liver Continue diet and exercise.  Limit tylenol and alcohol -     COMPLETE METABOLIC PANEL WITH GFR  Labile hypertension Poor compliance with medical recommendations Counseled in depth on dangers of untreated hypertension Prefers lifestyle approach and will do research on health topics - continue  DASH diet, exercise and monitor at home. Call if greater than 130/80.   Go to the ER if any chest pain, shortness of breath, nausea, dizziness, severe HA, changes vision/speech  -     CBC with Differential/Platelet  Abnormal glucose Continue diet and exercise  Medication management Continued  Overweight (BMI 25.0-29.9) Long discussion about weight loss, diet, and exercise Recommended diet heavy in fruits and veggies and low in animal meats, cheeses, and dairy products, appropriate calorie intake Follow up at next visit   Tick bite of head, unspecified part, initial encounter Monitor site, if develops worsening rash, fever- notify the office Had nausea with doxycycline previously- advised to take with full meal, if unable to take notify the office -     doxycycline (VIBRAMYCIN) 100 MG capsule; Take 1 capsule (100 mg total) by mouth 2 (two) times daily.        Further disposition pending results of labs. Discussed med's effects and SE's.   Over 30 minutes of exam, counseling, chart review, and critical decision making was performed.   Future Appointments  Date Time Provider [place]  08/23/2021 12:30 PM Hayden Pedro, MD [facility] None  03/29/2022 10:00 AM Magda Bernheim, NP [facility] None     ------------------------------------------------------------------------------------------------------------------   HPI BP (!) 158/72   Pulse 84   Temp 97.7 F (36.5 C)   Wt 152 lb 6.4 oz (69.1 kg)   SpO2 97%   BMI 26.16 kg/m   75 y.o.female presents for tick bite on her jaw which occurred 3 days ago. She states the tick had a white dot(lonestar tick). She was in the yard weeding and came in and found a tick on her jaw. It was attached for several hours. He released on his own with the use of essential oils. Went to Urgent care.  They prescribed her Desonide.   She has had a lot of stress. 7 yo aunt died.  Cousin was hospitalized and she stayed there the month of 05-09-2022- he then passed away with Covid. He was buried 05/14/21. She now is taking care of his 40 1/2 yo dog Production designer, theatre/television/film). She is now more active taking care of the dog.    Was prescribed Metoprolol in the past but is not taking the medication. Has been counseled on dangers of untreated blood pressure. BP at home 120/60's BP Readings from Last 3 Encounters:  07/26/21 (!) 158/72  03/29/21 (!) 152/78  12/05/20 (!) 162/70   BMI is Body mass index is 26.16 kg/m., she has been working on diet and exercise. Wt Readings from Last 3 Encounters:  07/26/21 152 lb 6.4 oz (69.1 kg)  03/29/21 152 lb 6.4 oz (69.1 kg)  12/05/20 150 lb 12.8 oz (68.4 kg)   She does have abnormal glucose and has been working on diet and exercise Lab Results  Component Value Date  HGBA1C 5.7 (H) 03/29/2021    She has a history of fatty liver. Last ALT was: Lab Results  Component Value Date   ALT 45 (H) 03/29/2021     She is not currently on cholesterol medication, was recommended but she wanted to try more lifestyle changes before initiating medication. Lab Results  Component Value Date   CHOL 247 (H) 03/29/2021   HDL 38 (L) 03/29/2021   LDLCALC 172 (H) 03/29/2021   TRIG 205 (H) 03/29/2021   CHOLHDL 6.5 (H) 03/29/2021     Past  Medical History:  Diagnosis Date   Allergy    Essential tremor    GERD (gastroesophageal reflux disease)    Heart murmur    High cholesterol    Hyperlipidemia    Hypertension    labile   Labile hypertension    Prediabetes      Allergies  Allergen Reactions   Ampicillin Hives and Swelling   Aspirin Itching   Doxycycline Itching and Swelling   Macrodantin [Nitrofurantoin Macrocrystal] Hives, Itching and Swelling   Percocet [Oxycodone-Acetaminophen]    Propulsid [Cisapride] Itching and Swelling   Voltaren [Diclofenac] Hives and Itching   Codeine Palpitations   Penicillins Swelling and Rash    Current Outpatient Medications on File Prior to Visit  Medication Sig   ALPRAZolam (XANAX) 0.25 MG tablet Take 1/2 to 1 tablet 3 x daily as need for anxiety   betamethasone dipropionate (DIPROLENE) 0.05 % ointment Apply topically 2 (two) times daily.   Bevacizumab (AVASTIN IV) Inject into the vein. Every 4 weeks   Cholecalciferol (D3 PO) Take 5,000 Units by mouth daily.   metoprolol succinate (TOPROL XL) 25 MG 24 hr tablet Take 1 tablet (25 mg total) by mouth at bedtime. (Patient not taking: Reported on 12/05/2020)   Zinc 30 MG TABS Take by mouth. (Patient not taking: Reported on 03/29/2021)   No current facility-administered medications on file prior to visit.    ROS: all negative except above.   Physical Exam:  BP (!) 158/72   Pulse 84   Temp 97.7 F (36.5 C)   Wt 152 lb 6.4 oz (69.1 kg)   SpO2 97%   BMI 26.16 kg/m   General Appearance: Well nourished, in no apparent distress. Eyes: PERRLA, EOMs, conjunctiva no swelling or erythema Sinuses: No Frontal/maxillary tenderness ENT/Mouth: Ext aud canals clear, TMs without erythema, bulging. No erythema, swelling, or exudate on post pharynx.  Tonsils not swollen or erythematous. Hearing normal.  Neck: Supple, thyroid normal.  Respiratory: Respiratory effort normal, BS equal bilaterally without rales, rhonchi, wheezing or stridor.   Cardio: RRR with no MRGs. Brisk peripheral pulses without edema.  Abdomen: Soft, + BS.  Non tender, no guarding, rebound, hernias, masses. Lymphatics: Non tender without lymphadenopathy.  Musculoskeletal: Full ROM, 5/5 strength, normal gait.  Skin: Warm, dry . Reddened slightly swollen area on jaw- no head embedded.  Neuro: Cranial nerves intact. Normal muscle tone, no cerebellar symptoms. Sensation intact.  Psych: Awake and oriented X 3, normal affect, Insight and Judgment appropriate.     Magda Bernheim, NP 3:10 PM [facility]

## 2021-07-27 LAB — CBC WITH DIFFERENTIAL/PLATELET
Absolute Monocytes: 896 cells/uL (ref 200–950)
Basophils Absolute: 54 cells/uL (ref 0–200)
Basophils Relative: 0.5 %
Eosinophils Absolute: 270 cells/uL (ref 15–500)
Eosinophils Relative: 2.5 %
HCT: 39.5 % (ref 35.0–45.0)
Hemoglobin: 14 g/dL (ref 11.7–15.5)
Lymphs Abs: 2527 cells/uL (ref 850–3900)
MCH: 32.6 pg (ref 27.0–33.0)
MCHC: 35.4 g/dL (ref 32.0–36.0)
MCV: 92.1 fL (ref 80.0–100.0)
MPV: 10.3 fL (ref 7.5–12.5)
Monocytes Relative: 8.3 %
Neutro Abs: 7052 cells/uL (ref 1500–7800)
Neutrophils Relative %: 65.3 %
Platelets: 214 10*3/uL (ref 140–400)
RBC: 4.29 10*6/uL (ref 3.80–5.10)
RDW: 12.3 % (ref 11.0–15.0)
Total Lymphocyte: 23.4 %
WBC: 10.8 10*3/uL (ref 3.8–10.8)

## 2021-07-27 LAB — COMPLETE METABOLIC PANEL WITH GFR
AG Ratio: 1.9 (calc) (ref 1.0–2.5)
ALT: 40 U/L — ABNORMAL HIGH (ref 6–29)
AST: 50 U/L — ABNORMAL HIGH (ref 10–35)
Albumin: 5.2 g/dL — ABNORMAL HIGH (ref 3.6–5.1)
Alkaline phosphatase (APISO): 76 U/L (ref 37–153)
BUN: 15 mg/dL (ref 7–25)
CO2: 26 mmol/L (ref 20–32)
Calcium: 9.7 mg/dL (ref 8.6–10.4)
Chloride: 104 mmol/L (ref 98–110)
Creat: 0.87 mg/dL (ref 0.60–1.00)
Globulin: 2.8 g/dL (calc) (ref 1.9–3.7)
Glucose, Bld: 92 mg/dL (ref 65–99)
Potassium: 4.4 mmol/L (ref 3.5–5.3)
Sodium: 140 mmol/L (ref 135–146)
Total Bilirubin: 0.7 mg/dL (ref 0.2–1.2)
Total Protein: 8 g/dL (ref 6.1–8.1)
eGFR: 69 mL/min/{1.73_m2} (ref >=60)

## 2021-07-27 LAB — LIPID PANEL
Cholesterol: 232 mg/dL — ABNORMAL HIGH (ref <200)
HDL: 33 mg/dL — ABNORMAL LOW (ref >=50)
LDL Cholesterol (Calc): 160 mg/dL (calc) — ABNORMAL HIGH
Non-HDL Cholesterol (Calc): 199 mg/dL (calc) — ABNORMAL HIGH (ref <130)
Total CHOL/HDL Ratio: 7 (calc) — ABNORMAL HIGH (ref <5.0)
Triglycerides: 219 mg/dL — ABNORMAL HIGH (ref <150)

## 2021-08-23 ENCOUNTER — Encounter (INDEPENDENT_AMBULATORY_CARE_PROVIDER_SITE_OTHER): Payer: Medicare PPO | Admitting: Ophthalmology

## 2021-08-23 DIAGNOSIS — H318 Other specified disorders of choroid: Secondary | ICD-10-CM | POA: Diagnosis not present

## 2021-08-23 DIAGNOSIS — H2513 Age-related nuclear cataract, bilateral: Secondary | ICD-10-CM

## 2021-08-23 DIAGNOSIS — H43813 Vitreous degeneration, bilateral: Secondary | ICD-10-CM | POA: Diagnosis not present

## 2021-08-23 DIAGNOSIS — H35033 Hypertensive retinopathy, bilateral: Secondary | ICD-10-CM

## 2021-08-23 DIAGNOSIS — I10 Essential (primary) hypertension: Secondary | ICD-10-CM | POA: Diagnosis not present

## 2021-09-19 NOTE — Progress Notes (Unsigned)
Assessment and Plan:  There are no diagnoses linked to this encounter.    Further disposition pending results of labs. Discussed med's effects and SE's.   Over 30 minutes of exam, counseling, chart review, and critical decision making was performed.   Future Appointments  Date Time Provider [place]  09/20/2021  2:45 PM Alycia Rossetti, NP [facility] None  10/18/2021 12:30 PM Hayden Pedro, MD [facility] None  03/29/2022 10:00 AM Alycia Rossetti, NP [facility] None    ------------------------------------------------------------------------------------------------------------------   HPI There were no vitals taken for this visit. 76 y.o.female presents for  Past Medical History:  Diagnosis Date   Allergy    Essential tremor    GERD (gastroesophageal reflux disease)    Heart murmur    High cholesterol    Hyperlipidemia    Hypertension    labile   Labile hypertension    Prediabetes      Allergies  Allergen Reactions   Ampicillin Hives and Swelling   Aspirin Itching   Doxycycline Nausea And Vomiting   Macrodantin [Nitrofurantoin Macrocrystal] Hives, Itching and Swelling   Percocet [Oxycodone-Acetaminophen]    Propulsid [Cisapride] Itching and Swelling   Voltaren [Diclofenac] Hives and Itching   Codeine Palpitations   Penicillins Swelling and Rash    Current Outpatient Medications on File Prior to Visit  Medication Sig   ALPRAZolam (XANAX) 0.25 MG tablet Take 1/2 to 1 tablet 3 x daily as need for anxiety   betamethasone dipropionate (DIPROLENE) 0.05 % ointment Apply topically 2 (two) times daily.   Bevacizumab (AVASTIN IV) Inject into the vein. Every 4 weeks   Cholecalciferol (D3 PO) Take 5,000 Units by mouth daily.   doxycycline (VIBRAMYCIN) 100 MG capsule Take 1 capsule (100 mg total) by mouth 2 (two) times daily.   No current facility-administered medications on file prior to visit.    ROS: all negative except above.   Physical Exam:  There  were no vitals taken for this visit.  General Appearance: Well nourished, in no apparent distress. Eyes: PERRLA, EOMs, conjunctiva no swelling or erythema Sinuses: No Frontal/maxillary tenderness ENT/Mouth: Ext aud canals clear, TMs without erythema, bulging. No erythema, swelling, or exudate on post pharynx.  Tonsils not swollen or erythematous. Hearing normal.  Neck: Supple, thyroid normal.  Respiratory: Respiratory effort normal, BS equal bilaterally without rales, rhonchi, wheezing or stridor.  Cardio: RRR with no MRGs. Brisk peripheral pulses without edema.  Abdomen: Soft, + BS.  Non tender, no guarding, rebound, hernias, masses. Lymphatics: Non tender without lymphadenopathy.  Musculoskeletal: Full ROM, 5/5 strength, normal gait.  Skin: Warm, dry without rashes, lesions, ecchymosis.  Neuro: Cranial nerves intact. Normal muscle tone, no cerebellar symptoms. Sensation intact.  Psych: Awake and oriented X 3, normal affect, Insight and Judgment appropriate.     Alycia Rossetti, NP 11:21 AM [facility]

## 2021-09-20 ENCOUNTER — Ambulatory Visit: Payer: Medicare PPO | Admitting: Nurse Practitioner

## 2021-09-20 ENCOUNTER — Encounter: Payer: Self-pay | Admitting: Nurse Practitioner

## 2021-09-20 VITALS — BP 152/64 | HR 82 | Temp 97.7°F | Ht 64.0 in | Wt 156.2 lb

## 2021-09-20 DIAGNOSIS — K760 Fatty (change of) liver, not elsewhere classified: Secondary | ICD-10-CM

## 2021-09-20 DIAGNOSIS — D508 Other iron deficiency anemias: Secondary | ICD-10-CM

## 2021-09-20 DIAGNOSIS — Z79899 Other long term (current) drug therapy: Secondary | ICD-10-CM | POA: Diagnosis not present

## 2021-09-20 DIAGNOSIS — I10 Essential (primary) hypertension: Secondary | ICD-10-CM | POA: Diagnosis not present

## 2021-09-20 DIAGNOSIS — E559 Vitamin D deficiency, unspecified: Secondary | ICD-10-CM | POA: Diagnosis not present

## 2021-09-20 DIAGNOSIS — E782 Mixed hyperlipidemia: Secondary | ICD-10-CM

## 2021-09-20 DIAGNOSIS — R5382 Chronic fatigue, unspecified: Secondary | ICD-10-CM | POA: Diagnosis not present

## 2021-09-20 MED ORDER — LOSARTAN POTASSIUM 25 MG PO TABS
25.0000 mg | ORAL_TABLET | Freq: Every day | ORAL | 3 refills | Status: DC
Start: 2021-09-20 — End: 2022-08-30

## 2021-09-21 ENCOUNTER — Encounter: Payer: Self-pay | Admitting: Nurse Practitioner

## 2021-09-21 LAB — CBC WITH DIFFERENTIAL/PLATELET
Absolute Monocytes: 949 cells/uL (ref 200–950)
Basophils Absolute: 47 cells/uL (ref 0–200)
Basophils Relative: 0.5 %
Eosinophils Absolute: 216 cells/uL (ref 15–500)
Eosinophils Relative: 2.3 %
HCT: 39.6 % (ref 35.0–45.0)
Hemoglobin: 13.7 g/dL (ref 11.7–15.5)
Lymphs Abs: 2106 cells/uL (ref 850–3900)
MCH: 31.3 pg (ref 27.0–33.0)
MCHC: 34.6 g/dL (ref 32.0–36.0)
MCV: 90.4 fL (ref 80.0–100.0)
MPV: 10.3 fL (ref 7.5–12.5)
Monocytes Relative: 10.1 %
Neutro Abs: 6082 cells/uL (ref 1500–7800)
Neutrophils Relative %: 64.7 %
Platelets: 221 10*3/uL (ref 140–400)
RBC: 4.38 10*6/uL (ref 3.80–5.10)
RDW: 12.4 % (ref 11.0–15.0)
Total Lymphocyte: 22.4 %
WBC: 9.4 10*3/uL (ref 3.8–10.8)

## 2021-09-21 LAB — VITAMIN B12: Vitamin B-12: 332 pg/mL (ref 200–1100)

## 2021-09-21 LAB — IRON, TOTAL/TOTAL IRON BINDING CAP
%SAT: 26 % (calc) (ref 16–45)
Iron: 101 ug/dL (ref 45–160)
TIBC: 385 mcg/dL (calc) (ref 250–450)

## 2021-09-21 LAB — TSH: TSH: 3.13 mIU/L (ref 0.40–4.50)

## 2021-09-21 LAB — VITAMIN D 25 HYDROXY (VIT D DEFICIENCY, FRACTURES): Vit D, 25-Hydroxy: 25 ng/mL — ABNORMAL LOW (ref 30–100)

## 2021-09-21 LAB — FERRITIN: Ferritin: 229 ng/mL (ref 16–288)

## 2021-09-21 NOTE — Progress Notes (Signed)
Patient is aware of labs results and instruction and copy was mailed to her per her request. -e welch

## 2021-10-18 ENCOUNTER — Encounter (INDEPENDENT_AMBULATORY_CARE_PROVIDER_SITE_OTHER): Payer: Medicare PPO | Admitting: Ophthalmology

## 2021-10-18 DIAGNOSIS — H318 Other specified disorders of choroid: Secondary | ICD-10-CM | POA: Diagnosis not present

## 2021-10-18 DIAGNOSIS — H35033 Hypertensive retinopathy, bilateral: Secondary | ICD-10-CM

## 2021-10-18 DIAGNOSIS — I10 Essential (primary) hypertension: Secondary | ICD-10-CM | POA: Diagnosis not present

## 2021-10-18 DIAGNOSIS — H43813 Vitreous degeneration, bilateral: Secondary | ICD-10-CM | POA: Diagnosis not present

## 2021-10-24 ENCOUNTER — Ambulatory Visit: Payer: Medicare PPO | Admitting: Nurse Practitioner

## 2021-11-07 NOTE — Progress Notes (Unsigned)
Medicare Annual Wellness Exam and 3 Month Follow Up Assessment and Plan:  Margaret Sullivan was seen today for annual exam.  Diagnoses and all orders for this visit:  Encounter for medicare annual wellness exam Due yearly, will call for mammogram to get scheduled   Labile hypertension She does not wish to start blood pressure medication, counseled on dangers of untreated hypertension - continue DASH diet, exercise and monitor at home. Call if greater than 130/80.  Start Dr. Burman Nieves diet for hypertension and hyperlipidemia and will reevaluate - Go to the ER if any chest pain, shortness of breath, nausea, dizziness, severe HA, changes vision/speech Follow up in 3 months -     CBC with Differential/Platelet  Mixed hyperlipidemia Continue diet and exercise  -     COMPLETE METABOLIC PANEL WITH GFR -     Lipid panel -     TSH  Anxiety Continue behavior modifications Xanax prn  Abnormal glucose Continue diet and exercise -     Hemoglobin A1c  Anemia, unspecified type Monitor -     CBC with Differential/Platelet -     Iron, Total/Total Iron Binding Cap -     Vitamin B12 -     Ferritin  Vitamin D deficiency Just restarted Vit D3 5000 units daily Will recheck Vit D at next visit  Medication management Continued  Gastroesophageal reflux disease with esophagitis without hemorrhage Continue dietary modifications and monitor  Fatty liver Recheck LFT's Limit Tylenol use Continue diet and exercise  Subretinal hemorrhage, left Continue to follow with retina specialist  Overweight Long discussion about weight loss, diet, and exercise Recommended diet heavy in fruits and veggies and low in animal meats, cheeses, and dairy products, appropriate calorie intake Patient will work on decreasing saturated fats and simple carbs, increase activity and protein Follow up at next visit      Discussed med's effects and SE's. Screening labs and tests as requested with regular follow-up as  recommended. Over 30 minutes of exam, counseling, chart review, and complex, high level critical decision making was performed this visit.  Future Appointments  Date Time Provider [place]  12/13/2021 12:30 PM Hayden Pedro, MD [facility] None  03/29/2022 10:00 AM Alycia Rossetti, NP [facility] None   Plan:   During the course of the visit the patient was educated and counseled about appropriate screening and preventive services including:   Pneumococcal vaccine  Prevnar 13 Influenza vaccine Td vaccine Screening electrocardiogram Bone densitometry screening Colorectal cancer screening Diabetes screening Glaucoma screening Nutrition counseling  Advanced directives: requested   HPI  76 y.o. female  presents for Medicare Annual Wellness Exam and  and follow up.   She does see Dr. Zigmund Daniel for subretinal hemorrhage for follow up on 12/2021  BMI is Body mass index is 26.43 kg/m. She has been focusing on diet . Has started today the How not to die diet for blood pressure and cholesterol- she has been doing juicing.  She has not been exercising.  Wt Readings from Last 3 Encounters:  11/08/21 154 lb (69.9 kg)  09/20/21 156 lb 3.2 oz (70.9 kg)  07/26/21 152 lb 6.4 oz (69.1 kg)   She never started taking her Metoprolol ,  BP 's at home are 130's/70's, she had 1 elevation of 150/70's.  She has noted her BP's have been going up since having to get the eye injections- attributes to anxiety.Today their BP is BP: (!) 142/76  . She has been counseled multiple times to start Losartan 25  mg which has been prescribed and she has not started the medication, she did pick up the medication but not taken.  BP Readings from Last 3 Encounters:  11/08/21 (!) 142/76  09/20/21 (!) 152/64  07/26/21 (!) 158/72     She does workout. She denies chest pain, shortness of breath, dizziness.  She is not on cholesterol medication, she declines statins.  Have discussed cholesterol medications in depth  with patient and has refused medications.  She has been counseled on risks of elevated cholesterol  Her cholesterol is not at goal. The cholesterol last visit was:   Lab Results  Component Value Date   CHOL 232 (H) 07/26/2021   HDL 33 (L) 07/26/2021   LDLCALC 160 (H) 07/26/2021   TRIG 219 (H) 07/26/2021   CHOLHDL 7.0 (H) 07/26/2021   She has been working on diet and exercise for prediabetes, and denies paresthesia of the feet, polydipsia, polyuria and visual disturbances. Last A1C in the office was:  Lab Results  Component Value Date   HGBA1C 5.7 (H) 03/29/2021   Patient is on Vitamin D supplement, started back 5000 IU a day.   Lab Results  Component Value Date   VD25OH 25 (L) 09/20/2021   Retired Pharmacist, hospital.   She has had elevated LFTs.  Lab Results  Component Value Date   ALT 40 (H) 07/26/2021   AST 50 (H) 07/26/2021   ALKPHOS 75 04/08/2019   BILITOT 0.7 07/26/2021    Current Medications:  Current Outpatient Medications on File Prior to Visit  Medication Sig Dispense Refill   ALPRAZolam (XANAX) 0.25 MG tablet Take 1/2 to 1 tablet 3 x daily as need for anxiety 60 tablet 0   betamethasone dipropionate (DIPROLENE) 0.05 % ointment Apply topically 2 (two) times daily.     Bevacizumab (AVASTIN IV) Inject into the vein. Every 4 weeks     Cholecalciferol (D3 PO) Take 5,000 Units by mouth daily.     losartan (COZAAR) 25 MG tablet Take 1 tablet (25 mg total) by mouth daily. (Patient not taking: Reported on 11/08/2021) 30 tablet 3   No current facility-administered medications on file prior to visit.   Health Maintenance:   Immunization History  Administered Date(s) Administered   Pneumococcal-Unspecified 03/05/1997   Td 03/05/1998   Last colonoscopy: 05/21/19 due 2024 Last mammogram: 07/2013 normal, [facility] will call for an appointment Last pap smear/pelvic exam: 2010 , declines another DEXA: 6-7 years, normal, declines another Hep panel neg 2013 [place] AB 2017, + gallstone, no  thickening, + fatty liver US neck 2013 CXR 04/2008  Prior vaccinations: TD or Tdap: 2000, [facility]  Influenza: declines       Pneumococcal: 1999 Prevnar 13: declines Shingles/Zostavax: declines  Last Dental Exam: Dr. Gloriann Loan, 2023, had extraction and is going to have another Last Eye Exam: Dr. Delman Cheadle Dr. Zigmund Daniel, wears glasses, 2023 peripheral vitreous hemorrhage left eye Patient Care Team: Unk Pinto, MD as PCP - General (Internal Medicine) Sharyne Peach, MD as Consulting Physician (Ophthalmology) Earley Favor (Dentistry) Delfina Redwood as Referring Physician (Physician Assistant) Delfina Redwood as Referring Physician (Physician Assistant)  Medical History:  Past Medical History:  Diagnosis Date   Allergy    Essential tremor    GERD (gastroesophageal reflux disease)    Heart murmur    High cholesterol    Hyperlipidemia    Hypertension    labile   Labile hypertension    Prediabetes    Allergies Allergies  Allergen Reactions   Ampicillin  Hives and Swelling   Aspirin Itching   Doxycycline Nausea And Vomiting   Macrodantin [Nitrofurantoin Macrocrystal] Hives, Itching and Swelling   Percocet [Oxycodone-Acetaminophen]    Propulsid [Cisapride] Itching and Swelling   Voltaren [Diclofenac] Hives and Itching   Codeine Palpitations   Penicillins Swelling and Rash    SURGICAL HISTORY She  has a past surgical history that includes Back surgery (2001); Upper gastrointestinal endoscopy; Colonoscopy; Tonsillectomy and adenoidectomy (Bilateral, 1950); and Wisdom tooth extraction (Bilateral, 1969). FAMILY HISTORY Her family history includes Breast cancer in her father; Colon cancer in her maternal uncle; Heart attack in her father; Lymphoma in her mother. SOCIAL HISTORY She  reports that she has never smoked. She has never used smokeless tobacco. She reports that she does not drink alcohol and does not use drugs.  MEDICARE WELLNESS OBJECTIVES: Physical activity:  Current Exercise Habits: Home exercise routine, Type of exercise: walking, Time (Minutes): 30, Frequency (Times/Week): 7, Weekly Exercise (Minutes/Week): 210, Intensity: Mild, Exercise limited by: None identified Cardiac risk factors: Cardiac Risk Factors include: dyslipidemia;hypertension;advanced age (>46mn, >>109women);sedentary lifestyle Depression/mood screen:      11/08/2021    3:03 PM  Depression screen PHQ 2/9  Decreased Interest 0  Down, Depressed, Hopeless 0  PHQ - 2 Score 0    ADLs:     11/08/2021    3:03 PM 11/14/2020    4:25 PM  In your present state of health, do you have any difficulty performing the following activities:  Hearing? 0 0  Vision? 0 0  Difficulty concentrating or making decisions? 0 0  Walking or climbing stairs? 0 0  Dressing or bathing? 0 0  Doing errands, shopping? 0 0     Cognitive Testing  Alert? Yes  Normal Appearance?Yes  Oriented to person? Yes  Place? Yes   Time? Yes  Recall of three objects?  Yes  Can perform simple calculations? Yes  Displays appropriate judgment?Yes  Can read the correct time from a watch face?Yes  EOL planning: Does Patient Have a Medical Advance Directive?: Yes Type of Advance Directive: Healthcare Power of Attorney, Living will Does patient want to make changes to medical advance directive?: No - Patient declined Copy of HLittle [place] Chart?: No - copy requested    Review of Systems: Review of Systems  Constitutional: Negative.  Negative for chills and fever.  HENT: Negative.  Negative for congestion, hearing loss, sinus pain, sore throat and tinnitus.   Eyes: Negative.  Negative for blurred vision and double vision.  Respiratory: Negative.  Negative for cough, hemoptysis, sputum production, shortness of breath and wheezing.   Cardiovascular: Negative.  Negative for chest pain, palpitations and leg swelling.  Gastrointestinal:  Negative for abdominal pain, blood in stool, constipation, diarrhea,  heartburn, melena, nausea and vomiting.  Genitourinary: Negative.  Negative for dysuria, flank pain, frequency, hematuria and urgency.  Musculoskeletal: Negative.  Negative for back pain, falls, joint pain, myalgias and neck pain.  Skin: Negative.  Negative for rash.  Neurological: Negative.  Negative for dizziness, tingling, tremors, weakness and headaches.  Endo/Heme/Allergies:  Does not bruise/bleed easily.  Psychiatric/Behavioral: Negative.  Negative for depression and suicidal ideas. The patient is not nervous/anxious and does not have insomnia.     Physical Exam: Estimated body mass index is 26.43 kg/m as calculated from the following:   Height as of this encounter: 5' 4"  (1.626 m).   Weight as of this encounter: 154 lb (69.9 kg). BP (!) 142/76   Pulse 81  Temp (!) 97.5 F (36.4 C)   Ht 5' 4"  (1.626 m)   Wt 154 lb (69.9 kg)   SpO2 97%   BMI 26.43 kg/m  General Appearance: Well nourished, in no apparent distress.  Eyes: PERRLA, EOMs, conjunctiva no swelling or erythema, normal fundi and vessels.  Sinuses: No Frontal/maxillary tenderness  ENT/Mouth: Ext aud canals clear, normal light reflex with TMs without erythema, bulging. Good dentition. No erythema, swelling, or exudate on post pharynx. Tonsils not swollen or erythematous. Hearing normal.  Neck: Supple, thyroid normal. No bruits  Respiratory: Respiratory effort normal, BS equal bilaterally without rales, rhonchi, wheezing or stridor.  Cardio: RRR without murmurs, rubs or gallops. Brisk peripheral pulses without edema.  Chest: symmetric, with normal excursions and percussion.  Breasts: Deferred Abdomen: Soft, slight upper AB distention however nontender, no guarding, rebound, hernias, masses, or organomegaly.  Lymphatics: Non tender without lymphadenopathy.  Genitourinary: defer Musculoskeletal: Full ROM all peripheral extremities,5/5 strength, and normal gait.  Skin: Warm, dry without rashes, lesions, ecchymosis. Neuro:  Cranial nerves intact, reflexes equal bilaterally. Normal muscle tone, no cerebellar symptoms. Sensation intact.  Psych: Awake and oriented X 3, normal affect, Insight and Judgment appropriate.    Medicare Attestation I have personally reviewed: The patient's medical and social history Their use of alcohol, tobacco or illicit drugs Their current medications and supplements The patient's functional ability including ADLs,fall risks, home safety risks, cognitive, and hearing and visual impairment Diet and physical activities Evidence for depression or mood disorders  The patient's weight, height, BMI, and visual acuity have been recorded in the chart.  I have made referrals, counseling, and provided education to the patient based on review of the above and I have provided the patient with a written personalized care plan for preventive services.     Belina Mandile E  3:05 PM [facility]

## 2021-11-08 ENCOUNTER — Encounter: Payer: Self-pay | Admitting: Nurse Practitioner

## 2021-11-08 ENCOUNTER — Ambulatory Visit: Payer: Medicare PPO | Admitting: Nurse Practitioner

## 2021-11-08 VITALS — BP 142/76 | HR 81 | Temp 97.5°F | Ht 64.0 in | Wt 154.0 lb

## 2021-11-08 DIAGNOSIS — H3562 Retinal hemorrhage, left eye: Secondary | ICD-10-CM

## 2021-11-08 DIAGNOSIS — I10 Essential (primary) hypertension: Secondary | ICD-10-CM

## 2021-11-08 DIAGNOSIS — Z0001 Encounter for general adult medical examination with abnormal findings: Secondary | ICD-10-CM | POA: Diagnosis not present

## 2021-11-08 DIAGNOSIS — K760 Fatty (change of) liver, not elsewhere classified: Secondary | ICD-10-CM | POA: Diagnosis not present

## 2021-11-08 DIAGNOSIS — E663 Overweight: Secondary | ICD-10-CM | POA: Diagnosis not present

## 2021-11-08 DIAGNOSIS — Z79899 Other long term (current) drug therapy: Secondary | ICD-10-CM | POA: Diagnosis not present

## 2021-11-08 DIAGNOSIS — E782 Mixed hyperlipidemia: Secondary | ICD-10-CM | POA: Diagnosis not present

## 2021-11-08 DIAGNOSIS — E559 Vitamin D deficiency, unspecified: Secondary | ICD-10-CM | POA: Diagnosis not present

## 2021-11-08 DIAGNOSIS — K2100 Gastro-esophageal reflux disease with esophagitis, without bleeding: Secondary | ICD-10-CM

## 2021-11-08 DIAGNOSIS — R6889 Other general symptoms and signs: Secondary | ICD-10-CM

## 2021-11-08 DIAGNOSIS — F419 Anxiety disorder, unspecified: Secondary | ICD-10-CM | POA: Diagnosis not present

## 2021-11-08 DIAGNOSIS — Z0000 Encounter for general adult medical examination without abnormal findings: Secondary | ICD-10-CM

## 2021-11-08 DIAGNOSIS — D649 Anemia, unspecified: Secondary | ICD-10-CM

## 2021-11-08 DIAGNOSIS — R7309 Other abnormal glucose: Secondary | ICD-10-CM | POA: Diagnosis not present

## 2021-11-08 NOTE — Addendum Note (Signed)
Addended by: Lorenda Peck E on: 11/08/2021 03:15 PM   Modules accepted: Level of Service

## 2021-11-08 NOTE — Patient Instructions (Signed)
Hypertension, Adult High blood pressure (hypertension) is when the force of blood pumping through the arteries is too strong. The arteries are the blood vessels that carry blood from the heart throughout the body. Hypertension forces the heart to work harder to pump blood and may cause arteries to become narrow or stiff. Untreated or uncontrolled hypertension can lead to a heart attack, heart failure, a stroke, kidney disease, and other problems. A blood pressure reading consists of a higher number over a lower number. Ideally, your blood pressure should be below 120/80. The first ("top") number is called the systolic pressure. It is a measure of the pressure in your arteries as your heart beats. The second ("bottom") number is called the diastolic pressure. It is a measure of the pressure in your arteries as the heart relaxes. What are the causes? The exact cause of this condition is not known. There are some conditions that result in high blood pressure. What increases the risk? Certain factors may make you more likely to develop high blood pressure. Some of these risk factors are under your control, including: Smoking. Not getting enough exercise or physical activity. Being overweight. Having too much fat, sugar, calories, or salt (sodium) in your diet. Drinking too much alcohol. Other risk factors include: Having a personal history of heart disease, diabetes, high cholesterol, or kidney disease. Stress. Having a family history of high blood pressure and high cholesterol. Having obstructive sleep apnea. Age. The risk increases with age. What are the signs or symptoms? High blood pressure may not cause symptoms. Very high blood pressure (hypertensive crisis) may cause: Headache. Fast or irregular heartbeats (palpitations). Shortness of breath. Nosebleed. Nausea and vomiting. Vision changes. Severe chest pain, dizziness, and seizures. How is this diagnosed? This condition is diagnosed by  measuring your blood pressure while you are seated, with your arm resting on a flat surface, your legs uncrossed, and your feet flat on the floor. The cuff of the blood pressure monitor will be placed directly against the skin of your upper arm at the level of your heart. Blood pressure should be measured at least twice using the same arm. Certain conditions can cause a difference in blood pressure between your right and left arms. If you have a high blood pressure reading during one visit or you have normal blood pressure with other risk factors, you may be asked to: Return on a different day to have your blood pressure checked again. Monitor your blood pressure at home for 1 week or longer. If you are diagnosed with hypertension, you may have other blood or imaging tests to help your health care provider understand your overall risk for other conditions. How is this treated? This condition is treated by making healthy lifestyle changes, such as eating healthy foods, exercising more, and reducing your alcohol intake. You may be referred for counseling on a healthy diet and physical activity. Your health care provider may prescribe medicine if lifestyle changes are not enough to get your blood pressure under control and if: Your systolic blood pressure is above 130. Your diastolic blood pressure is above 80. Your personal target blood pressure may vary depending on your medical conditions, your age, and other factors. Follow these instructions at home: Eating and drinking  Eat a diet that is high in fiber and potassium, and low in sodium, added sugar, and fat. An example of this eating plan is called the DASH diet. DASH stands for Dietary Approaches to Stop Hypertension. To eat this way: Eat  plenty of fresh fruits and vegetables. Try to fill one half of your plate at each meal with fruits and vegetables. Eat whole grains, such as whole-wheat pasta, brown rice, or whole-grain bread. Fill about one  fourth of your plate with whole grains. Eat or drink low-fat dairy products, such as skim milk or low-fat yogurt. Avoid fatty cuts of meat, processed or cured meats, and poultry with skin. Fill about one fourth of your plate with lean proteins, such as fish, chicken without skin, beans, eggs, or tofu. Avoid pre-made and processed foods. These tend to be higher in sodium, added sugar, and fat. Reduce your daily sodium intake. Many people with hypertension should eat less than 1,500 mg of sodium a day. Do not drink alcohol if: Your health care provider tells you not to drink. You are pregnant, may be pregnant, or are planning to become pregnant. If you drink alcohol: Limit how much you have to: 0-1 drink a day for women. 0-2 drinks a day for men. Know how much alcohol is in your drink. In the [place] one drink equals one 12 oz bottle of beer (355 mL), one 5 oz glass of wine (148 mL), or one 1 oz glass of hard liquor (44 mL). Lifestyle  Work with your health care provider to maintain a healthy body weight or to lose weight. Ask what an ideal weight is for you. Get at least 30 minutes of exercise that causes your heart to beat faster (aerobic exercise) most days of the week. Activities may include walking, swimming, or biking. Include exercise to strengthen your muscles (resistance exercise), such as Pilates or lifting weights, as part of your weekly exercise routine. Try to do these types of exercises for 30 minutes at least 3 days a week. Do not use any products that contain nicotine or tobacco. These products include cigarettes, chewing tobacco, and vaping devices, such as e-cigarettes. If you need help quitting, ask your health care provider. Monitor your blood pressure at home as told by your health care provider. Keep all follow-up visits. This is important. Medicines Take over-the-counter and prescription medicines only as told by your health care provider. Follow directions carefully. Blood  pressure medicines must be taken as prescribed. Do not skip doses of blood pressure medicine. Doing this puts you at risk for problems and can make the medicine less effective. Ask your health care provider about side effects or reactions to medicines that you should watch for. Contact a health care provider if you: Think you are having a reaction to a medicine you are taking. Have headaches that keep coming back (recurring). Feel dizzy. Have swelling in your ankles. Have trouble with your vision. Get help right away if you: Develop a severe headache or confusion. Have unusual weakness or numbness. Feel faint. Have severe pain in your chest or abdomen. Vomit repeatedly. Have trouble breathing. These symptoms may be an emergency. Get help right away. Call 911. Do not wait to see if the symptoms will go away. Do not drive yourself to the hospital. Summary Hypertension is when the force of blood pumping through your arteries is too strong. If this condition is not controlled, it may put you at risk for serious complications. Your personal target blood pressure may vary depending on your medical conditions, your age, and other factors. For most people, a normal blood pressure is less than 120/80. Hypertension is treated with lifestyle changes, medicines, or a combination of both. Lifestyle changes include losing weight, eating a healthy,  low-sodium diet, exercising more, and limiting alcohol. This information is not intended to replace advice given to you by your health care provider. Make sure you discuss any questions you have with your health care provider. Document Revised: 12/27/2020 Document Reviewed: 12/27/2020 Elsevier Patient Education  [place].  High Cholesterol  High cholesterol is a condition in which the blood has high levels of a white, waxy substance similar to fat (cholesterol). The liver makes all the cholesterol that the body needs. The human body needs small  amounts of cholesterol to help build cells. A person gets extra or excess cholesterol from the food that he or she eats. The blood carries cholesterol from the liver to the rest of the body. If you have high cholesterol, deposits (plaques) may build up on the walls of your arteries. Arteries are the blood vessels that carry blood away from your heart. These plaques make the arteries narrow and stiff. Cholesterol plaques increase your risk for heart attack and stroke. Work with your health care provider to keep your cholesterol levels in a healthy range. What increases the risk? The following factors may make you more likely to develop this condition: Eating foods that are high in animal fat (saturated fat) or cholesterol. Being overweight. Not getting enough exercise. A family history of high cholesterol (familial hypercholesterolemia). Use of tobacco products. Having diabetes. What are the signs or symptoms? In most cases, high cholesterol does not usually cause any symptoms. In severe cases, very high cholesterol levels can cause: Fatty bumps under the skin (xanthomas). A white or gray ring around the black center (pupil) of the eye. How is this diagnosed? This condition may be diagnosed based on the results of a blood test. If you are older than 76 years of age, your health care provider may check your cholesterol levels every 4-6 years. You may be checked more often if you have high cholesterol or other risk factors for heart disease. The blood test for cholesterol measures: "Bad" cholesterol, or LDL cholesterol. This is the main type of cholesterol that causes heart disease. The desired level is less than 100 mg/dL (2.59 mmol/L). "Good" cholesterol, or HDL cholesterol. HDL helps protect against heart disease by cleaning the arteries and carrying the LDL to the liver for processing. The desired level for HDL is 60 mg/dL (1.55 mmol/L) or higher. Triglycerides. These are fats that your body  can store or burn for energy. The desired level is less than 150 mg/dL (1.69 mmol/L). Total cholesterol. This measures the total amount of cholesterol in your blood and includes LDL, HDL, and triglycerides. The desired level is less than 200 mg/dL (5.17 mmol/L). How is this treated? Treatment for high cholesterol starts with lifestyle changes, such as diet and exercise. Diet changes. You may be asked to eat foods that have more fiber and less saturated fats or added sugar. Lifestyle changes. These may include regular exercise, maintaining a healthy weight, and quitting use of tobacco products. Medicines. These are given when diet and lifestyle changes have not worked. You may be prescribed a statin medicine to help lower your cholesterol levels. Follow these instructions at home: Eating and drinking  Eat a healthy, balanced diet. This diet includes: Daily servings of a variety of fresh, frozen, or canned fruits and vegetables. Daily servings of whole grain foods that are rich in fiber. Foods that are low in saturated fats and trans fats. These include poultry and fish without skin, lean cuts of meat, and low-fat dairy products.  A variety of fish, especially oily fish that contain omega-3 fatty acids. Aim to eat fish at least 2 times a week. Avoid foods and drinks that have added sugar. Use healthy cooking methods, such as roasting, grilling, broiling, baking, poaching, steaming, and stir-frying. Do not fry your food except for stir-frying. If you drink alcohol: Limit how much you have to: 0-1 drink a day for women who are not pregnant. 0-2 drinks a day for men. Know how much alcohol is in a drink. In the [place] one drink equals one 12 oz bottle of beer (355 mL), one 5 oz glass of wine (148 mL), or one 1 oz glass of hard liquor (44 mL). Lifestyle  Get regular exercise. Aim to exercise for a total of 150 minutes a week. Increase your activity level by doing activities such as gardening,  walking, and taking the stairs. Do not use any products that contain nicotine or tobacco. These products include cigarettes, chewing tobacco, and vaping devices, such as e-cigarettes. If you need help quitting, ask your health care provider. General instructions Take over-the-counter and prescription medicines only as told by your health care provider. Keep all follow-up visits. This is important. Where to find more information American Heart Association: www.heart.org National Heart, Lung, and Blood Institute: https://wilson-eaton.com/ Contact a health care provider if: You have trouble achieving or maintaining a healthy diet or weight. You are starting an exercise program. You are unable to stop smoking. Get help right away if: You have chest pain. You have trouble breathing. You have discomfort or pain in your jaw, neck, back, shoulder, or arm. You have any symptoms of a stroke. "BE FAST" is an easy way to remember the main warning signs of a stroke: B - Balance. Signs are dizziness, sudden trouble walking, or loss of balance. E - Eyes. Signs are trouble seeing or a sudden change in vision. F - Face. Signs are sudden weakness or numbness of the face, or the face or eyelid drooping on one side. A - Arms. Signs are weakness or numbness in an arm. This happens suddenly and usually on one side of the body. S - Speech. Signs are sudden trouble speaking, slurred speech, or trouble understanding what people say. T - Time. Time to call emergency services. Write down what time symptoms started. You have other signs of a stroke, such as: A sudden, severe headache with no known cause. Nausea or vomiting. Seizure. These symptoms may represent a serious problem that is an emergency. Do not wait to see if the symptoms will go away. Get medical help right away. Call your local emergency services (911 in the [place] Do not drive yourself to the hospital. Summary Cholesterol plaques increase your risk for  heart attack and stroke. Work with your health care provider to keep your cholesterol levels in a healthy range. Eat a healthy, balanced diet, get regular exercise, and maintain a healthy weight. Do not use any products that contain nicotine or tobacco. These products include cigarettes, chewing tobacco, and vaping devices, such as e-cigarettes. Get help right away if you have any symptoms of a stroke. This information is not intended to replace advice given to you by your health care provider. Make sure you discuss any questions you have with your health care provider. Document Revised: 05/05/2020 Document Reviewed: 04/25/2020 Elsevier Patient Education  [place].

## 2021-11-09 LAB — LIPID PANEL
Cholesterol: 222 mg/dL — ABNORMAL HIGH (ref <200)
HDL: 33 mg/dL — ABNORMAL LOW (ref >=50)
LDL Cholesterol (Calc): 155 mg/dL (calc) — ABNORMAL HIGH
Non-HDL Cholesterol (Calc): 189 mg/dL (calc) — ABNORMAL HIGH (ref <130)
Total CHOL/HDL Ratio: 6.7 (calc) — ABNORMAL HIGH (ref <5.0)
Triglycerides: 205 mg/dL — ABNORMAL HIGH (ref <150)

## 2021-11-09 LAB — COMPLETE METABOLIC PANEL WITH GFR
AG Ratio: 1.6 (calc) (ref 1.0–2.5)
ALT: 43 U/L — ABNORMAL HIGH (ref 6–29)
AST: 51 U/L — ABNORMAL HIGH (ref 10–35)
Albumin: 5.1 g/dL (ref 3.6–5.1)
Alkaline phosphatase (APISO): 81 U/L (ref 37–153)
BUN: 15 mg/dL (ref 7–25)
CO2: 26 mmol/L (ref 20–32)
Calcium: 9.8 mg/dL (ref 8.6–10.4)
Chloride: 101 mmol/L (ref 98–110)
Creat: 0.84 mg/dL (ref 0.60–1.00)
Globulin: 3.1 g/dL (calc) (ref 1.9–3.7)
Glucose, Bld: 84 mg/dL (ref 65–99)
Potassium: 4.3 mmol/L (ref 3.5–5.3)
Sodium: 139 mmol/L (ref 135–146)
Total Bilirubin: 0.6 mg/dL (ref 0.2–1.2)
Total Protein: 8.2 g/dL — ABNORMAL HIGH (ref 6.1–8.1)
eGFR: 72 mL/min/{1.73_m2} (ref >=60)

## 2021-11-09 LAB — CBC WITH DIFFERENTIAL/PLATELET
Absolute Monocytes: 770 cells/uL (ref 200–950)
Basophils Absolute: 52 cells/uL (ref 0–200)
Basophils Relative: 0.5 %
Eosinophils Absolute: 208 cells/uL (ref 15–500)
Eosinophils Relative: 2 %
HCT: 42.9 % (ref 35.0–45.0)
Hemoglobin: 14.7 g/dL (ref 11.7–15.5)
Lymphs Abs: 2590 cells/uL (ref 850–3900)
MCH: 31.6 pg (ref 27.0–33.0)
MCHC: 34.3 g/dL (ref 32.0–36.0)
MCV: 92.3 fL (ref 80.0–100.0)
MPV: 10.6 fL (ref 7.5–12.5)
Monocytes Relative: 7.4 %
Neutro Abs: 6781 cells/uL (ref 1500–7800)
Neutrophils Relative %: 65.2 %
Platelets: 226 10*3/uL (ref 140–400)
RBC: 4.65 10*6/uL (ref 3.80–5.10)
RDW: 12.3 % (ref 11.0–15.0)
Total Lymphocyte: 24.9 %
WBC: 10.4 10*3/uL (ref 3.8–10.8)

## 2021-11-09 LAB — HEMOGLOBIN A1C
Hgb A1c MFr Bld: 5.5 % of total Hgb (ref <5.7)
Mean Plasma Glucose: 111 mg/dL
eAG (mmol/L): 6.2 mmol/L

## 2021-11-09 LAB — IRON, TOTAL/TOTAL IRON BINDING CAP
%SAT: 35 % (calc) (ref 16–45)
Iron: 135 ug/dL (ref 45–160)
TIBC: 390 mcg/dL (calc) (ref 250–450)

## 2021-11-09 LAB — FERRITIN: Ferritin: 192 ng/mL (ref 16–288)

## 2021-11-09 LAB — TSH: TSH: 3.55 mIU/L (ref 0.40–4.50)

## 2021-11-13 NOTE — Progress Notes (Signed)
PATIENT IS AWARE OF LAB RESULTS AND INSTRUCTIONS. COPY OF RESULTS HAVE BEEN MAILED TO PATIENT PER HER REQUEST. -E WELCH

## 2021-12-13 ENCOUNTER — Encounter (INDEPENDENT_AMBULATORY_CARE_PROVIDER_SITE_OTHER): Payer: Medicare PPO | Admitting: Ophthalmology

## 2021-12-13 DIAGNOSIS — H318 Other specified disorders of choroid: Secondary | ICD-10-CM

## 2021-12-13 DIAGNOSIS — I10 Essential (primary) hypertension: Secondary | ICD-10-CM | POA: Diagnosis not present

## 2021-12-13 DIAGNOSIS — H35033 Hypertensive retinopathy, bilateral: Secondary | ICD-10-CM

## 2021-12-13 DIAGNOSIS — H43813 Vitreous degeneration, bilateral: Secondary | ICD-10-CM

## 2022-02-21 ENCOUNTER — Encounter (INDEPENDENT_AMBULATORY_CARE_PROVIDER_SITE_OTHER): Payer: Medicare PPO | Admitting: Ophthalmology

## 2022-02-21 DIAGNOSIS — I10 Essential (primary) hypertension: Secondary | ICD-10-CM

## 2022-02-21 DIAGNOSIS — H35033 Hypertensive retinopathy, bilateral: Secondary | ICD-10-CM | POA: Diagnosis not present

## 2022-02-21 DIAGNOSIS — H318 Other specified disorders of choroid: Secondary | ICD-10-CM | POA: Diagnosis not present

## 2022-02-21 DIAGNOSIS — H43813 Vitreous degeneration, bilateral: Secondary | ICD-10-CM

## 2022-03-28 NOTE — Progress Notes (Unsigned)
Pt had complaints of covid/flu/cold symptoms at check in and was sent back to her car for a covid and flu test.  Pt called back and states she thought that was ridiculous and left.  Will be charged $25 for same day cancel

## 2022-03-29 ENCOUNTER — Ambulatory Visit: Payer: Medicare PPO | Admitting: Nurse Practitioner

## 2022-03-29 ENCOUNTER — Other Ambulatory Visit: Payer: Self-pay

## 2022-03-29 DIAGNOSIS — Z1329 Encounter for screening for other suspected endocrine disorder: Secondary | ICD-10-CM

## 2022-03-29 DIAGNOSIS — I10 Essential (primary) hypertension: Secondary | ICD-10-CM

## 2022-03-29 DIAGNOSIS — Z1389 Encounter for screening for other disorder: Secondary | ICD-10-CM

## 2022-03-29 DIAGNOSIS — R7309 Other abnormal glucose: Secondary | ICD-10-CM

## 2022-03-29 DIAGNOSIS — H3562 Retinal hemorrhage, left eye: Secondary | ICD-10-CM

## 2022-03-29 DIAGNOSIS — F419 Anxiety disorder, unspecified: Secondary | ICD-10-CM

## 2022-03-29 DIAGNOSIS — E782 Mixed hyperlipidemia: Secondary | ICD-10-CM

## 2022-03-29 DIAGNOSIS — E663 Overweight: Secondary | ICD-10-CM

## 2022-03-29 DIAGNOSIS — K760 Fatty (change of) liver, not elsewhere classified: Secondary | ICD-10-CM

## 2022-03-29 DIAGNOSIS — D649 Anemia, unspecified: Secondary | ICD-10-CM

## 2022-03-29 DIAGNOSIS — E559 Vitamin D deficiency, unspecified: Secondary | ICD-10-CM

## 2022-03-29 DIAGNOSIS — Z79899 Other long term (current) drug therapy: Secondary | ICD-10-CM

## 2022-03-29 DIAGNOSIS — Z0001 Encounter for general adult medical examination with abnormal findings: Secondary | ICD-10-CM

## 2022-03-29 DIAGNOSIS — Z136 Encounter for screening for cardiovascular disorders: Secondary | ICD-10-CM

## 2022-03-29 DIAGNOSIS — K2100 Gastro-esophageal reflux disease with esophagitis, without bleeding: Secondary | ICD-10-CM

## 2022-04-24 DIAGNOSIS — H524 Presbyopia: Secondary | ICD-10-CM | POA: Diagnosis not present

## 2022-04-24 DIAGNOSIS — H2513 Age-related nuclear cataract, bilateral: Secondary | ICD-10-CM | POA: Diagnosis not present

## 2022-04-30 DIAGNOSIS — L308 Other specified dermatitis: Secondary | ICD-10-CM | POA: Diagnosis not present

## 2022-04-30 DIAGNOSIS — L2089 Other atopic dermatitis: Secondary | ICD-10-CM | POA: Diagnosis not present

## 2022-05-02 ENCOUNTER — Encounter (INDEPENDENT_AMBULATORY_CARE_PROVIDER_SITE_OTHER): Payer: Medicare PPO | Admitting: Ophthalmology

## 2022-05-02 DIAGNOSIS — H43813 Vitreous degeneration, bilateral: Secondary | ICD-10-CM

## 2022-05-02 DIAGNOSIS — H318 Other specified disorders of choroid: Secondary | ICD-10-CM

## 2022-05-02 DIAGNOSIS — B399 Histoplasmosis, unspecified: Secondary | ICD-10-CM

## 2022-05-02 DIAGNOSIS — H35033 Hypertensive retinopathy, bilateral: Secondary | ICD-10-CM | POA: Diagnosis not present

## 2022-05-02 DIAGNOSIS — I10 Essential (primary) hypertension: Secondary | ICD-10-CM

## 2022-06-26 ENCOUNTER — Encounter: Payer: Self-pay | Admitting: Nurse Practitioner

## 2022-06-26 ENCOUNTER — Ambulatory Visit (INDEPENDENT_AMBULATORY_CARE_PROVIDER_SITE_OTHER): Payer: Medicare PPO | Admitting: Nurse Practitioner

## 2022-06-26 VITALS — BP 146/80 | HR 79 | Temp 97.7°F | Ht 63.5 in | Wt 153.2 lb

## 2022-06-26 DIAGNOSIS — Z91199 Patient's noncompliance with other medical treatment and regimen due to unspecified reason: Secondary | ICD-10-CM

## 2022-06-26 DIAGNOSIS — Z136 Encounter for screening for cardiovascular disorders: Secondary | ICD-10-CM | POA: Diagnosis not present

## 2022-06-26 DIAGNOSIS — Z0000 Encounter for general adult medical examination without abnormal findings: Secondary | ICD-10-CM

## 2022-06-26 DIAGNOSIS — K2100 Gastro-esophageal reflux disease with esophagitis, without bleeding: Secondary | ICD-10-CM

## 2022-06-26 DIAGNOSIS — D649 Anemia, unspecified: Secondary | ICD-10-CM | POA: Diagnosis not present

## 2022-06-26 DIAGNOSIS — E559 Vitamin D deficiency, unspecified: Secondary | ICD-10-CM

## 2022-06-26 DIAGNOSIS — I10 Essential (primary) hypertension: Secondary | ICD-10-CM | POA: Diagnosis not present

## 2022-06-26 DIAGNOSIS — Z79899 Other long term (current) drug therapy: Secondary | ICD-10-CM | POA: Diagnosis not present

## 2022-06-26 DIAGNOSIS — R7309 Other abnormal glucose: Secondary | ICD-10-CM

## 2022-06-26 DIAGNOSIS — Z1329 Encounter for screening for other suspected endocrine disorder: Secondary | ICD-10-CM | POA: Diagnosis not present

## 2022-06-26 DIAGNOSIS — Z1389 Encounter for screening for other disorder: Secondary | ICD-10-CM | POA: Diagnosis not present

## 2022-06-26 DIAGNOSIS — F419 Anxiety disorder, unspecified: Secondary | ICD-10-CM

## 2022-06-26 DIAGNOSIS — K760 Fatty (change of) liver, not elsewhere classified: Secondary | ICD-10-CM

## 2022-06-26 DIAGNOSIS — H3562 Retinal hemorrhage, left eye: Secondary | ICD-10-CM

## 2022-06-26 DIAGNOSIS — E782 Mixed hyperlipidemia: Secondary | ICD-10-CM

## 2022-06-26 DIAGNOSIS — E663 Overweight: Secondary | ICD-10-CM

## 2022-06-26 DIAGNOSIS — Z0001 Encounter for general adult medical examination with abnormal findings: Secondary | ICD-10-CM

## 2022-06-26 NOTE — Progress Notes (Signed)
COMPLETE PHYSICAL Assessment and Plan:  Margaret Sullivan was seen today for annual exam.  Diagnoses and all orders for this visit:  Encounter for general adult medical examination with abnormal findings Due yearly, declines mammogram Colonoscopy due 2024- will call to schedule   Labile hypertension She does not wish to start blood pressure medication, counseled on dangers of untreated hypertension - Strongly encouraged to start Losaratn 25 mg , keep bp log and follow up in 4 weeks - continue DASH diet, exercise and monitor at home. Call if greater than 130/80.  Start Dr. Lonni Fix diet for hypertension and hyperlipidemia and will reevaluate - Go to the ER if any chest pain, shortness of breath, nausea, dizziness, severe HA, changes vision/speech -     CBC with Differential/Platelet  Mixed hyperlipidemia Continue diet and exercise  -     COMPLETE METABOLIC PANEL WITH GFR -     Lipid panel -     TSH  Anxiety Continue behavior modifications Was prescribed Xanax in 2021 but never had to use  Abnormal glucose Continue diet and exercise -     Hemoglobin A1c  Anemia, unspecified type Monitor -     CBC with Differential/Platelet - Total iron and Ferritin - Vit B12   Vitamin D deficiency Continue Vit D supplementation to maintain value in therapeutic level of 60-100  -     VITAMIN D 25 Hydroxy (Vit-D Deficiency, Fractures)  Medication management -     Magnesium  Gastroesophageal reflux disease with esophagitis without hemorrhage Continue dietary modifications and monitor -     Magnesium  Fatty liver Recheck LFT's Limit Tylenol use Continue diet and exercise  Screening for hematuria or proteinuria -     Urinalysis, Routine w reflex microscopic -     Microalbumin / creatinine urine ratio  Screening, ischemic heart disease -     EKG 12-Lead  Screening for thyroid disorder -     TSH  Screening for AAA - U/S ABD Retroperitoneal LTD  Subretinal hemorrhage, left Continue to  follow with retina specialist for injections every 10 weeks with Dr. Ashley Royalty  Poor Compliance with Medical Recommendations Stress importance of control of BP, cholesterol and weight- prescribe medications but she does not take the medicines  She is counseled on risks of uncontrolled blood pressure and cholesterol     Discussed med's effects and SE's. Screening labs and tests as requested with regular follow-up as recommended. Over 30 minutes of exam, counseling, chart review, and complex, high level critical decision making was performed this visit.  Future Appointments  Date Time Provider Department Center  07/11/2022  1:05 PM Sherrie George, MD [facility] None  06/26/2023  2:00 PM Raynelle Dick, NP [facility] None    HPI  77 y.o. female  presents for CPE and follow up.  has GERD; Hyperlipidemia; Abnormal glucose; Hypertension; Vitamin D deficiency; Medication management; Poor compliance with medical recommendations; and Fatty liver on their problem list.     BMI is Body mass index is 26.71 kg/m. She has been focusing on diet . Has started today the How not to die diet for blood pressure and cholesterol- she is not eating out. She has not been exercising. Her dog is now to old to be walked.  Wt Readings from Last 3 Encounters:  06/26/22 153 lb 3.2 oz (69.5 kg)  11/08/21 154 lb (69.9 kg)  09/20/21 156 lb 3.2 oz (70.9 kg)   She never started taking her Metoprolol or losartan which have been prescribed. Have  counseled in depth on necessity of controlling BP with medication and risks associated with no treatment, continues to decline meds.   BP not checking at home. She has noted her BP's have been going up since having to get the eye injections- attributes to anxiety. Lost 4 family members since 03/2021. Today their BP is BP: (!) 146/80  . She still has Losartan prescription at home but never started.  BP Readings from Last 3 Encounters:  06/26/22 (!) 146/80  11/08/21 (!) 142/76   09/20/21 (!) 152/64  She does not workout. She denies chest pain, shortness of breath, dizziness.   Thyroid functions are normal, no supplementation Lab Results  Component Value Date   TSH 3.55 11/08/2021    She is drinking 64 ounces of water a day.  Lab Results  Component Value Date   EGFR 72 11/08/2021     She is not on cholesterol medication, she declines statins. Have counseled in depth on necessity of controlling cholesterol with medication and risks associated with no treatment, continues to decline meds.   Her cholesterol is not at goal. The cholesterol last visit was:   Lab Results  Component Value Date   CHOL 222 (H) 11/08/2021   HDL 33 (L) 11/08/2021   LDLCALC 155 (H) 11/08/2021   TRIG 205 (H) 11/08/2021   CHOLHDL 6.7 (H) 11/08/2021   She has been working on diet and exercise for prediabetes, and denies paresthesia of the feet, polydipsia, polyuria and visual disturbances. Last A1C in the office was:  Lab Results  Component Value Date   HGBA1C 5.5 11/08/2021   Patient is on Vitamin D supplement, started back 2000 IU a day.   Lab Results  Component Value Date   VD25OH 25 (L) 09/20/2021   Retired Runner, broadcasting/film/video.   She has had elevated LFTs.  Lab Results  Component Value Date   ALT 43 (H) 11/08/2021   AST 51 (H) 11/08/2021   ALKPHOS 75 04/08/2019   BILITOT 0.6 11/08/2021   She has been evaluated by dermatologist for her hair loss and nail changes- believed it was eczema and uses betamethasone as needed topically.  Current Medications:  Current Outpatient Medications on File Prior to Visit  Medication Sig Dispense Refill   ALPRAZolam (XANAX) 0.25 MG tablet Take 1/2 to 1 tablet 3 x daily as need for anxiety 60 tablet 0   Ascorbic Acid (VITAMIN C) 500 MG CAPS Take by mouth.     betamethasone dipropionate (DIPROLENE) 0.05 % ointment Apply topically 2 (two) times daily.     Bevacizumab (AVASTIN IV) Inject into the vein. Every 10 weeks     Biotin 5000 MCG CAPS Take by  mouth.     Cholecalciferol (D3 PO) Take 2,000 Units by mouth daily.     losartan (COZAAR) 25 MG tablet Take 1 tablet (25 mg total) by mouth daily. (Patient not taking: Reported on 11/08/2021) 30 tablet 3   No current facility-administered medications on file prior to visit.   Health Maintenance:   Immunization History  Administered Date(s) Administered   Pneumococcal-Unspecified 03/05/1997   Td 03/05/1998   Health Maintenance  Topic Date Due   COVID-19 Vaccine (1) Never done   Zoster Vaccines- Shingrix (1 of 2) Never done   DTaP/Tdap/Td (2 - Tdap) 03/05/2008   DEXA SCAN  Never done   COLONOSCOPY (Pts 45-70yrs Insurance coverage will need to be confirmed)  05/21/2022   Pneumonia Vaccine 65+ Years old (1 of 1 - PCV) 11/09/2022 (Originally 12/24/2010)  INFLUENZA VACCINE  10/04/2022   Medicare Annual Wellness (AWV)  11/09/2022   Hepatitis C Screening  Completed   HPV VACCINES  Aged Out     Last Dental Exam: Dr. Alvester Morin, 06/25/22 Last Eye Exam: Dr. Emily Filbert, wears glasses, 05/2022 Patient Care Team: Lucky Cowboy, MD as PCP - General (Internal Medicine) Elise Benne, MD as Consulting Physician (Ophthalmology) Cheral Bay (Dentistry) Javier Glazier as Referring Physician (Physician Assistant) Doree Albee, PA-C as Referring Physician (Physician Assistant)  Medical History:  Past Medical History:  Diagnosis Date   Allergy    Essential tremor    GERD (gastroesophageal reflux disease)    Heart murmur    High cholesterol    Hyperlipidemia    Hypertension    labile   Labile hypertension    Prediabetes    Allergies Allergies  Allergen Reactions   Ampicillin Hives and Swelling   Aspirin Itching   Doxycycline Nausea And Vomiting   Macrodantin [Nitrofurantoin Macrocrystal] Hives, Itching and Swelling   Percocet [Oxycodone-Acetaminophen]    Propulsid [Cisapride] Itching and Swelling   Voltaren [Diclofenac] Hives and Itching   Codeine Palpitations   Penicillins  Swelling and Rash    SURGICAL HISTORY She  has a past surgical history that includes Back surgery (2001); Upper gastrointestinal endoscopy; Colonoscopy; Tonsillectomy and adenoidectomy (Bilateral, 1950); and Wisdom tooth extraction (Bilateral, 1969). FAMILY HISTORY Her family history includes Breast cancer in her father; Colon cancer in her maternal uncle; Heart attack in her father; Lymphoma in her mother. SOCIAL HISTORY She  reports that she has never smoked. She has never used smokeless tobacco. She reports that she does not drink alcohol and does not use drugs.   Review of Systems: Review of Systems  Constitutional: Negative.  Negative for chills and fever.  HENT: Negative.  Negative for congestion, hearing loss, sinus pain, sore throat and tinnitus.   Eyes: Negative.  Negative for blurred vision and double vision.  Respiratory: Negative.  Negative for cough, hemoptysis, sputum production, shortness of breath and wheezing.   Cardiovascular: Negative.  Negative for chest pain, palpitations and leg swelling.  Gastrointestinal:  Negative for abdominal pain, blood in stool, constipation, diarrhea, heartburn, melena, nausea and vomiting.  Genitourinary: Negative.  Negative for dysuria, flank pain, frequency, hematuria and urgency.  Musculoskeletal: Negative.  Negative for back pain, falls, joint pain, myalgias and neck pain.  Skin: Negative.  Negative for rash.  Neurological: Negative.  Negative for dizziness, tingling, tremors, weakness and headaches.  Endo/Heme/Allergies:  Does not bruise/bleed easily.  Psychiatric/Behavioral: Negative.  Negative for depression and suicidal ideas. The patient is not nervous/anxious and does not have insomnia.     Physical Exam: Estimated body mass index is 26.71 kg/m as calculated from the following:   Height as of this encounter: 5' 3.5" (1.613 m).   Weight as of this encounter: 153 lb 3.2 oz (69.5 kg). BP (!) 146/80   Pulse 79   Temp 97.7 F  (36.5 C)   Ht 5' 3.5" (1.613 m)   Wt 153 lb 3.2 oz (69.5 kg)   SpO2 96%   BMI 26.71 kg/m  General Appearance: Well nourished, in no apparent distress.  Eyes: PERRLA, EOMs, conjunctiva no swelling or erythema, normal fundi and vessels.  Sinuses: No Frontal/maxillary tenderness  ENT/Mouth: Ext aud canals clear, normal light reflex with TMs without erythema, bulging. Good dentition. No erythema, swelling, or exudate on post pharynx. Tonsils not swollen or erythematous. Hearing normal.  Neck: Supple, thyroid normal. No bruits  Respiratory: Respiratory effort normal, BS equal bilaterally without rales, rhonchi, wheezing or stridor.  Cardio: RRR without murmurs, rubs or gallops. Brisk peripheral pulses without edema.  Chest: symmetric, with normal excursions and percussion.  Breasts: Deferred Abdomen: Soft, slight upper AB distention however nontender, no guarding, rebound, hernias, masses, or organomegaly.  Lymphatics: Non tender without lymphadenopathy.  Genitourinary: defer Musculoskeletal: Full ROM all peripheral extremities,5/5 strength, and normal gait.  Skin: Warm, dry without rashes, lesions, ecchymosis. Neuro: Cranial nerves intact, reflexes equal bilaterally. Normal muscle tone, no cerebellar symptoms. Sensation intact.  Psych: Awake and oriented X 3, normal affect, Insight and Judgment appropriate.   EKG IRBBB, no ST changes AAA: < 3 cm  Conchetta Lamia E  2:41 PM [facility]

## 2022-06-26 NOTE — Patient Instructions (Signed)
Hypertension, Adult High blood pressure (hypertension) is when the force of blood pumping through the arteries is too strong. The arteries are the blood vessels that carry blood from the heart throughout the body. Hypertension forces the heart to work harder to pump blood and may cause arteries to become narrow or stiff. Untreated or uncontrolled hypertension can lead to a heart attack, heart failure, a stroke, kidney disease, and other problems. A blood pressure reading consists of a higher number over a lower number. Ideally, your blood pressure should be below 120/80. The first ("top") number is called the systolic pressure. It is a measure of the pressure in your arteries as your heart beats. The second ("bottom") number is called the diastolic pressure. It is a measure of the pressure in your arteries as the heart relaxes. What are the causes? The exact cause of this condition is not known. There are some conditions that result in high blood pressure. What increases the risk? Certain factors may make you more likely to develop high blood pressure. Some of these risk factors are under your control, including: Smoking. Not getting enough exercise or physical activity. Being overweight. Having too much fat, sugar, calories, or salt (sodium) in your diet. Drinking too much alcohol. Other risk factors include: Having a personal history of heart disease, diabetes, high cholesterol, or kidney disease. Stress. Having a family history of high blood pressure and high cholesterol. Having obstructive sleep apnea. Age. The risk increases with age. What are the signs or symptoms? High blood pressure may not cause symptoms. Very high blood pressure (hypertensive crisis) may cause: Headache. Fast or irregular heartbeats (palpitations). Shortness of breath. Nosebleed. Nausea and vomiting. Vision changes. Severe chest pain, dizziness, and seizures. How is this diagnosed? This condition is diagnosed by  measuring your blood pressure while you are seated, with your arm resting on a flat surface, your legs uncrossed, and your feet flat on the floor. The cuff of the blood pressure monitor will be placed directly against the skin of your upper arm at the level of your heart. Blood pressure should be measured at least twice using the same arm. Certain conditions can cause a difference in blood pressure between your right and left arms. If you have a high blood pressure reading during one visit or you have normal blood pressure with other risk factors, you may be asked to: Return on a different day to have your blood pressure checked again. Monitor your blood pressure at home for 1 week or longer. If you are diagnosed with hypertension, you may have other blood or imaging tests to help your health care provider understand your overall risk for other conditions. How is this treated? This condition is treated by making healthy lifestyle changes, such as eating healthy foods, exercising more, and reducing your alcohol intake. You may be referred for counseling on a healthy diet and physical activity. Your health care provider may prescribe medicine if lifestyle changes are not enough to get your blood pressure under control and if: Your systolic blood pressure is above 130. Your diastolic blood pressure is above 80. Your personal target blood pressure may vary depending on your medical conditions, your age, and other factors. Follow these instructions at home: Eating and drinking  Eat a diet that is high in fiber and potassium, and low in sodium, added sugar, and fat. An example of this eating plan is called the DASH diet. DASH stands for Dietary Approaches to Stop Hypertension. To eat this way: Eat   plenty of fresh fruits and vegetables. Try to fill one half of your plate at each meal with fruits and vegetables. Eat whole grains, such as whole-wheat pasta, brown rice, or whole-grain bread. Fill about one  fourth of your plate with whole grains. Eat or drink low-fat dairy products, such as skim milk or low-fat yogurt. Avoid fatty cuts of meat, processed or cured meats, and poultry with skin. Fill about one fourth of your plate with lean proteins, such as fish, chicken without skin, beans, eggs, or tofu. Avoid pre-made and processed foods. These tend to be higher in sodium, added sugar, and fat. Reduce your daily sodium intake. Many people with hypertension should eat less than 1,500 mg of sodium a day. Do not drink alcohol if: Your health care provider tells you not to drink. You are pregnant, may be pregnant, or are planning to become pregnant. If you drink alcohol: Limit how much you have to: 0-1 drink a day for women. 0-2 drinks a day for men. Know how much alcohol is in your drink. In the [place] one drink equals one 12 oz bottle of beer (355 mL), one 5 oz glass of wine (148 mL), or one 1 oz glass of hard liquor (44 mL). Lifestyle  Work with your health care provider to maintain a healthy body weight or to lose weight. Ask what an ideal weight is for you. Get at least 30 minutes of exercise that causes your heart to beat faster (aerobic exercise) most days of the week. Activities may include walking, swimming, or biking. Include exercise to strengthen your muscles (resistance exercise), such as Pilates or lifting weights, as part of your weekly exercise routine. Try to do these types of exercises for 30 minutes at least 3 days a week. Do not use any products that contain nicotine or tobacco. These products include cigarettes, chewing tobacco, and vaping devices, such as e-cigarettes. If you need help quitting, ask your health care provider. Monitor your blood pressure at home as told by your health care provider. Keep all follow-up visits. This is important. Medicines Take over-the-counter and prescription medicines only as told by your health care provider. Follow directions carefully. Blood  pressure medicines must be taken as prescribed. Do not skip doses of blood pressure medicine. Doing this puts you at risk for problems and can make the medicine less effective. Ask your health care provider about side effects or reactions to medicines that you should watch for. Contact a health care provider if you: Think you are having a reaction to a medicine you are taking. Have headaches that keep coming back (recurring). Feel dizzy. Have swelling in your ankles. Have trouble with your vision. Get help right away if you: Develop a severe headache or confusion. Have unusual weakness or numbness. Feel faint. Have severe pain in your chest or abdomen. Vomit repeatedly. Have trouble breathing. These symptoms may be an emergency. Get help right away. Call 911. Do not wait to see if the symptoms will go away. Do not drive yourself to the hospital. Summary Hypertension is when the force of blood pumping through your arteries is too strong. If this condition is not controlled, it may put you at risk for serious complications. Your personal target blood pressure may vary depending on your medical conditions, your age, and other factors. For most people, a normal blood pressure is less than 120/80. Hypertension is treated with lifestyle changes, medicines, or a combination of both. Lifestyle changes include losing weight, eating a healthy,   low-sodium diet, exercising more, and limiting alcohol. This information is not intended to replace advice given to you by your health care provider. Make sure you discuss any questions you have with your health care provider. Document Revised: 12/27/2020 Document Reviewed: 12/27/2020 Elsevier Patient Education  2023 [employer] Inc[employer]  

## 2022-06-27 LAB — CBC WITH DIFFERENTIAL/PLATELET
Absolute Monocytes: 930 cells/uL (ref 200–950)
Basophils Absolute: 67 cells/uL (ref 0–200)
Basophils Relative: 0.6 %
Eosinophils Absolute: 202 cells/uL (ref 15–500)
Eosinophils Relative: 1.8 %
HCT: 40.2 % (ref 35.0–45.0)
Hemoglobin: 13.8 g/dL (ref 11.7–15.5)
Lymphs Abs: 2464 cells/uL (ref 850–3900)
MCH: 30.8 pg (ref 27.0–33.0)
MCHC: 34.3 g/dL (ref 32.0–36.0)
MCV: 89.7 fL (ref 80.0–100.0)
MPV: 10.3 fL (ref 7.5–12.5)
Monocytes Relative: 8.3 %
Neutro Abs: 7538 cells/uL (ref 1500–7800)
Neutrophils Relative %: 67.3 %
Platelets: 234 10*3/uL (ref 140–400)
RBC: 4.48 10*6/uL (ref 3.80–5.10)
RDW: 12.6 % (ref 11.0–15.0)
Total Lymphocyte: 22 %
WBC: 11.2 10*3/uL — ABNORMAL HIGH (ref 3.8–10.8)

## 2022-06-27 LAB — MICROALBUMIN / CREATININE URINE RATIO
Creatinine, Urine: 30 mg/dL (ref 20–275)
Microalb Creat Ratio: 40 mg/g creat — ABNORMAL HIGH (ref <30)
Microalb, Ur: 1.2 mg/dL

## 2022-06-27 LAB — COMPLETE METABOLIC PANEL WITH GFR
AG Ratio: 1.6 (calc) (ref 1.0–2.5)
ALT: 28 U/L (ref 6–29)
AST: 35 U/L (ref 10–35)
Albumin: 4.9 g/dL (ref 3.6–5.1)
Alkaline phosphatase (APISO): 74 U/L (ref 37–153)
BUN: 18 mg/dL (ref 7–25)
CO2: 28 mmol/L (ref 20–32)
Calcium: 9.8 mg/dL (ref 8.6–10.4)
Chloride: 102 mmol/L (ref 98–110)
Creat: 0.68 mg/dL (ref 0.60–1.00)
Globulin: 3 g/dL (calc) (ref 1.9–3.7)
Glucose, Bld: 88 mg/dL (ref 65–99)
Potassium: 4 mmol/L (ref 3.5–5.3)
Sodium: 139 mmol/L (ref 135–146)
Total Bilirubin: 0.7 mg/dL (ref 0.2–1.2)
Total Protein: 7.9 g/dL (ref 6.1–8.1)
eGFR: 90 mL/min/{1.73_m2} (ref >=60)

## 2022-06-27 LAB — URINALYSIS, ROUTINE W REFLEX MICROSCOPIC
Bilirubin Urine: NEGATIVE
Glucose, UA: NEGATIVE
Hgb urine dipstick: NEGATIVE
Ketones, ur: NEGATIVE
Leukocytes,Ua: NEGATIVE
Nitrite: NEGATIVE
Protein, ur: NEGATIVE
Specific Gravity, Urine: 1.004 (ref 1.001–1.035)
pH: 5.5 (ref 5.0–8.0)

## 2022-06-27 LAB — HEMOGLOBIN A1C
Hgb A1c MFr Bld: 5.8 % of total Hgb — ABNORMAL HIGH (ref <5.7)
Mean Plasma Glucose: 120 mg/dL
eAG (mmol/L): 6.6 mmol/L

## 2022-06-27 LAB — LIPID PANEL
Cholesterol: 231 mg/dL — ABNORMAL HIGH (ref <200)
HDL: 39 mg/dL — ABNORMAL LOW (ref >=50)
LDL Cholesterol (Calc): 153 mg/dL (calc) — ABNORMAL HIGH
Non-HDL Cholesterol (Calc): 192 mg/dL (calc) — ABNORMAL HIGH (ref <130)
Total CHOL/HDL Ratio: 5.9 (calc) — ABNORMAL HIGH (ref <5.0)
Triglycerides: 232 mg/dL — ABNORMAL HIGH (ref <150)

## 2022-06-27 LAB — VITAMIN D 25 HYDROXY (VIT D DEFICIENCY, FRACTURES): Vit D, 25-Hydroxy: 39 ng/mL (ref 30–100)

## 2022-06-27 LAB — TSH: TSH: 3.08 mIU/L (ref 0.40–4.50)

## 2022-06-27 LAB — MAGNESIUM: Magnesium: 2.1 mg/dL (ref 1.5–2.5)

## 2022-06-27 LAB — FERRITIN: Ferritin: 169 ng/mL (ref 16–288)

## 2022-06-27 LAB — VITAMIN B12: Vitamin B-12: 317 pg/mL (ref 200–1100)

## 2022-06-27 LAB — IRON, TOTAL/TOTAL IRON BINDING CAP
%SAT: 39 % (calc) (ref 16–45)
Iron: 152 ug/dL (ref 45–160)
TIBC: 393 mcg/dL (calc) (ref 250–450)

## 2022-07-04 ENCOUNTER — Encounter: Payer: Self-pay | Admitting: Gastroenterology

## 2022-07-11 ENCOUNTER — Encounter (INDEPENDENT_AMBULATORY_CARE_PROVIDER_SITE_OTHER): Payer: Medicare PPO | Admitting: Ophthalmology

## 2022-07-11 DIAGNOSIS — H318 Other specified disorders of choroid: Secondary | ICD-10-CM | POA: Diagnosis not present

## 2022-07-11 DIAGNOSIS — I10 Essential (primary) hypertension: Secondary | ICD-10-CM | POA: Diagnosis not present

## 2022-07-11 DIAGNOSIS — H43813 Vitreous degeneration, bilateral: Secondary | ICD-10-CM

## 2022-07-11 DIAGNOSIS — H35033 Hypertensive retinopathy, bilateral: Secondary | ICD-10-CM | POA: Diagnosis not present

## 2022-07-23 NOTE — Progress Notes (Unsigned)
Assessment and Plan: Margaret Sullivan was seen today for follow-up.  Diagnoses and all orders for this visit:  Essential hypertension - Start Losartan 25 mg QD and keep BP log and follow up in 3 months - Extensive time taken > 20 minutes reviewing the risks of untreated hypertension - continue DASH diet, exercise and monitor at home. Call if greater than 130/80.   Mixed hyperlipidemia Continues to decline medications Low discussion on risks of untreated hyperliidemia Follow upin 3 months  Noncompliance with medication regimen - Discussed that she is noncompliant with treatment regimen as BP and lipid levels remain uncontrolled and has declined all medication to this point      Further disposition pending results of labs. Discussed med's effects and SE's.   Over 30 minutes of exam, counseling, chart review, and critical decision making was performed.   Future Appointments  Date Time Provider Department Center  09/19/2022 12:35 PM Sherrie George, MD [facility] None  06/26/2023  2:00 PM Margaret Dick, NP [facility] None    ------------------------------------------------------------------------------------------------------------------   HPI BP (!) 172/90   Pulse 73   Temp 97.9 F (36.6 C)   Ht 5' 3.5" (1.613 m)   Wt 152 lb 6.4 oz (69.1 kg)   SpO2 95%   BMI 26.57 kg/m   77 y.o.female presents for reevaluation of BP.  She has not been taking her Losartan  At last visit she was strongly encouraged to start Losartan 25 mg QD and keep BP log daily.  She did not start the medication. Home logs show BP 144-192/70's BP Readings from Last 3 Encounters:  07/24/22 (!) 172/90  06/26/22 (!) 146/80  11/08/21 (!) 142/76   BMI is Body mass index is 26.57 kg/m., she has not been working on diet and exercise. Wt Readings from Last 3 Encounters:  07/24/22 152 lb 6.4 oz (69.1 kg)  06/26/22 153 lb 3.2 oz (69.5 kg)  11/08/21 154 lb (69.9 kg)    Pt was encouraged to start Rosuvastatin 5 mg  daily but patient declined.  Lab Results  Component Value Date   CHOL 231 (H) 06/26/2022   HDL 39 (L) 06/26/2022   LDLCALC 153 (H) 06/26/2022   TRIG 232 (H) 06/26/2022   CHOLHDL 5.9 (H) 06/26/2022     Past Medical History:  Diagnosis Date   Allergy    Essential tremor    GERD (gastroesophageal reflux disease)    Heart murmur    High cholesterol    Hyperlipidemia    Hypertension    labile   Labile hypertension    Prediabetes      Allergies  Allergen Reactions   Ampicillin Hives and Swelling   Aspirin Itching   Doxycycline Nausea And Vomiting   Macrodantin [Nitrofurantoin Macrocrystal] Hives, Itching and Swelling   Percocet [Oxycodone-Acetaminophen]    Propulsid [Cisapride] Itching and Swelling   Voltaren [Diclofenac] Hives and Itching   Codeine Palpitations   Penicillins Swelling and Rash    Current Outpatient Medications on File Prior to Visit  Medication Sig   ALPRAZolam (XANAX) 0.25 MG tablet Take 1/2 to 1 tablet 3 x daily as need for anxiety   Ascorbic Acid (VITAMIN C) 500 MG CAPS Take by mouth.   betamethasone dipropionate (DIPROLENE) 0.05 % ointment Apply topically 2 (two) times daily.   Bevacizumab (AVASTIN IV) Inject into the vein. Every 10 weeks   Biotin 5000 MCG CAPS Take by mouth.   chlorhexidine (PERIDEX) 0.12 % solution SMARTSIG:0.5 Ounce(s) By Mouth Morning-Night   Cholecalciferol (D3  PO) Take 2,000 Units by mouth daily.   losartan (COZAAR) 25 MG tablet Take 1 tablet (25 mg total) by mouth daily. (Patient not taking: Reported on 11/08/2021)   No current facility-administered medications on file prior to visit.    ROS: all negative except above.   Physical Exam:  BP (!) 172/90   Pulse 73   Temp 97.9 F (36.6 C)   Ht 5' 3.5" (1.613 m)   Wt 152 lb 6.4 oz (69.1 kg)   SpO2 95%   BMI 26.57 kg/m   General Appearance: Pleasant, slightly anxious female, in no apparent distress. Eyes: PERRLA, EOMs, conjunctiva no swelling or erythema Neck: Supple,  thyroid normal.  Respiratory: Respiratory effort normal, BS equal bilaterally without rales, rhonchi, wheezing or stridor.  Cardio: RRR with no MRGs. Brisk peripheral pulses without edema.  Abdomen: Soft, + BS.  Non tender, no guarding, rebound, hernias, masses. Lymphatics: Non tender without lymphadenopathy.  Musculoskeletal: Full ROM, 5/5 strength, normal gait.  Skin: Warm, dry without rashes, lesions, ecchymosis.  Neuro: Cranial nerves intact. Normal muscle tone, no cerebellar symptoms. Sensation intact.  Psych: Awake and oriented X 3, normal affect, Insight and Judgment appropriate.     Margaret Dick, NP 11:52 AM [facility]

## 2022-07-24 ENCOUNTER — Encounter: Payer: Self-pay | Admitting: Nurse Practitioner

## 2022-07-24 ENCOUNTER — Ambulatory Visit: Payer: Medicare PPO | Admitting: Nurse Practitioner

## 2022-07-24 VITALS — BP 160/90 | HR 73 | Temp 97.9°F | Ht 63.5 in | Wt 152.4 lb

## 2022-07-24 DIAGNOSIS — Z91148 Patient's other noncompliance with medication regimen for other reason: Secondary | ICD-10-CM | POA: Diagnosis not present

## 2022-07-24 DIAGNOSIS — E782 Mixed hyperlipidemia: Secondary | ICD-10-CM | POA: Diagnosis not present

## 2022-07-24 DIAGNOSIS — I10 Essential (primary) hypertension: Secondary | ICD-10-CM

## 2022-07-24 NOTE — Patient Instructions (Addendum)
Preventing High Cholesterol Cholesterol is a white, waxy substance similar to fat that the human body needs to help build cells. The liver makes all the cholesterol that a person's body needs. Having high cholesterol (hypercholesterolemia) increases your risk for heart disease and stroke. Extra or excess cholesterol comes from the food that you eat. High cholesterol can often be prevented with diet and lifestyle changes. If you already have high cholesterol, you can control it with diet, lifestyle changes, and medicines. How can high cholesterol affect me? If you have high cholesterol, fatty deposits (plaques) may build up on the walls of your blood vessels. The blood vessels that carry blood away from your heart are called arteries. Plaques make the arteries narrower and stiffer. This in turn can: Restrict or block blood flow and cause blood clots to form. Increase your risk for heart attack and stroke. What can increase my risk for high cholesterol? This condition is more likely to develop in people who: Eat foods that are high in saturated fat or cholesterol. Saturated fat is mostly found in foods that come from animal sources. Are overweight. Are not getting enough exercise. Use products that contain nicotine or tobacco, such as cigarettes, e-cigarettes, and chewing tobacco. Have a family history of high cholesterol (familial hypercholesterolemia). What actions can I take to prevent this? Nutrition  Eat less saturated fat. Avoid trans fats (partially hydrogenated oils). These are often found in margarine and in some baked goods, fried foods, and snacks bought in packages. Avoid precooked or cured meat, such as bacon, sausages, or meat loaves. Avoid foods and drinks that have added sugars. Eat more fruits, vegetables, and whole grains. Choose healthy sources of protein, such as fish, poultry, lean cuts of red meat, beans, peas, lentils, and nuts. Choose healthy sources of fat, such  as: Nuts. Vegetable oils, especially olive oil. Fish that have healthy fats, such as omega-3 fatty acids. These fish include mackerel or salmon. Lifestyle Lose weight if you are overweight. Maintaining a healthy body mass index (BMI) can help prevent or control high cholesterol. It can also lower your risk for diabetes and high blood pressure. Ask your health care provider to help you with a diet and exercise plan to lose weight safely. Do not use any products that contain nicotine or tobacco. These products include cigarettes, chewing tobacco, and vaping devices, such as e-cigarettes. If you need help quitting, ask your health care provider. Alcohol use Do not drink alcohol if: Your health care provider tells you not to drink. You are pregnant, may be pregnant, or are planning to become pregnant. If you drink alcohol: Limit how much you have to: 0-1 drink a day for women. 0-2 drinks a day for men. Know how much alcohol is in your drink. In the [place] one drink equals one 12 oz bottle of beer (355 mL), one 5 oz glass of wine (148 mL), or one 1 oz glass of hard liquor (44 mL). Activity  Get enough exercise. Do exercises as told by your health care provider. Each week, do at least 150 minutes of exercise that takes a medium level of effort (moderate-intensity exercise). This kind of exercise: Makes your heart beat faster while allowing you to still be able to talk. Can be done in short sessions several times a day or longer sessions a few times a week. For example, on 5 days each week, you could walk fast or ride your bike 3 times a day for 10 minutes each time. Medicines Your  health care provider may recommend medicines to help lower cholesterol. This may be a medicine to lower the amount of cholesterol that your liver makes. You may need medicine if: Diet and lifestyle changes have not lowered your cholesterol enough. You have high cholesterol and other risk factors for heart disease or  stroke. Take over-the-counter and prescription medicines only as told by your health care provider. General information Manage your risk factors for high cholesterol. Talk with your health care provider about all your risk factors and how to lower your risk. Manage other conditions that you have, such as diabetes or high blood pressure (hypertension). Have blood tests to check your cholesterol levels at regular points in time as told by your health care provider. Keep all follow-up visits. This is important. Where to find more information American Heart Association: www.heart.org National Heart, Lung, and Blood Institute: PopSteam.is Summary High cholesterol increases your risk for heart disease and stroke. By keeping your cholesterol level low, you can reduce your risk for these conditions. High cholesterol can often be prevented with diet and lifestyle changes. Work with your health care provider to manage your risk factors, and have your blood tested regularly. This information is not intended to replace advice given to you by your health care provider. Make sure you discuss any questions you have with your health care provider. Document Revised: 09/22/2021 Document Reviewed: 04/25/2020 Elsevier Patient Education  2023 [employer]   Managing Your Hypertension Hypertension, also called high blood pressure, is when the force of the blood pressing against the walls of the arteries is too strong. Arteries are blood vessels that carry blood from your heart throughout your body. Hypertension forces the heart to work harder to pump blood and may cause the arteries to become narrow or stiff. Understanding blood pressure readings A blood pressure reading includes a higher number over a lower number: The first, or top, number is called the systolic pressure. It is a measure of the pressure in your arteries as your heart beats. The second, or bottom number, is called the diastolic pressure.  It is a measure of the pressure in your arteries as the heart relaxes. For most people, a normal blood pressure is below 120/80. Your personal target blood pressure may vary depending on your medical conditions, your age, and other factors. Blood pressure is classified into four stages. Based on your blood pressure reading, your health care provider may use the following stages to determine what type of treatment you need, if any. Systolic pressure and diastolic pressure are measured in a unit called millimeters of mercury (mmHg). Normal Systolic pressure: below 120. Diastolic pressure: below 80. Elevated Systolic pressure: 120-129. Diastolic pressure: below 80. Hypertension stage 1 Systolic pressure: 130-139. Diastolic pressure: 80-89. Hypertension stage 2 Systolic pressure: 140 or above. Diastolic pressure: 90 or above. How can this condition affect me? Managing your hypertension is very important. Over time, hypertension can damage the arteries and decrease blood flow to parts of the body, including the brain, heart, and kidneys. Having untreated or uncontrolled hypertension can lead to: A heart attack. A stroke. A weakened blood vessel (aneurysm). Heart failure. Kidney damage. Eye damage. Memory and concentration problems. Vascular dementia. What actions can I take to manage this condition? Hypertension can be managed by making lifestyle changes and possibly by taking medicines. Your health care provider will help you make a plan to bring your blood pressure within a normal range. You may be referred for counseling on a healthy diet and  physical activity. Nutrition  Eat a diet that is high in fiber and potassium, and low in salt (sodium), added sugar, and fat. An example eating plan is called the DASH diet. DASH stands for Dietary Approaches to Stop Hypertension. To eat this way: Eat plenty of fresh fruits and vegetables. Try to fill one-half of your plate at each meal with fruits  and vegetables. Eat whole grains, such as whole-wheat pasta, brown rice, or whole-grain bread. Fill about one-fourth of your plate with whole grains. Eat low-fat dairy products. Avoid fatty cuts of meat, processed or cured meats, and poultry with skin. Fill about one-fourth of your plate with lean proteins such as fish, chicken without skin, beans, eggs, and tofu. Avoid pre-made and processed foods. These tend to be higher in sodium, added sugar, and fat. Reduce your daily sodium intake. Many people with hypertension should eat less than 1,500 mg of sodium a day. Lifestyle  Work with your health care provider to maintain a healthy body weight or to lose weight. Ask what an ideal weight is for you. Get at least 30 minutes of exercise that causes your heart to beat faster (aerobic exercise) most days of the week. Activities may include walking, swimming, or biking. Include exercise to strengthen your muscles (resistance exercise), such as weight lifting, as part of your weekly exercise routine. Try to do these types of exercises for 30 minutes at least 3 days a week. Do not use any products that contain nicotine or tobacco. These products include cigarettes, chewing tobacco, and vaping devices, such as e-cigarettes. If you need help quitting, ask your health care provider. Control any long-term (chronic) conditions you have, such as high cholesterol or diabetes. Identify your sources of stress and find ways to manage stress. This may include meditation, deep breathing, or making time for fun activities. Alcohol use Do not drink alcohol if: Your health care provider tells you not to drink. You are pregnant, may be pregnant, or are planning to become pregnant. If you drink alcohol: Limit how much you have to: 0-1 drink a day for women. 0-2 drinks a day for men. Know how much alcohol is in your drink. In the [place] one drink equals one 12 oz bottle of beer (355 mL), one 5 oz glass of wine (148 mL),  or one 1 oz glass of hard liquor (44 mL). Medicines Your health care provider may prescribe medicine if lifestyle changes are not enough to get your blood pressure under control and if: Your systolic blood pressure is 130 or higher. Your diastolic blood pressure is 80 or higher. Take medicines only as told by your health care provider. Follow the directions carefully. Blood pressure medicines must be taken as told by your health care provider. The medicine does not work as well when you skip doses. Skipping doses also puts you at risk for problems. Monitoring Before you monitor your blood pressure: Do not smoke, drink caffeinated beverages, or exercise within 30 minutes before taking a measurement. Use the bathroom and empty your bladder (urinate). Sit quietly for at least 5 minutes before taking measurements. Monitor your blood pressure at home as told by your health care provider. To do this: Sit with your back straight and supported. Place your feet flat on the floor. Do not cross your legs. Support your arm on a flat surface, such as a table. Make sure your upper arm is at heart level. Each time you measure, take two or three readings one minute apart and  record the results. You may also need to have your blood pressure checked regularly by your health care provider. General information Talk with your health care provider about your diet, exercise habits, and other lifestyle factors that may be contributing to hypertension. Review all the medicines you take with your health care provider because there may be side effects or interactions. Keep all follow-up visits. Your health care provider can help you create and adjust your plan for managing your high blood pressure. Where to find more information National Heart, Lung, and Blood Institute: PopSteam.is American Heart Association: www.heart.org Contact a health care provider if: You think you are having a reaction to medicines you  have taken. You have repeated (recurrent) headaches. You feel dizzy. You have swelling in your ankles. You have trouble with your vision. Get help right away if: You develop a severe headache or confusion. You have unusual weakness or numbness, or you feel faint. You have severe pain in your chest or abdomen. You vomit repeatedly. You have trouble breathing. These symptoms may be an emergency. Get help right away. Call 911. Do not wait to see if the symptoms will go away. Do not drive yourself to the hospital. Summary Hypertension is when the force of blood pumping through your arteries is too strong. If this condition is not controlled, it may put you at risk for serious complications. Your personal target blood pressure may vary depending on your medical conditions, your age, and other factors. For most people, a normal blood pressure is less than 120/80. Hypertension is managed by lifestyle changes, medicines, or both. Lifestyle changes to help manage hypertension include losing weight, eating a healthy, low-sodium diet, exercising more, stopping smoking, and limiting alcohol. This information is not intended to replace advice given to you by your health care provider. Make sure you discuss any questions you have with your health care provider. Document Revised: 11/03/2020 Document Reviewed: 11/03/2020 Elsevier Patient Education  2023 [employer].

## 2022-08-09 ENCOUNTER — Telehealth: Payer: Self-pay | Admitting: Nurse Practitioner

## 2022-08-09 NOTE — Telephone Encounter (Signed)
This should not be causing fainting unless your blood pressure dropped extremely low.  Fainting can also be caused by heat or low blood sugar or dehydration.

## 2022-08-09 NOTE — Telephone Encounter (Signed)
Patient made an appointment to come in tomorrow to see Annabelle Harman.

## 2022-08-09 NOTE — Progress Notes (Signed)
Assessment and Plan:  There are no diagnoses linked to this encounter.  Margaret Sullivan was seen today for follow-up.  Diagnoses and all orders for this visit:  Essential hypertension - Continue DASH diet, exercise and monitor at home. Call if greater than 130/80.  Declines medication against medical advice and knows the risk factors of untreated hypertension Go to the ER if any chest pain, shortness of breath, nausea, dizziness, severe HA, changes vision/speech  Over 40 minutes spent counseling patient on the risks of hypertension and my recommendation that she take medication to control BP - She declines and has verbalized that she knows this is against medical advice and knows this is considered noncompliance.  -CMP  Noncompliance with medication regimen Patient has declined medication against medical advice She plans to do lifestyle changes- diet, exercise water Will follow up in August   Syncope, unspecified syncope type No further episodes, only 1 episode Cranial nerves intact If develops any further      Further disposition pending results of labs. Discussed med's effects and SE's.     Future Appointments  Date Time Provider Department Center  09/19/2022 12:35 PM Sherrie George, MD [facility] None  10/24/2022 11:30 AM Raynelle Dick, NP [facility] None  06/26/2023  2:00 PM Raynelle Dick, NP [facility] None    ------------------------------------------------------------------------------------------------------------------   HPI BP (!) 180/100   Pulse 100   Temp 97.9 F (36.6 C)   Resp 16   Ht 5' 3.5" (1.613 m)   Wt 148 lb 3.2 oz (67.2 kg)   SpO2 96%   BMI 25.84 kg/m   77 y.o.female presents for fainting episode 2 days ago and is concerned could be related to blood pressure medication. Pt was going to the bathroom. She fainted and fell forward and hit her left shoulder, head and right great toe. Occurred in the morning, had only eaten a small amount of breakfast.  Denies headaches, visual changes, chest pain and shortness of breath BP was 122/58 then 101/53, then 105/55 then she passed out. Since stopping 132/51, 145/89, 174/79, 165/71, 164/84, 150/91, 158/75. This am 160/80 States prioor to starting medication 140-150/70-80's.   She has been noticing more frequent urination. Denies hematuria and dysuria. Since stopping Losartan she is no longer having the urinary frequency.  She has been also coughing for several days and is productive of yellow mucus, has sinus congestion and post nasal drip.   She is currently on Losartan 25 mg QD- she took 13 doses. She has been doing more activity. . She stopped her Losartan after fainting so no med since Wednesday- today is the third day. 120-160/70's without meds at home. With meds 120-150/60-70 BP Readings from Last 3 Encounters:  08/10/22 (!) 180/100  07/24/22 (!) 160/90  06/26/22 (!) 146/80   BMI is Body mass index is 25.84 kg/m., she has not been working on diet and exercise. Wt Readings from Last 3 Encounters:  08/10/22 148 lb 3.2 oz (67.2 kg)  07/24/22 152 lb 6.4 oz (69.1 kg)  06/26/22 153 lb 3.2 oz (69.5 kg)     Past Medical History:  Diagnosis Date   Allergy    Essential tremor    GERD (gastroesophageal reflux disease)    Heart murmur    High cholesterol    Hyperlipidemia    Hypertension    labile   Labile hypertension    Prediabetes      Allergies  Allergen Reactions   Ampicillin Hives and Swelling   Aspirin Itching  Doxycycline Nausea And Vomiting   Macrodantin [Nitrofurantoin Macrocrystal] Hives, Itching and Swelling   Percocet [Oxycodone-Acetaminophen]    Propulsid [Cisapride] Itching and Swelling   Voltaren [Diclofenac] Hives and Itching   Codeine Palpitations   Penicillins Swelling and Rash    Current Outpatient Medications on File Prior to Visit  Medication Sig   Ascorbic Acid (VITAMIN C) 500 MG CAPS Take by mouth.   betamethasone dipropionate (DIPROLENE) 0.05 %  ointment Apply topically 2 (two) times daily.   Bevacizumab (AVASTIN IV) Inject into the vein. Every 10 weeks   Biotin 5000 MCG CAPS Take by mouth.   chlorhexidine (PERIDEX) 0.12 % solution SMARTSIG:0.5 Ounce(s) By Mouth Morning-Night   Cholecalciferol (D3 PO) Take 2,000 Units by mouth daily.   ALPRAZolam (XANAX) 0.25 MG tablet Take 1/2 to 1 tablet 3 x daily as need for anxiety (Patient not taking: Reported on 08/10/2022)   losartan (COZAAR) 25 MG tablet Take 1 tablet (25 mg total) by mouth daily. (Patient not taking: Reported on 11/08/2021)   No current facility-administered medications on file prior to visit.    ROS: all negative except above.   Physical Exam:  BP (!) 180/100   Pulse 100   Temp 97.9 F (36.6 C)   Resp 16   Ht 5' 3.5" (1.613 m)   Wt 148 lb 3.2 oz (67.2 kg)   SpO2 96%   BMI 25.84 kg/m   General Appearance: Well nourished, in no apparent distress. Eyes: PERRLA, EOMs, conjunctiva no swelling or erythema Sinuses: No Frontal/maxillary tenderness ENT/Mouth: Ext aud canals clear, TMs without erythema, bulging. No erythema, swelling, or exudate on post pharynx.  Tonsils not swollen or erythematous. Hearing normal.  Neck: Supple, thyroid normal.  Respiratory: Respiratory effort normal, BS equal bilaterally without rales, rhonchi, wheezing or stridor.  Cardio: RRR with no MRGs. Brisk peripheral pulses without edema.  Abdomen: Soft, + BS.  Non tender, no guarding, rebound, hernias, masses. Lymphatics: Non tender without lymphadenopathy.  Musculoskeletal: Full ROM, 5/5 strength, normal gait.  Skin: Warm, dry . Bruise of left forehead and right great toe Neuro: Cranial nerves intact. Normal muscle tone, no cerebellar symptoms. Sensation intact. Tremor of hands bilaterally, slightly worse than previously Psych: Awake and oriented X 3, normal affect, Insight and Judgment appropriate.     Raynelle Dick, NP 9:56 AM [facility]

## 2022-08-09 NOTE — Telephone Encounter (Signed)
PT HAS BEEN ON NEW MED LASARTAN FOR 2WKS AND YESTERDAY SHE FAINTED AND SHE WANTED TO KNOW IS THIS MED RIGHT FOR HER? PLEASE CALL PT BACK AT [phone]. PHARM IS [facility] [address]

## 2022-08-10 ENCOUNTER — Ambulatory Visit: Payer: Medicare PPO | Admitting: Nurse Practitioner

## 2022-08-10 ENCOUNTER — Encounter: Payer: Self-pay | Admitting: Nurse Practitioner

## 2022-08-10 VITALS — BP 180/100 | HR 100 | Temp 97.9°F | Resp 16 | Ht 63.5 in | Wt 148.2 lb

## 2022-08-10 DIAGNOSIS — Z91148 Patient's other noncompliance with medication regimen for other reason: Secondary | ICD-10-CM

## 2022-08-10 DIAGNOSIS — R55 Syncope and collapse: Secondary | ICD-10-CM

## 2022-08-10 DIAGNOSIS — I10 Essential (primary) hypertension: Secondary | ICD-10-CM | POA: Diagnosis not present

## 2022-08-10 LAB — CBC WITH DIFFERENTIAL/PLATELET
Eosinophils Relative: 2.6 %
Lymphs Abs: 1451 cells/uL (ref 850–3900)
MPV: 10 fL (ref 7.5–12.5)
RBC: 4.6 10*6/uL (ref 3.80–5.10)
Total Lymphocyte: 18.6 %

## 2022-08-10 NOTE — Patient Instructions (Signed)
Managing Your Hypertension Hypertension, also called high blood pressure, is when the force of the blood pressing against the walls of the arteries is too strong. Arteries are blood vessels that carry blood from your heart throughout your body. Hypertension forces the heart to work harder to pump blood and may cause the arteries to become narrow or stiff. Understanding blood pressure readings A blood pressure reading includes a higher number over a lower number: The first, or top, number is called the systolic pressure. It is a measure of the pressure in your arteries as your heart beats. The second, or bottom number, is called the diastolic pressure. It is a measure of the pressure in your arteries as the heart relaxes. For most people, a normal blood pressure is below 120/80. Your personal target blood pressure may vary depending on your medical conditions, your age, and other factors. Blood pressure is classified into four stages. Based on your blood pressure reading, your health care provider may use the following stages to determine what type of treatment you need, if any. Systolic pressure and diastolic pressure are measured in a unit called millimeters of mercury (mmHg). Normal Systolic pressure: below 120. Diastolic pressure: below 80. Elevated Systolic pressure: 120-129. Diastolic pressure: below 80. Hypertension stage 1 Systolic pressure: 130-139. Diastolic pressure: 80-89. Hypertension stage 2 Systolic pressure: 140 or above. Diastolic pressure: 90 or above. How can this condition affect me? Managing your hypertension is very important. Over time, hypertension can damage the arteries and decrease blood flow to parts of the body, including the brain, heart, and kidneys. Having untreated or uncontrolled hypertension can lead to: A heart attack. A stroke. A weakened blood vessel (aneurysm). Heart failure. Kidney damage. Eye damage. Memory and concentration problems. Vascular  dementia. What actions can I take to manage this condition? Hypertension can be managed by making lifestyle changes and possibly by taking medicines. Your health care provider will help you make a plan to bring your blood pressure within a normal range. You may be referred for counseling on a healthy diet and physical activity. Nutrition  Eat a diet that is high in fiber and potassium, and low in salt (sodium), added sugar, and fat. An example eating plan is called the DASH diet. DASH stands for Dietary Approaches to Stop Hypertension. To eat this way: Eat plenty of fresh fruits and vegetables. Try to fill one-half of your plate at each meal with fruits and vegetables. Eat whole grains, such as whole-wheat pasta, brown rice, or whole-grain bread. Fill about one-fourth of your plate with whole grains. Eat low-fat dairy products. Avoid fatty cuts of meat, processed or cured meats, and poultry with skin. Fill about one-fourth of your plate with lean proteins such as fish, chicken without skin, beans, eggs, and tofu. Avoid pre-made and processed foods. These tend to be higher in sodium, added sugar, and fat. Reduce your daily sodium intake. Many people with hypertension should eat less than 1,500 mg of sodium a day. Lifestyle  Work with your health care provider to maintain a healthy body weight or to lose weight. Ask what an ideal weight is for you. Get at least 30 minutes of exercise that causes your heart to beat faster (aerobic exercise) most days of the week. Activities may include walking, swimming, or biking. Include exercise to strengthen your muscles (resistance exercise), such as weight lifting, as part of your weekly exercise routine. Try to do these types of exercises for 30 minutes at least 3 days a week. Do   not use any products that contain nicotine or tobacco. These products include cigarettes, chewing tobacco, and vaping devices, such as e-cigarettes. If you need help quitting, ask your  health care provider. Control any long-term (chronic) conditions you have, such as high cholesterol or diabetes. Identify your sources of stress and find ways to manage stress. This may include meditation, deep breathing, or making time for fun activities. Alcohol use Do not drink alcohol if: Your health care provider tells you not to drink. You are pregnant, may be pregnant, or are planning to become pregnant. If you drink alcohol: Limit how much you have to: 0-1 drink a day for women. 0-2 drinks a day for men. Know how much alcohol is in your drink. In the [place] one drink equals one 12 oz bottle of beer (355 mL), one 5 oz glass of wine (148 mL), or one 1 oz glass of hard liquor (44 mL). Medicines Your health care provider may prescribe medicine if lifestyle changes are not enough to get your blood pressure under control and if: Your systolic blood pressure is 130 or higher. Your diastolic blood pressure is 80 or higher. Take medicines only as told by your health care provider. Follow the directions carefully. Blood pressure medicines must be taken as told by your health care provider. The medicine does not work as well when you skip doses. Skipping doses also puts you at risk for problems. Monitoring Before you monitor your blood pressure: Do not smoke, drink caffeinated beverages, or exercise within 30 minutes before taking a measurement. Use the bathroom and empty your bladder (urinate). Sit quietly for at least 5 minutes before taking measurements. Monitor your blood pressure at home as told by your health care provider. To do this: Sit with your back straight and supported. Place your feet flat on the floor. Do not cross your legs. Support your arm on a flat surface, such as a table. Make sure your upper arm is at heart level. Each time you measure, take two or three readings one minute apart and record the results. You may also need to have your blood pressure checked regularly by  your health care provider. General information Talk with your health care provider about your diet, exercise habits, and other lifestyle factors that may be contributing to hypertension. Review all the medicines you take with your health care provider because there may be side effects or interactions. Keep all follow-up visits. Your health care provider can help you create and adjust your plan for managing your high blood pressure. Where to find more information National Heart, Lung, and Blood Institute: www.nhlbi.nih.gov American Heart Association: www.heart.org Contact a health care provider if: You think you are having a reaction to medicines you have taken. You have repeated (recurrent) headaches. You feel dizzy. You have swelling in your ankles. You have trouble with your vision. Get help right away if: You develop a severe headache or confusion. You have unusual weakness or numbness, or you feel faint. You have severe pain in your chest or abdomen. You vomit repeatedly. You have trouble breathing. These symptoms may be an emergency. Get help right away. Call 911. Do not wait to see if the symptoms will go away. Do not drive yourself to the hospital. Summary Hypertension is when the force of blood pumping through your arteries is too strong. If this condition is not controlled, it may put you at risk for serious complications. Your personal target blood pressure may vary depending on your medical conditions,   your age, and other factors. For most people, a normal blood pressure is less than 120/80. Hypertension is managed by lifestyle changes, medicines, or both. Lifestyle changes to help manage hypertension include losing weight, eating a healthy, low-sodium diet, exercising more, stopping smoking, and limiting alcohol. This information is not intended to replace advice given to you by your health care provider. Make sure you discuss any questions you have with your health care  provider. Document Revised: 11/03/2020 Document Reviewed: 11/03/2020 Elsevier Patient Education  2024 [employer]vier Inc.  

## 2022-08-11 LAB — COMPLETE METABOLIC PANEL WITH GFR
AG Ratio: 1.4 (calc) (ref 1.0–2.5)
ALT: 35 U/L — ABNORMAL HIGH (ref 6–29)
AST: 48 U/L — ABNORMAL HIGH (ref 10–35)
Albumin: 4.9 g/dL (ref 3.6–5.1)
Alkaline phosphatase (APISO): 76 U/L (ref 37–153)
BUN: 18 mg/dL (ref 7–25)
CO2: 28 mmol/L (ref 20–32)
Calcium: 9.7 mg/dL (ref 8.6–10.4)
Chloride: 100 mmol/L (ref 98–110)
Creat: 0.8 mg/dL (ref 0.60–1.00)
Globulin: 3.4 g/dL (calc) (ref 1.9–3.7)
Glucose, Bld: 106 mg/dL — ABNORMAL HIGH (ref 65–99)
Potassium: 4.4 mmol/L (ref 3.5–5.3)
Sodium: 139 mmol/L (ref 135–146)
Total Bilirubin: 1 mg/dL (ref 0.2–1.2)
Total Protein: 8.3 g/dL — ABNORMAL HIGH (ref 6.1–8.1)
eGFR: 76 mL/min/{1.73_m2} (ref >=60)

## 2022-08-11 LAB — CBC WITH DIFFERENTIAL/PLATELET
Absolute Monocytes: 694 cells/uL (ref 200–950)
Basophils Absolute: 39 cells/uL (ref 0–200)
Basophils Relative: 0.5 %
Eosinophils Absolute: 203 cells/uL (ref 15–500)
HCT: 42.1 % (ref 35.0–45.0)
Hemoglobin: 14.4 g/dL (ref 11.7–15.5)
MCH: 31.3 pg (ref 27.0–33.0)
MCHC: 34.2 g/dL (ref 32.0–36.0)
MCV: 91.5 fL (ref 80.0–100.0)
Monocytes Relative: 8.9 %
Neutro Abs: 5413 cells/uL (ref 1500–7800)
Neutrophils Relative %: 69.4 %
Platelets: 222 10*3/uL (ref 140–400)
RDW: 12.3 % (ref 11.0–15.0)
WBC: 7.8 10*3/uL (ref 3.8–10.8)

## 2022-08-23 DIAGNOSIS — M79604 Pain in right leg: Secondary | ICD-10-CM | POA: Diagnosis not present

## 2022-08-29 NOTE — Progress Notes (Unsigned)
Assessment and Plan:  There are no diagnoses linked to this encounter.    Further disposition pending results of labs. Discussed med's effects and SE's.   Over 30 minutes of exam, counseling, chart review, and critical decision making was performed.   Future Appointments  Date Time Provider Department Center  08/30/2022  9:00 AM Raynelle Dick, NP [facility] None  09/19/2022 12:35 PM Sherrie George, MD [facility] None  10/24/2022 11:30 AM Raynelle Dick, NP [facility] None  06/26/2023  2:00 PM Raynelle Dick, NP [facility] None    ------------------------------------------------------------------------------------------------------------------   HPI There were no vitals taken for this visit. 77 y.o.female presents for  Past Medical History:  Diagnosis Date   Allergy    Essential tremor    GERD (gastroesophageal reflux disease)    Heart murmur    High cholesterol    Hyperlipidemia    Hypertension    labile   Labile hypertension    Prediabetes      Allergies  Allergen Reactions   Ampicillin Hives and Swelling   Aspirin Itching   Doxycycline Nausea And Vomiting   Macrodantin [Nitrofurantoin Macrocrystal] Hives, Itching and Swelling   Percocet [Oxycodone-Acetaminophen]    Propulsid [Cisapride] Itching and Swelling   Voltaren [Diclofenac] Hives and Itching   Codeine Palpitations   Penicillins Swelling and Rash    Current Outpatient Medications on File Prior to Visit  Medication Sig   ALPRAZolam (XANAX) 0.25 MG tablet Take 1/2 to 1 tablet 3 x daily as need for anxiety (Patient not taking: Reported on 08/10/2022)   Ascorbic Acid (VITAMIN C) 500 MG CAPS Take by mouth.   betamethasone dipropionate (DIPROLENE) 0.05 % ointment Apply topically 2 (two) times daily.   Bevacizumab (AVASTIN IV) Inject into the vein. Every 10 weeks   Biotin 5000 MCG CAPS Take by mouth.   chlorhexidine (PERIDEX) 0.12 % solution SMARTSIG:0.5 Ounce(s) By Mouth Morning-Night   Cholecalciferol  (D3 PO) Take 2,000 Units by mouth daily.   losartan (COZAAR) 25 MG tablet Take 1 tablet (25 mg total) by mouth daily. (Patient not taking: Reported on 11/08/2021)   No current facility-administered medications on file prior to visit.    ROS: all negative except above.   Physical Exam:  There were no vitals taken for this visit.  General Appearance: Well nourished, in no apparent distress. Eyes: PERRLA, EOMs, conjunctiva no swelling or erythema Sinuses: No Frontal/maxillary tenderness ENT/Mouth: Ext aud canals clear, TMs without erythema, bulging. No erythema, swelling, or exudate on post pharynx.  Tonsils not swollen or erythematous. Hearing normal.  Neck: Supple, thyroid normal.  Respiratory: Respiratory effort normal, BS equal bilaterally without rales, rhonchi, wheezing or stridor.  Cardio: RRR with no MRGs. Brisk peripheral pulses without edema.  Abdomen: Soft, + BS.  Non tender, no guarding, rebound, hernias, masses. Lymphatics: Non tender without lymphadenopathy.  Musculoskeletal: Full ROM, 5/5 strength, normal gait.  Skin: Warm, dry without rashes, lesions, ecchymosis.  Neuro: Cranial nerves intact. Normal muscle tone, no cerebellar symptoms. Sensation intact.  Psych: Awake and oriented X 3, normal affect, Insight and Judgment appropriate.     Raynelle Dick, NP 12:11 PM [facility]

## 2022-08-30 ENCOUNTER — Emergency Department (HOSPITAL_BASED_OUTPATIENT_CLINIC_OR_DEPARTMENT_OTHER)
Admission: EM | Admit: 2022-08-30 | Discharge: 2022-08-30 | Disposition: A | Discharge status: Home / Self Care | Payer: Medicare PPO | Attending: Emergency Medicine | Admitting: Emergency Medicine

## 2022-08-30 ENCOUNTER — Ambulatory Visit: Payer: Medicare PPO | Admitting: Nurse Practitioner

## 2022-08-30 ENCOUNTER — Encounter (HOSPITAL_BASED_OUTPATIENT_CLINIC_OR_DEPARTMENT_OTHER): Payer: Self-pay | Admitting: Emergency Medicine

## 2022-08-30 ENCOUNTER — Other Ambulatory Visit: Payer: Self-pay

## 2022-08-30 ENCOUNTER — Emergency Department (HOSPITAL_BASED_OUTPATIENT_CLINIC_OR_DEPARTMENT_OTHER): Payer: Medicare PPO

## 2022-08-30 ENCOUNTER — Other Ambulatory Visit (HOSPITAL_BASED_OUTPATIENT_CLINIC_OR_DEPARTMENT_OTHER): Payer: Self-pay

## 2022-08-30 ENCOUNTER — Encounter: Payer: Self-pay | Admitting: Nurse Practitioner

## 2022-08-30 VITALS — BP 162/72 | HR 77 | Temp 97.9°F | Ht 63.5 in | Wt 146.6 lb

## 2022-08-30 DIAGNOSIS — I10 Essential (primary) hypertension: Secondary | ICD-10-CM

## 2022-08-30 DIAGNOSIS — Z91148 Patient's other noncompliance with medication regimen for other reason: Secondary | ICD-10-CM

## 2022-08-30 DIAGNOSIS — M79661 Pain in right lower leg: Secondary | ICD-10-CM

## 2022-08-30 DIAGNOSIS — M25461 Effusion, right knee: Secondary | ICD-10-CM | POA: Diagnosis not present

## 2022-08-30 DIAGNOSIS — M7121 Synovial cyst of popliteal space [Baker], right knee: Secondary | ICD-10-CM | POA: Diagnosis not present

## 2022-08-30 DIAGNOSIS — M7989 Other specified soft tissue disorders: Secondary | ICD-10-CM | POA: Diagnosis not present

## 2022-08-30 NOTE — ED Provider Notes (Signed)
[place] EMERGENCY DEPARTMENT AT [facility] Provider Note   CSN: [id number] Arrival date & time: 08/30/22  4401     History  Chief Complaint  Patient presents with   Leg Swelling    Margaret Sullivan is a 77 y.o. female with uncontrolled hypertension, presents with right calf and knee swelling for the past week.  She states she was sitting at her desk when she noticed the swelling on 6/20.  The swelling has fluctuated but it has not completely resolved.  It has spread more to the knee.  She notes a long 6-hour car ride on the 17th, otherwise denies any other periods of immobilization, hormone use, malignancy.  She does note a fall onto her right foot/leg that was sustained 6/5.  She was evaluated and no x-rays were obtained, suspected soft tissue injury.  Has been ambulating without difficulty.  States she also was walking more than normal last week.  Denies any shortness of breath, chest pain, calf pain.  HPI     Home Medications Prior to Admission medications   Medication Sig Start Date End Date Taking? Authorizing Provider  Acetaminophen (TYLENOL PO) Take by mouth.    [provider]  Ascorbic Acid (VITAMIN C) 500 MG CAPS Take by mouth.    [provider]  betamethasone dipropionate (DIPROLENE) 0.05 % ointment Apply topically 2 (two) times daily.    [provider]  Bevacizumab (AVASTIN IV) Inject into the vein. Every 10 weeks    [provider]  Biotin 5000 MCG CAPS Take by mouth.    [provider]  Cholecalciferol (D3 PO) Take 2,000 Units by mouth daily.    [provider]      Allergies    Ampicillin, Aspirin, Doxycycline, Macrodantin [nitrofurantoin macrocrystal], Percocet [oxycodone-acetaminophen], Propulsid [cisapride], Voltaren [diclofenac], Codeine, and Penicillins    Review of Systems   Review of Systems  Respiratory:  Negative for shortness of breath.   Cardiovascular:  Negative for chest pain.   Musculoskeletal:        Right calf and knee swelling    Physical Exam Updated Vital Signs BP (!) 193/68 (BP Location: Right Arm)   Pulse 89   Temp 98.4 F (36.9 C) (Oral)   Resp 18   Ht 5' 3.75" (1.619 m)   Wt 66.2 kg   SpO2 99%   BMI 25.26 kg/m  Physical Exam Vitals and nursing note reviewed.  Constitutional:      Appearance: Normal appearance.  Cardiovascular:     Rate and Rhythm: Normal rate and regular rhythm.     Heart sounds: No murmur heard.    Comments: 2+ posterior tibialis and dorsalis pedis pulses bilaterally Pulmonary:     Effort: Pulmonary effort is normal.     Breath sounds: Normal breath sounds.  Musculoskeletal:        General: Swelling present. No deformity.     Comments: Right lower extremity  Right ankle, calf, knee swelling.  The knee swelling most prominent on the medial side  No tenderness to palpation over the knee diffusely, mild tenderness along the medial joint compartment.  No tenderness to palpation over the calf, medial or lateral malleolus, foot.  No laxity on anterior posterior drawer test.  No pain on stress of the LCL, MCL.   Knee flexion limited to approximately 100 degrees due to edema.  Full range of motion of the ankle  Skin:    Findings: No bruising, erythema or rash.  Neurological:  Mental Status: She is alert.     ED Results / Procedures / Treatments   Labs (all labs ordered are listed, but only abnormal results are displayed) Labs Reviewed - No data to display  EKG None  Radiology US Venous Img Lower Unilateral Right  Result Date: 08/30/2022 CLINICAL DATA:  Leg and knee swelling EXAM: RIGHT LOWER EXTREMITY VENOUS DOPPLER ULTRASOUND TECHNIQUE: Gray-scale sonography with graded compression, as well as color Doppler and duplex ultrasound were performed to evaluate the lower extremity deep venous systems from the level of the common femoral vein and including the common femoral, femoral, profunda femoral, popliteal and  calf veins including the posterior tibial, peroneal and gastrocnemius veins when visible. The superficial great saphenous vein was also interrogated. Spectral Doppler was utilized to evaluate flow at rest and with distal augmentation maneuvers in the common femoral, femoral and popliteal veins. COMPARISON:  None Available. FINDINGS: Contralateral Common Femoral Vein: Respiratory phasicity is normal and symmetric with the symptomatic side. No evidence of thrombus. Normal compressibility. Common Femoral Vein: No evidence of thrombus. Normal compressibility, respiratory phasicity and response to augmentation. Saphenofemoral Junction: No evidence of thrombus. Normal compressibility and flow on color Doppler imaging. Profunda Femoral Vein: No evidence of thrombus. Normal compressibility and flow on color Doppler imaging. Femoral Vein: No evidence of thrombus. Normal compressibility, respiratory phasicity and response to augmentation. Popliteal Vein: No evidence of thrombus. Normal compressibility, respiratory phasicity and response to augmentation. Calf Veins: No evidence of thrombus. Normal compressibility and flow on color Doppler imaging. Other Findings: In the popliteal fossa, there is a anechoic area of fluid that measures 7.6 x 5.0 x 1.9 cm, with a smaller anechoic area of fluid tracking inferiorly measuring 4.2 x 1.4 x 0.8 cm. IMPRESSION: 1. No findings of right lower extremity DVT. 2. In the right popliteal fossa, there is an area of fluid measuring 7.6 x 5.0 x 1.9 cm, with a smaller area tracking inferiorly measuring 4.2 x 1.4 x 0.8 cm. Findings could represent a ruptured Baker's cyst. Electronically Signed   By: Olive Bass M.D.   On: 08/30/2022 11:27    Procedures Procedures    Medications Ordered in ED Medications - No data to display  ED Course/ Medical Decision Making/ A&P                             Medical Decision Making  77 y.o. female with pertinent past medical history of  hypertension, not on medications presents to the ED for concern of right calf and knee swelling for 7 days  Differential diagnosis includes but is not limited to DVT, soft tissue injury, cellulitis, meniscus injury, acl injury, pcl injury, patellofemoral arthritis, Baker's cyst  ED Course:  Patient with right calf and knee swelling, no tenderness to palpation of the calf, only medial joint line tenderness of the knee, no tachycardia, no shortness of breath, no chest pain.  Posterior tibialis and dorsalis pedis 2+ bilaterally. Will obtain DVT study to rule out clot, no major risk factors for clot except for 6 hour car ride on 6/17. No overlying erythema or rashes, no concern for cellulitis at this time.  Due to her injury, this could be soft tissue swelling secondary to this injury.  She has tenderness over the medial joint line of the right knee, since she has been walking more than normal could be swelling due to patellofemoral arthritis. Do not think any x-ray is warranted at this time  given no tenderness to palpation, no obvious deformity, patient is able to ambulate. DVT study with no signs of clot.  Swelling could be due to ruptured ruptured Baker's cyst.  Discharged with instructions to elevate her leg and to not participate in any strenuous physical activity.  May participate in light physical activity such as walking.  Return precautions given  Impression: Right knee effusion  Disposition:  The patient was discharged home with instructions to follow-up with PCP if her swelling has not began to improve in the next week.  Elevate her leg above the level of the heart for 30 minutes 3 times a day.  Return precautions given   Lab Tests: Not indicated  Imaging Studies ordered: I ordered imaging studies including venous ultrasound right lower extremity I independently visualized the imaging with scope of interpretation limited to determining acute life threatening conditions related to  emergency care. Imaging showed no concern for DVT, fluid in popliteal fossa could represent ruptured Baker's cyst I agree with the radiologist interpretation   Cardiac Monitoring: / EKG: Not indicated   Consultations Obtained: Not indicated   Co morbidities that complicate the patient evaluation  Hypertension, uncontrolled  Social Determinants of Health:  unknown              Final Clinical Impression(s) / ED Diagnoses Final diagnoses:  Swelling of right knee joint    Rx / DC Orders ED Discharge Orders     None         Arabella Merles, PA-C 08/30/22 1154    Derwood Kaplan, MD 08/30/22 1512

## 2022-08-30 NOTE — ED Notes (Signed)
ED Provider at bedside. 

## 2022-08-30 NOTE — ED Notes (Signed)
Discharge paperwork given and verbally understood. 

## 2022-08-30 NOTE — ED Triage Notes (Signed)
Pt arrives pov, slow gait with walker, referred by pcp with c/o RLE pain and swelling to r/o DVT. Was seen last week for same. Endorses fall ~ 3 weeks pta.

## 2022-08-30 NOTE — Discharge Instructions (Addendum)
Your ultrasound showed no signs of a clot (DVT).   Please elevate your leg above the level of your heart for at least 30 minutes 3 times a day. You may engage in light physical activity such as walking but please do not engage in any strenuous physical activity.   Please follow-up with your PCP if your swelling has not improved within the next week.  Return the ER if you have shortness of breath, chest pain, pain in your calf.

## 2022-09-19 ENCOUNTER — Encounter (INDEPENDENT_AMBULATORY_CARE_PROVIDER_SITE_OTHER): Payer: Medicare PPO | Admitting: Ophthalmology

## 2022-09-19 DIAGNOSIS — H31003 Unspecified chorioretinal scars, bilateral: Secondary | ICD-10-CM

## 2022-09-19 DIAGNOSIS — H43813 Vitreous degeneration, bilateral: Secondary | ICD-10-CM | POA: Diagnosis not present

## 2022-09-19 DIAGNOSIS — H318 Other specified disorders of choroid: Secondary | ICD-10-CM

## 2022-09-19 DIAGNOSIS — H2513 Age-related nuclear cataract, bilateral: Secondary | ICD-10-CM | POA: Diagnosis not present

## 2022-10-02 NOTE — Progress Notes (Unsigned)
Assessment and Plan:  Margaret Sullivan was seen today for acute visit.  Diagnoses and all orders for this visit:  Uncontrolled hypertension Continue to suggest BP medication for control but continue to refuse medication. She  verbalized that she knows this is against medical advice and is noncompliant. She has been counseled on the risks of untreated hypertension and verbalizes understanding of these risks.  She is to get new BP cuff as her current one is not reading correctly when calibrated with our BP cuff Go to the ER if any chest pain, shortness of breath, nausea, dizziness, severe HA, changes vision/speech   Noncompliance with medication regimen Refuses medication, continues to want to try lifestyle changes- diet and exercise  Right knee pain Given stretching and strengthening exercises.  F/U at next appointment      Further disposition pending results of labs. Discussed med's effects and SE's.   Over 30 minutes of exam, counseling, chart review, and critical decision making was performed.   Future Appointments  Date Time Provider Department Center  10/24/2022 11:30 AM Raynelle Dick, NP [facility] None  12/12/2022 12:20 PM Sherrie George, MD [facility] None  02/06/2023  9:30 AM Lucky Cowboy, MD [facility] None  06/26/2023  2:00 PM Raynelle Dick, NP [facility] None    ------------------------------------------------------------------------------------------------------------------   HPI BP (!) 162/84   Pulse 73   Temp 97.7 F (36.5 C)   Ht 5' 3.5" (1.613 m)   Wt 142 lb 3.2 oz (64.5 kg)   SpO2 96%   BMI 24.79 kg/m   77 y.o.female presents for leg swelling and was seen for right leg/ankle swelling and pain- was evaluated at [facility]  walk in clinic and advised soft tissue inflammation. She fell 08/08/22 and was limping and still had some swelling but no pain.  ER visit on 08/30/22: DVT study with no signs of clot.  Swelling could be due to ruptured ruptured Baker's cyst.   Discharged with instructions to elevate her leg and to not participate in any strenuous physical activity.  May participate in light physical activity such as walking.  Return precautions given Ultrasound  1. No findings of right lower extremity DVT. 2. In the right popliteal fossa, there is an area of fluid measuring 7.6 x 5.0 x 1.9 cm, with a smaller area tracking inferiorly measuring 4.2 x 1.4 x 0.8 cm. Findings could represent a ruptured Baker's cyst.  She continues to have a little pain and swelling of her right knee.The pain, heat and swelling have improved over the past 7 weeks. Continues to have some swelling in the right knee. She had been isolating the right leg and keeping elevated several times throughout the day.    Pt has uncontrolled hypertension and has refused medication every visit and has verbalized that she knows this is against medical advice and is noncompliant. She has been counseled on the risks of untreated hypertension and verbalizes understanding of these risks. Her BP cuff was used and read 141/80 not calibrated with our cuff- needs new cuff.  BP Readings from Last 3 Encounters:  10/03/22 (!) 162/84  08/30/22 (!) 188/73  08/30/22 (!) 162/72   BMI is Body mass index is 24.79 kg/m., she has been working on diet and exercise. She has lost 6 pounds in the past month.  Wt Readings from Last 3 Encounters:  10/03/22 142 lb 3.2 oz (64.5 kg)  08/30/22 146 lb (66.2 kg)  08/30/22 146 lb 9.6 oz (66.5 kg)   She sees Dr. Ashley Royalty  q 12 weeks for left eye injections. She is on Avastin injections.    Past Medical History:  Diagnosis Date   Allergy    Essential tremor    GERD (gastroesophageal reflux disease)    Heart murmur    High cholesterol    Hyperlipidemia    Hypertension    labile   Labile hypertension    Prediabetes      Allergies  Allergen Reactions   Ampicillin Hives and Swelling   Aspirin Itching   Doxycycline Nausea And Vomiting   Macrodantin  [Nitrofurantoin Macrocrystal] Hives, Itching and Swelling   Percocet [Oxycodone-Acetaminophen]    Propulsid [Cisapride] Itching and Swelling   Voltaren [Diclofenac] Hives and Itching   Codeine Palpitations   Penicillins Swelling and Rash    Current Outpatient Medications on File Prior to Visit  Medication Sig   Acetaminophen (TYLENOL PO) Take by mouth.   Ascorbic Acid (VITAMIN C) 500 MG CAPS Take by mouth.   betamethasone dipropionate (DIPROLENE) 0.05 % ointment Apply topically 2 (two) times daily.   Bevacizumab (AVASTIN IV) Inject into the vein. Every 10 weeks   Cholecalciferol (D3 PO) Take 2,000 Units by mouth daily.   Biotin 5000 MCG CAPS Take by mouth. (Patient not taking: Reported on 10/03/2022)   No current facility-administered medications on file prior to visit.    ROS: all negative except above.   Physical Exam:  BP (!) 162/84   Pulse 73   Temp 97.7 F (36.5 C)   Ht 5' 3.5" (1.613 m)   Wt 142 lb 3.2 oz (64.5 kg)   SpO2 96%   BMI 24.79 kg/m   General Appearance: Well nourished, in no apparent distress. Eyes: PERRLA, EOMs, conjunctiva no swelling or erythema Neck: Supple, thyroid normal.  Respiratory: Respiratory effort normal, BS equal bilaterally without rales, rhonchi, wheezing or stridor.  Cardio: RRR with no MRGs. Difficulty finding right popliteal pulse, right lower leg 2 + pitting edema and warm to touch Abdomen: Soft, + BS.  Non tender, no guarding, rebound, hernias, masses. Musculoskeletal: Full ROM, 5/5 strength, normal gait. Right knee has minimal swelling of medial area.  No pain with pressure or internal/external rotation. Skin: Warm, dry without rashes, lesions, ecchymosis.  Neuro: Cranial nerves intact. Normal muscle tone, no cerebellar symptoms. Sensation intact.  Psych: Awake and oriented X 3, normal affect, Insight and Judgment appropriate.     Raynelle Dick, NP 11:17 AM [facility]

## 2022-10-03 ENCOUNTER — Ambulatory Visit: Payer: Medicare PPO | Admitting: Nurse Practitioner

## 2022-10-03 ENCOUNTER — Encounter: Payer: Self-pay | Admitting: Nurse Practitioner

## 2022-10-03 VITALS — BP 162/84 | HR 73 | Temp 97.7°F | Ht 63.5 in | Wt 142.2 lb

## 2022-10-03 DIAGNOSIS — I10 Essential (primary) hypertension: Secondary | ICD-10-CM | POA: Diagnosis not present

## 2022-10-03 DIAGNOSIS — M25561 Pain in right knee: Secondary | ICD-10-CM

## 2022-10-03 NOTE — Patient Instructions (Signed)
Knee Exercises Ask your health care provider which exercises are safe for you. Do exercises exactly as told by your health care provider and adjust them as directed. It is normal to feel mild stretching, pulling, tightness, or discomfort as you do these exercises. Stop right away if you feel sudden pain or your pain gets worse. Do not begin these exercises until told by your health care provider. Stretching and range-of-motion exercises These exercises warm up your muscles and joints and improve the movement and flexibility of your knee. These exercises also help to relieve pain and swelling. Knee extension, prone  Lie on your abdomen (prone position) on a bed. Place your left / right knee just beyond the edge of the surface so your knee is not on the bed. You can put a towel under your left / right thigh just above your kneecap for comfort. Relax your leg muscles and allow gravity to straighten your knee (extension). You should feel a stretch behind your left / right knee. Hold this position for ____5______ seconds. Scoot up so your knee is supported between repetitions. Repeat ____5______ times. Complete this exercise _______2___ times a day. Knee flexion, active  Lie on your back with both legs straight. If this causes back discomfort, bend your left / right knee so your foot is flat on the floor. Slowly slide your left / right heel back toward your buttocks. Stop when you feel a gentle stretch in the front of your knee or thigh (flexion). Hold this position for _____10_____ seconds. Slowly slide your left / right heel back to the starting position. Repeat ____5______ times. Complete this exercise _____2_____ times a day. Quadriceps stretch, prone  Lie on your abdomen on a firm surface, such as a bed or padded floor. Bend your left / right knee and hold your ankle. If you cannot reach your ankle or pant leg, loop a belt around your foot and grab the belt instead. Gently pull your heel  toward your buttocks. Your knee should not slide out to the side. You should feel a stretch in the front of your thigh and knee (quadriceps). Hold this position for ___10_______ seconds. Repeat ____5______ times. Complete this exercise ___2_______ times a day. Hamstring, supine  Lie on your back (supine position). Loop a belt or towel over the ball of your left / right foot. The ball of your foot is on the walking surface, right under your toes. Straighten your left / right knee and slowly pull on the belt to raise your leg until you feel a gentle stretch behind your knee (hamstring). Do not let your knee bend while you do this. Keep your other leg flat on the floor. Hold this position for _____10____ seconds. Repeat ______5____ times. Complete this exercise ____2______ times a day. Strengthening exercises These exercises build strength and endurance in your knee. Endurance is the ability to use your muscles for a long time, even after they get tired. Quadriceps, isometric This exercise strengthens the muscles in front of your thigh (quadriceps) without moving your knee joint (isometric). Lie on your back with your left / right leg extended and your other knee bent. Put a rolled towel or small pillow under your knee if told by your health care provider. Slowly tense the muscles in the front of your left / right thigh. You should see your kneecap slide up toward your hip or see increased dimpling just above the knee. This motion will push the back of the knee toward the floor.  For _____10_____ seconds, hold the muscle as tight as you can without increasing your pain. Relax the muscles slowly and completely. Repeat ___5_______ times. Complete this exercise ____2______ times a day. Straight leg raises This exercise strengthens the muscles in front of your thigh (quadriceps) and the muscles that move your hips (hip flexors). Lie on your back with your left / right leg extended and your other knee  bent. Tense the muscles in the front of your left / right thigh. You should see your kneecap slide up or see increased dimpling just above the knee. Your thigh may even shake a bit. Keep these muscles tight as you raise your leg 4-6 inches (10-15 cm) off the floor. Do not let your knee bend. Hold this position for __10________ seconds. Keep these muscles tense as you lower your leg. Relax your muscles slowly and completely after each repetition. Repeat ___5_______ times. Complete this exercise _______2___ times a day.  Straight leg raises, side-lying This exercise strengthens the muscles that rotate the leg at the hip and move it away from your body (hip abductors). Lie on your side with your left / right leg in the top position. Lie so your head, shoulder, knee, and hip line up. You may bend your bottom knee to help you keep your balance. Roll your hips slightly forward so your hips are stacked directly over each other and your left / right knee is facing forward. Leading with your heel, lift your top leg 4-6 inches (10-15 cm). You should feel the muscles in your outer hip lifting. Do not let your foot drift forward. Do not let your knee roll toward the ceiling. Hold this position for __5-10________ seconds. Slowly return your leg to the starting position. Let your muscles relax completely after each repetition. Repeat ___5_______ times. Complete this exercise ______2____ times a day.  This information is not intended to replace advice given to you by your health care provider. Make sure you discuss any questions you have with your health care provider. Document Revised: 11/01/2020 Document Reviewed: 11/01/2020 Elsevier Patient Education  2024 [employer].

## 2022-10-23 NOTE — Progress Notes (Deleted)
Assessment and Plan: Margaret Sullivan was seen today for follow-up.  Diagnoses and all orders for this visit:  Essential hypertension - Start Losartan 25 mg QD and keep BP log and follow up in 3 months - Extensive time taken > 20 minutes reviewing the risks of untreated hypertension - continue DASH diet, exercise and monitor at home. Call if greater than 130/80.   Mixed hyperlipidemia Continues to decline medications Low discussion on risks of untreated hyperliidemia Follow upin 3 months  Noncompliance with medication regimen - Discussed that she is noncompliant with treatment regimen as BP and lipid levels remain uncontrolled and has declined all medication to this point      Further disposition pending results of labs. Discussed med's effects and SE's.   Over 30 minutes of exam, counseling, chart review, and critical decision making was performed.   Future Appointments  Date Time Provider Department Center  10/24/2022 11:30 AM Raynelle Dick, NP [facility] None  12/12/2022 12:20 PM Sherrie George, MD [facility] None  02/06/2023  9:30 AM Lucky Cowboy, MD [facility] None  06/26/2023  2:00 PM Raynelle Dick, NP [facility] None    ------------------------------------------------------------------------------------------------------------------   HPI There were no vitals taken for this visit.  77 y.o.female presents for reevaluation of BP.  She has not been taking her Losartan  At last visit she was strongly encouraged to start Losartan 25 mg QD and keep BP log daily.  She did not start the medication. Home logs show BP 144-192/70's BP Readings from Last 3 Encounters:  10/03/22 (!) 162/84  08/30/22 (!) 188/73  08/30/22 (!) 162/72   BMI is There is no height or weight on file to calculate BMI., she has not been working on diet and exercise. Wt Readings from Last 3 Encounters:  10/03/22 142 lb 3.2 oz (64.5 kg)  08/30/22 146 lb (66.2 kg)  08/30/22 146 lb 9.6 oz (66.5 kg)     Pt was encouraged to start Rosuvastatin 5 mg daily but patient declined.  Lab Results  Component Value Date   CHOL 231 (H) 06/26/2022   HDL 39 (L) 06/26/2022   LDLCALC 153 (H) 06/26/2022   TRIG 232 (H) 06/26/2022   CHOLHDL 5.9 (H) 06/26/2022     Past Medical History:  Diagnosis Date   Allergy    Essential tremor    GERD (gastroesophageal reflux disease)    Heart murmur    High cholesterol    Hyperlipidemia    Hypertension    labile   Labile hypertension    Prediabetes      Allergies  Allergen Reactions   Ampicillin Hives and Swelling   Aspirin Itching   Doxycycline Nausea And Vomiting   Macrodantin [Nitrofurantoin Macrocrystal] Hives, Itching and Swelling   Percocet [Oxycodone-Acetaminophen]    Propulsid [Cisapride] Itching and Swelling   Voltaren [Diclofenac] Hives and Itching   Codeine Palpitations   Penicillins Swelling and Rash    Current Outpatient Medications on File Prior to Visit  Medication Sig   Acetaminophen (TYLENOL PO) Take by mouth.   Ascorbic Acid (VITAMIN C) 500 MG CAPS Take by mouth.   betamethasone dipropionate (DIPROLENE) 0.05 % ointment Apply topically 2 (two) times daily.   Bevacizumab (AVASTIN IV) Inject into the vein. Every 10 weeks   Biotin 5000 MCG CAPS Take by mouth. (Patient not taking: Reported on 10/03/2022)   Cholecalciferol (D3 PO) Take 2,000 Units by mouth daily.   No current facility-administered medications on file prior to visit.    ROS: all negative  except above.   Physical Exam:  There were no vitals taken for this visit.  General Appearance: Pleasant, slightly anxious female, in no apparent distress. Eyes: PERRLA, EOMs, conjunctiva no swelling or erythema Neck: Supple, thyroid normal.  Respiratory: Respiratory effort normal, BS equal bilaterally without rales, rhonchi, wheezing or stridor.  Cardio: RRR with no MRGs. Brisk peripheral pulses without edema.  Abdomen: Soft, + BS.  Non tender, no guarding, rebound,  hernias, masses. Lymphatics: Non tender without lymphadenopathy.  Musculoskeletal: Full ROM, 5/5 strength, normal gait.  Skin: Warm, dry without rashes, lesions, ecchymosis.  Neuro: Cranial nerves intact. Normal muscle tone, no cerebellar symptoms. Sensation intact.  Psych: Awake and oriented X 3, normal affect, Insight and Judgment appropriate.     Raynelle Dick, NP 12:40 PM [facility]

## 2022-10-24 ENCOUNTER — Ambulatory Visit: Payer: Medicare PPO | Admitting: Nurse Practitioner

## 2022-10-24 DIAGNOSIS — E782 Mixed hyperlipidemia: Secondary | ICD-10-CM

## 2022-10-24 DIAGNOSIS — F419 Anxiety disorder, unspecified: Secondary | ICD-10-CM

## 2022-10-24 DIAGNOSIS — K760 Fatty (change of) liver, not elsewhere classified: Secondary | ICD-10-CM

## 2022-10-24 DIAGNOSIS — Z79899 Other long term (current) drug therapy: Secondary | ICD-10-CM

## 2022-10-24 DIAGNOSIS — Z91148 Patient's other noncompliance with medication regimen for other reason: Secondary | ICD-10-CM

## 2022-10-24 DIAGNOSIS — I10 Essential (primary) hypertension: Secondary | ICD-10-CM

## 2022-10-24 DIAGNOSIS — R7309 Other abnormal glucose: Secondary | ICD-10-CM

## 2022-10-24 DIAGNOSIS — D649 Anemia, unspecified: Secondary | ICD-10-CM

## 2022-10-24 DIAGNOSIS — E559 Vitamin D deficiency, unspecified: Secondary | ICD-10-CM

## 2022-10-24 DIAGNOSIS — E663 Overweight: Secondary | ICD-10-CM

## 2022-10-31 NOTE — Progress Notes (Unsigned)
Assessment and Plan: Eugenia was seen today for follow-up.  Diagnoses and all orders for this visit:  Uncontrolled hypertension Continue to suggest BP medication for control but continue to refuse medication. She  verbalized that she knows this is against medical advice and is noncompliant. She has been counseled on the risks of untreated hypertension and verbalizes understanding of these risks.  New BP cuff calibrated with ours correctly today Go to the ER if any chest pain, shortness of breath, nausea, dizziness, severe HA, changes vision/speech -CBC - CMP     Noncompliance with medication regimen Refuses medication, continues to want to try lifestyle changes- diet and exercise   Mixed hyperlipidemia Continue to work on diet and exercise Continues to decline medication at this time -     Lipid panel  Anxiety Given behavioral handout for anxiety Will use Buspar prior to doctors appointments -     busPIRone (BUSPAR) 5 MG tablet; 1 tab daily as needed for anxiety  Abnormal glucose Continue diet, exercise and weight loss -     COMPLETE METABOLIC PANEL WITH GFR -     Insulin, random  Vitamin D deficiency Continue Vit D supplementation to maintain value in therapeutic level of 60-100   BMI 24 Continue diet and exercise  Medication management -     CBC with Differential/Platelet -     COMPLETE METABOLIC PANEL WITH GFR -     Magnesium -     Lipid panel -     Insulin, random  Fatty liver Weight loss with low processed carbohydrate diet recommended Limit tylenol/alcohol Monitor LFT trend and follow up US if trending up     Further disposition pending results of labs. Discussed med's effects and SE's.   Over 30 minutes of exam, counseling, chart review, and critical decision making was performed.   Future Appointments  Date Time Provider Department Center  12/12/2022 12:20 PM Sherrie George, MD [facility] None  02/06/2023  9:30 AM Lucky Cowboy, MD [facility] None   06/26/2023  2:00 PM Raynelle Dick, NP [facility] None    ------------------------------------------------------------------------------------------------------------------   HPI BP (!) 160/78   Pulse 76   Temp 98.1 F (36.7 C)   Ht 5' 3.5" (1.613 m)   Wt 140 lb 12.8 oz (63.9 kg)   SpO2 97%   BMI 24.55 kg/m   77 y.o.female presents for 3 month follow up.  has GERD; Hyperlipidemia; Abnormal glucose; Hypertension; Vitamin D deficiency; Medication management; Poor compliance with medical recommendations; and Fatty liver on their problem list.   She has decided to continue diet and exercise for control of BP, declines medication..  She did not start the medication. BP cuff brought today and calibrated with our cuff and was equivalent. Home logs show BP 129-140/70's. This AM BP was 169/79, does not like coming to the doctors BP Readings from Last 3 Encounters:  11/01/22 (!) 160/78  10/03/22 (!) 162/84  08/30/22 (!) 188/73   BMI is Body mass index is 24.55 kg/m., she has not been working on diet and exercise. Wt Readings from Last 3 Encounters:  11/01/22 140 lb 12.8 oz (63.9 kg)  10/03/22 142 lb 3.2 oz (64.5 kg)  08/30/22 146 lb (66.2 kg)   She is trying to start working on behavioral  intervention for control of anxiety.  [place]  Pt was encouraged to start Rosuvastatin 5 mg daily but patient declined. She continues to work on diet and exercise Lab  Results  Component Value Date   CHOL 231 (H) 06/26/2022   HDL 39 (L) 06/26/2022   LDLCALC 153 (H) 06/26/2022   TRIG 232 (H) 06/26/2022   CHOLHDL 5.9 (H) 06/26/2022    She had first elevated A1c last visit and has been doing diet and exercise.  Limiting carbs dramatically Lab Results  Component Value Date   HGBA1C 5.8 (H) 06/26/2022   She is drinking a lot more water.  Lab Results  Component Value Date   EGFR 76 08/10/2022   She is working on diet and exercise as well as  weight loss Lab Results  Component Value Date   ALT 35 (H) 08/10/2022   AST 48 (H) 08/10/2022   ALKPHOS 75 04/08/2019   BILITOT 1.0 08/10/2022     Past Medical History:  Diagnosis Date   Allergy    Essential tremor    GERD (gastroesophageal reflux disease)    Heart murmur    High cholesterol    Hyperlipidemia    Hypertension    labile   Labile hypertension    Prediabetes      Allergies  Allergen Reactions   Ampicillin Hives and Swelling   Aspirin Itching   Doxycycline Nausea And Vomiting   Macrodantin [Nitrofurantoin Macrocrystal] Hives, Itching and Swelling   Percocet [Oxycodone-Acetaminophen]    Propulsid [Cisapride] Itching and Swelling   Voltaren [Diclofenac] Hives and Itching   Codeine Palpitations   Penicillins Swelling and Rash    Current Outpatient Medications on File Prior to Visit  Medication Sig   Acetaminophen (TYLENOL PO) Take by mouth.   Ascorbic Acid (VITAMIN C) 500 MG CAPS Take by mouth.   betamethasone dipropionate (DIPROLENE) 0.05 % ointment Apply topically 2 (two) times daily.   Bevacizumab (AVASTIN IV) Inject into the vein. Every 12 weeks   Biotin 5000 MCG CAPS Take by mouth.   Cholecalciferol (D3 PO) Take 2,000 Units by mouth daily.   No current facility-administered medications on file prior to visit.    ROS: all negative except above.   Physical Exam:  BP (!) 160/78   Pulse 76   Temp 98.1 F (36.7 C)   Ht 5' 3.5" (1.613 m)   Wt 140 lb 12.8 oz (63.9 kg)   SpO2 97%   BMI 24.55 kg/m   General Appearance: Pleasant, slightly anxious female, in no apparent distress. Eyes: PERRLA, EOMs, conjunctiva no swelling or erythema Neck: Supple, thyroid normal.  Respiratory: Respiratory effort normal, BS equal bilaterally without rales, rhonchi, wheezing or stridor.  Cardio: RRR with no MRGs. Brisk peripheral pulses without edema.  Abdomen: Soft, + BS.  Non tender, no guarding, rebound, hernias, masses. Lymphatics: Non tender without  lymphadenopathy.  Musculoskeletal: Full ROM, 5/5 strength, normal gait.  Skin: Warm, dry without rashes, lesions, ecchymosis.  Neuro: Cranial nerves intact. Normal muscle tone, no cerebellar symptoms. Sensation intact.  Psych: Awake and oriented X 3, normal affect, Insight and Judgment appropriate.     Raynelle Dick, NP 12:07 PM [facility]

## 2022-11-01 ENCOUNTER — Encounter: Payer: Self-pay | Admitting: Nurse Practitioner

## 2022-11-01 ENCOUNTER — Ambulatory Visit: Payer: Medicare PPO | Admitting: Nurse Practitioner

## 2022-11-01 VITALS — BP 160/78 | HR 76 | Temp 98.1°F | Ht 63.5 in | Wt 140.8 lb

## 2022-11-01 DIAGNOSIS — E663 Overweight: Secondary | ICD-10-CM

## 2022-11-01 DIAGNOSIS — E559 Vitamin D deficiency, unspecified: Secondary | ICD-10-CM | POA: Diagnosis not present

## 2022-11-01 DIAGNOSIS — K760 Fatty (change of) liver, not elsewhere classified: Secondary | ICD-10-CM

## 2022-11-01 DIAGNOSIS — Z79899 Other long term (current) drug therapy: Secondary | ICD-10-CM

## 2022-11-01 DIAGNOSIS — R7309 Other abnormal glucose: Secondary | ICD-10-CM

## 2022-11-01 DIAGNOSIS — Z91148 Patient's other noncompliance with medication regimen for other reason: Secondary | ICD-10-CM | POA: Diagnosis not present

## 2022-11-01 DIAGNOSIS — E782 Mixed hyperlipidemia: Secondary | ICD-10-CM

## 2022-11-01 DIAGNOSIS — F419 Anxiety disorder, unspecified: Secondary | ICD-10-CM

## 2022-11-01 DIAGNOSIS — I10 Essential (primary) hypertension: Secondary | ICD-10-CM

## 2022-11-01 MED ORDER — BUSPIRONE HCL 5 MG PO TABS
ORAL_TABLET | ORAL | 1 refills | Status: DC
Start: 2022-11-01 — End: 2023-07-09

## 2022-11-01 NOTE — Patient Instructions (Signed)
Managing Anxiety, Adult After being diagnosed with anxiety, you may be relieved to know why you have felt or behaved a certain way. You may also feel overwhelmed about the treatment ahead and what it will mean for your life. With care and support, you can manage your anxiety. How to manage lifestyle changes Understanding the difference between stress and anxiety Although stress can play a role in anxiety, it is not the same as anxiety. Stress is your body's reaction to life changes and events, both good and bad. Stress is often caused by something external, such as a deadline, test, or competition. It normally goes away after the event has ended and will last just a few hours. But, stress can be ongoing and can lead to more than just stress. Anxiety is caused by something internal, such as imagining a terrible outcome or worrying that something will go wrong that will greatly upset you. Anxiety often does not go away even after the event is over, and it can become a long-term (chronic) worry. Lowering stress and anxiety Talk with your health care provider or a counselor to learn more about lowering anxiety and stress. They may suggest tension-reduction techniques, such as: Music. Spend time creating or listening to music that you enjoy and that inspires you. Mindfulness-based meditation. Practice being aware of your normal breaths while not trying to control your breathing. It can be done while sitting or walking. Centering prayer. Focus on a word, phrase, or sacred image that means something to you and brings you peace. Deep breathing. Expand your stomach and inhale slowly through your nose. Hold your breath for 3-5 seconds. Then breathe out slowly, letting your stomach muscles relax. Self-talk. Learn to notice and spot thought patterns that lead to anxiety reactions. Change those patterns to thoughts that feel peaceful. Muscle relaxation. Take time to tense muscles and then relax them. Choose a  tension-reduction technique that fits your lifestyle and personality. These techniques take time and practice. Set aside 5-15 minutes a day to do them. Specialized therapists can offer counseling and training in these techniques. The training to help with anxiety may be covered by some insurance plans. Other things you can do to manage stress and anxiety include: Keeping a stress diary. This can help you learn what triggers your reaction and then learn ways to manage your response. Thinking about how you react to certain situations. You may not be able to control everything, but you can control your response. Making time for activities that help you relax and not feeling guilty about spending your time in this way. Doing visual imagery. This involves imagining or creating mental pictures to help you relax. Practicing yoga. Through yoga poses, you can lower tension and relax.  Medicines Medicines for anxiety include: Antidepressant medicines. These are usually prescribed for long-term daily control. Anti-anxiety medicines. These may be added in severe cases, especially when panic attacks occur. When used together, medicines, psychotherapy, and tension-reduction techniques may be the most effective treatment. Relationships Relationships can play a big part in helping you recover. Spend more time connecting with trusted friends and family members. Think about going to couples counseling if you have a partner, taking family education classes, or going to family therapy. Therapy can help you and others better understand your anxiety. How to recognize changes in your anxiety Everyone responds differently to treatment for anxiety. Recovery from anxiety happens when symptoms lessen and stop interfering with your daily life at home or work. This may mean that you   will start to: Have better concentration and focus. Worry will interfere less in your daily thinking. Sleep better. Be less irritable. Have more  energy. Have improved memory. Try to recognize when your condition is getting worse. Contact your provider if your symptoms interfere with home or work and you feel like your condition is not improving. Follow these instructions at home: Activity Exercise. Adults should: Exercise for at least 150 minutes each week. The exercise should increase your heart rate and make you sweat (moderate-intensity exercise). Do strengthening exercises at least twice a week. Get the right amount and quality of sleep. Most adults need 7-9 hours of sleep each night. Lifestyle  Eat a healthy diet that includes plenty of vegetables, fruits, whole grains, low-fat dairy products, and lean protein. Do not eat a lot of foods that are high in fats, added sugars, or salt (sodium). Make choices that simplify your life. Do not use any products that contain nicotine or tobacco. These products include cigarettes, chewing tobacco, and vaping devices, such as e-cigarettes. If you need help quitting, ask your provider. Avoid caffeine, alcohol, and certain over-the-counter cold medicines. These may make you feel worse. Ask your pharmacist which medicines to avoid. General instructions Take over-the-counter and prescription medicines only as told by your provider. Keep all follow-up visits. This is to make sure you are managing your anxiety well or if you need more support. Where to find support You can get help and support from: Self-help groups. Online and community organizations. A trusted spiritual leader. Couples counseling. Family education classes. Family therapy. Where to find more information You may find that joining a support group helps you deal with your anxiety. The following sources can help you find counselors or support groups near you: Mental Health America: mentalhealthamerica.net Anxiety and Depression Association of America (ADAA): adaa.org National Alliance on Mental Illness (NAMI): nami.org Contact  a health care provider if: You have a hard time staying focused or finishing tasks. You spend many hours a day feeling worried about everyday life. You are very tired because you cannot stop worrying. You start to have headaches or often feel tense. You have chronic nausea or diarrhea. Get help right away if: Your heart feels like it is racing. You have shortness of breath. You have thoughts of hurting yourself or others. Get help right away if you feel like you may hurt yourself or others, or have thoughts about taking your own life. Go to your nearest emergency room or: Call 911. Call the National Suicide Prevention Lifeline at 1-800-273-8255 or 988. This is open 24 hours a day. Text the Crisis Text Line at 741741. This information is not intended to replace advice given to you by your health care provider. Make sure you discuss any questions you have with your health care provider. Document Revised: 11/28/2021 Document Reviewed: 06/12/2020 Elsevier Patient Education  2024 Elsevier Inc.  

## 2022-11-02 ENCOUNTER — Encounter: Payer: Self-pay | Admitting: Nurse Practitioner

## 2022-11-02 LAB — CBC WITH DIFFERENTIAL/PLATELET
Absolute Monocytes: 816 {cells}/uL (ref 200–950)
Basophils Absolute: 71 {cells}/uL (ref 0–200)
Basophils Relative: 0.7 %
Eosinophils Absolute: 173 {cells}/uL (ref 15–500)
Eosinophils Relative: 1.7 %
HCT: 39.3 % (ref 35.0–45.0)
Hemoglobin: 13.5 g/dL (ref 11.7–15.5)
Lymphs Abs: 2356 {cells}/uL (ref 850–3900)
MCH: 31 pg (ref 27.0–33.0)
MCHC: 34.4 g/dL (ref 32.0–36.0)
MCV: 90.1 fL (ref 80.0–100.0)
MPV: 10.8 fL (ref 7.5–12.5)
Monocytes Relative: 8 %
Neutro Abs: 6783 {cells}/uL (ref 1500–7800)
Neutrophils Relative %: 66.5 %
Platelets: 239 10*3/uL (ref 140–400)
RBC: 4.36 10*6/uL (ref 3.80–5.10)
RDW: 12.6 % (ref 11.0–15.0)
Total Lymphocyte: 23.1 %
WBC: 10.2 10*3/uL (ref 3.8–10.8)

## 2022-11-02 LAB — COMPLETE METABOLIC PANEL WITHOUT GFR
AG Ratio: 1.6 (calc) (ref 1.0–2.5)
ALT: 19 U/L (ref 6–29)
AST: 27 U/L (ref 10–35)
Albumin: 4.9 g/dL (ref 3.6–5.1)
Alkaline phosphatase (APISO): 85 U/L (ref 37–153)
BUN: 15 mg/dL (ref 7–25)
CO2: 25 mmol/L (ref 20–32)
Calcium: 9.7 mg/dL (ref 8.6–10.4)
Chloride: 103 mmol/L (ref 98–110)
Creat: 0.73 mg/dL (ref 0.60–1.00)
Globulin: 3.1 g/dL (ref 1.9–3.7)
Glucose, Bld: 91 mg/dL (ref 65–99)
Potassium: 4.1 mmol/L (ref 3.5–5.3)
Sodium: 139 mmol/L (ref 135–146)
Total Bilirubin: 0.7 mg/dL (ref 0.2–1.2)
Total Protein: 8 g/dL (ref 6.1–8.1)
eGFR: 85 mL/min/{1.73_m2}

## 2022-11-02 LAB — INSULIN, RANDOM: Insulin: 12 u[IU]/mL

## 2022-11-02 LAB — LIPID PANEL
Cholesterol: 232 mg/dL — ABNORMAL HIGH (ref <200)
HDL: 36 mg/dL — ABNORMAL LOW (ref >=50)
LDL Cholesterol (Calc): 159 mg/dL — ABNORMAL HIGH
Non-HDL Cholesterol (Calc): 196 mg/dL — ABNORMAL HIGH (ref <130)
Total CHOL/HDL Ratio: 6.4 (calc) — ABNORMAL HIGH (ref <5.0)
Triglycerides: 208 mg/dL — ABNORMAL HIGH (ref <150)

## 2022-11-02 LAB — MAGNESIUM: Magnesium: 2.3 mg/dL (ref 1.5–2.5)

## 2022-12-12 ENCOUNTER — Encounter (INDEPENDENT_AMBULATORY_CARE_PROVIDER_SITE_OTHER): Payer: Medicare PPO | Admitting: Ophthalmology

## 2022-12-12 DIAGNOSIS — I10 Essential (primary) hypertension: Secondary | ICD-10-CM | POA: Diagnosis not present

## 2022-12-12 DIAGNOSIS — H318 Other specified disorders of choroid: Secondary | ICD-10-CM | POA: Diagnosis not present

## 2022-12-12 DIAGNOSIS — H35033 Hypertensive retinopathy, bilateral: Secondary | ICD-10-CM

## 2022-12-12 DIAGNOSIS — H43813 Vitreous degeneration, bilateral: Secondary | ICD-10-CM | POA: Diagnosis not present

## 2023-02-06 ENCOUNTER — Ambulatory Visit: Payer: Medicare PPO | Admitting: Internal Medicine

## 2023-02-12 DIAGNOSIS — H10413 Chronic giant papillary conjunctivitis, bilateral: Secondary | ICD-10-CM | POA: Diagnosis not present

## 2023-03-05 ENCOUNTER — Ambulatory Visit: Payer: Medicare PPO | Admitting: Internal Medicine

## 2023-04-02 ENCOUNTER — Encounter: Payer: Medicare PPO | Admitting: Nurse Practitioner

## 2023-04-03 ENCOUNTER — Encounter (INDEPENDENT_AMBULATORY_CARE_PROVIDER_SITE_OTHER): Payer: Medicare PPO | Admitting: Ophthalmology

## 2023-04-03 DIAGNOSIS — H353112 Nonexudative age-related macular degeneration, right eye, intermediate dry stage: Secondary | ICD-10-CM | POA: Diagnosis not present

## 2023-04-03 DIAGNOSIS — H318 Other specified disorders of choroid: Secondary | ICD-10-CM

## 2023-04-03 DIAGNOSIS — H2513 Age-related nuclear cataract, bilateral: Secondary | ICD-10-CM

## 2023-04-03 DIAGNOSIS — H35033 Hypertensive retinopathy, bilateral: Secondary | ICD-10-CM | POA: Diagnosis not present

## 2023-04-03 DIAGNOSIS — I10 Essential (primary) hypertension: Secondary | ICD-10-CM | POA: Diagnosis not present

## 2023-04-03 DIAGNOSIS — H43813 Vitreous degeneration, bilateral: Secondary | ICD-10-CM | POA: Diagnosis not present

## 2023-04-10 ENCOUNTER — Ambulatory Visit: Payer: Medicare PPO | Admitting: Internal Medicine

## 2023-05-07 ENCOUNTER — Ambulatory Visit: Admitting: Family

## 2023-05-07 ENCOUNTER — Encounter: Payer: Self-pay | Admitting: Family

## 2023-05-07 VITALS — BP 138/78 | HR 90 | Temp 98.8°F | Ht 63.0 in | Wt 144.4 lb

## 2023-05-07 DIAGNOSIS — H6991 Unspecified Eustachian tube disorder, right ear: Secondary | ICD-10-CM | POA: Diagnosis not present

## 2023-05-07 MED ORDER — PREDNISONE 20 MG PO TABS: 40.0000 mg | ORAL_TABLET | Freq: Every day | ORAL | 0 refills | Status: DC

## 2023-05-08 NOTE — Progress Notes (Signed)
 Acute Office Visit  Subjective:     Patient ID: Margaret Sullivan, female    DOB: 1945-11-06, 78 y.o.   MRN: [id number]  Chief Complaint  Patient presents with  . Ear Pain    HPI Patient is in today with c/o nasal congestion, right ear feeling muffled, and running nose x 3 days. She recently completed a Zpak and Prednisone for Sinusitis and a Eustachian tube dysfunction. Denies any fever or fatigue.   Review of Systems  Constitutional: Negative.  Negative for chills and fever.  HENT:  Positive for congestion and hearing loss. Negative for ear pain and sore throat.        Right ear muffled  Respiratory: Negative.    Cardiovascular: Negative.   Musculoskeletal: Negative.   Neurological: Negative.   Psychiatric/Behavioral: Negative.     Past Medical History:  Diagnosis Date  . Allergy   . Essential tremor   . GERD (gastroesophageal reflux disease)   . Heart murmur   . High cholesterol   . Hyperlipidemia   . Hypertension    labile  . Labile hypertension   . Prediabetes     Social History   Socioeconomic History  . Marital status: Divorced    Spouse name: Not on file  . Number of children: 0  . Years of education: Not on file  . Highest education level: Master's degree (e.g., MA, MS, MEng, MEd, MSW, MBA)  Occupational History    Comment: retired Runner, broadcasting/film/video  Tobacco Use  . Smoking status: Never  . Smokeless tobacco: Never  Vaping Use  . Vaping status: Never Used  Substance and Sexual Activity  . Alcohol use: Never  . Drug use: Never  . Sexual activity: Not on file  Other Topics Concern  . Not on file  Social History Narrative   Lives with brother   Caffeine- 8-10 oz daily   Social Drivers of Corporate investment banker Strain: Not on file  Food Insecurity: Not on file  Transportation Needs: Not on file  Physical Activity: Not on file  Stress: Not on file  Social Connections: Not on file  Intimate Partner Violence: Not on file    Past Surgical History:   Procedure Laterality Date  . BACK SURGERY  2001  . COLONOSCOPY    . TONSILLECTOMY AND ADENOIDECTOMY Bilateral 1950  . UPPER GASTROINTESTINAL ENDOSCOPY    . WISDOM TOOTH EXTRACTION Bilateral 1969    Family History  Problem Relation Age of Onset  . Lymphoma Mother        NHL  . Heart attack Father   . Breast cancer Father   . Colon cancer Maternal Uncle   . Esophageal cancer Neg Hx   . Stomach cancer Neg Hx   . Rectal cancer Neg Hx     Allergies  Allergen Reactions  . Ampicillin Hives and Swelling  . Aspirin Itching  . Doxycycline Nausea And Vomiting  . Macrodantin [Nitrofurantoin Macrocrystal] Hives, Itching and Swelling  . Percocet [Oxycodone-Acetaminophen]   . Propulsid [Cisapride] Itching and Swelling  . Voltaren [Diclofenac] Hives and Itching  . Codeine Palpitations  . Penicillins Swelling and Rash    Current Outpatient Medications on File Prior to Visit  Medication Sig Dispense Refill  . Acetaminophen (TYLENOL PO) Take by mouth.    . Ascorbic Acid (VITAMIN C) 500 MG CAPS Take by mouth.    . betamethasone dipropionate (DIPROLENE) 0.05 % ointment Apply topically 2 (two) times daily.    . Bevacizumab (AVASTIN IV)  Inject into the vein. Every 12 weeks    . Biotin 5000 MCG CAPS Take by mouth.    . busPIRone (BUSPAR) 5 MG tablet 1 tab daily as needed for anxiety (Patient not taking: Reported on 05/07/2023) 30 tablet 1  . Cholecalciferol (D3 PO) Take 2,000 Units by mouth daily.     No current facility-administered medications on file prior to visit.    BP 138/78   Pulse 90   Temp 98.8 F (37.1 C) (Oral)   Ht 5\' 3"  (1.6 m)   Wt 144 lb 6.4 oz (65.5 kg)   SpO2 96%   BMI 25.58 kg/m chart     Objective:    BP 138/78   Pulse 90   Temp 98.8 F (37.1 C) (Oral)   Ht 5\' 3"  (1.6 m)   Wt 144 lb 6.4 oz (65.5 kg)   SpO2 96%   BMI 25.58 kg/m    Physical Exam  No results found for any visits on 05/07/23.      Assessment & Plan:   Problem List Items Addressed  This Visit   None Visit Diagnoses       Disorder of right eustachian tube    -  Primary   Relevant Orders   Ambulatory referral to ENT       Meds ordered this encounter  Medications  . predniSONE (DELTASONE) 20 MG tablet    Sig: Take 2 tablets (40 mg total) by mouth daily with breakfast.    Dispense:  14 tablet    Refill:  0   Referral placed to ENT given the persistent ETD. May cancel if symptoms resolve. Call with any questions or concerns. Establish with a PCP. Provider list given today.  No follow-ups on file.  Eulis Foster, FNP

## 2023-05-14 ENCOUNTER — Telehealth: Payer: Self-pay | Admitting: Internal Medicine

## 2023-05-14 NOTE — Telephone Encounter (Signed)
 Copied from CRM [id number]. Topic: Clinical - Medication Question >> May 13, 2023  3:59 PM Margaret Sullivan wrote: Reason for CRM: Patient would like to know if she would need to come for refills or needed to be seen again due to the situation not opening up (no hearing in right ear, drainage slowed and coughing improved). Please call [phone]

## 2023-05-15 ENCOUNTER — Telehealth: Payer: Self-pay | Admitting: Internal Medicine

## 2023-05-15 NOTE — Telephone Encounter (Signed)
 Copied from CRM [id number]. Topic: Clinical - Medical Advice >> May 15, 2023  2:07 PM Shelbie Proctor wrote: Reason for CRM: Patient [phone] the right ear is not open, but other symptoms have improvements, Does patient need to be seen or have more medications. The first ENT appointment is in May 2025. Please advise.    [facility] [place], [place] -  [address] [place] [address] Phone:[phone]:[phone]

## 2023-05-22 ENCOUNTER — Ambulatory Visit: Payer: Self-pay

## 2023-05-22 NOTE — Telephone Encounter (Signed)
  Chief Complaint: Cough Symptoms: slightly productive cough with some congestion Frequency: started last month Pertinent Negatives: Patient denies fever Disposition: [] ED /[] Urgent Care (no appt availability in office) / [] Appointment(In office/virtual)/ []  [facility]/ [x] Home Care/ [] Refused Recommended Disposition /[] [facility]/ []  Follow-up with PCP Additional Notes: patient with complicated history of URI and hearing loss to right ear in Feb 2025. Patient had been a patient of Dr. Oneta Rack and needs to establish with a new provider. Patient states her symptoms have improved greatly. Still has a cough with slightly productive cough. Home care instructions given to patient with strict call back information. Patient was set up for a new patient appointment for Jul 09, 2023 with [facility]. Patient verbalizes understanding of plan and all question answered.    Copied from CRM [id number]. Topic: Clinical - Red Word Triage >> May 22, 2023  4:08 PM Marlow Baars wrote: Red Word that prompted transfer to Nurse Triage: The patient called in stating she was seen recently and diagnosed with acute sinusitis and was put on an antibiotic and 2 rounds of prednisone. She states she has lost her hearing in the right ear and says she has a bad cough, congestion and it has gone into her chest. I will transfer her to E2C2 NT Reason for Disposition  Cough  Answer Assessment - Initial Assessment Questions 1. ONSET: "When did the cough begin?"      Cough started last month 2. SEVERITY: "How bad is the cough today?"      Not bad according to patient 3. SPUTUM: "Describe the color of your sputum" (none, dry cough; clear, white, yellow, green)     Clear  4. HEMOPTYSIS: "Are you coughing up any blood?" If so ask: "How much?" (flecks, streaks, tablespoons, etc.)     no 5. DIFFICULTY BREATHING: "Are you having difficulty breathing?" If Yes, ask: "How bad is it?" (e.g., mild, moderate, severe)     - MILD: No SOB at rest, mild SOB with walking, speaks normally in sentences, can lie down, no retractions, pulse < 100.    - MODERATE: SOB at rest, SOB with minimal exertion and prefers to sit, cannot lie down flat, speaks in phrases, mild retractions, audible wheezing, pulse 100-120.    - SEVERE: Very SOB at rest, speaks in single words, struggling to breathe, sitting hunched forward, retractions, pulse > 120      No 6. FEVER: "Do you have a fever?" If Yes, ask: "What is your temperature, how was it measured, and when did it start?"     No 7. CARDIAC HISTORY: "Do you have any history of heart disease?" (e.g., heart attack, congestive heart failure)      no 8. LUNG HISTORY: "Do you have any history of lung disease?"  (e.g., pulmonary embolus, asthma, emphysema)     no 9. PE RISK FACTORS: "Do you have a history of blood clots?" (or: recent major surgery, recent prolonged travel, bedridden)     no 10. OTHER SYMPTOMS: "Do you have any other symptoms?" (e.g., runny nose, wheezing, chest pain)       no 12. TRAVEL: "Have you traveled out of the country in the last month?" (e.g., travel history, exposures)       no  Protocols used: Cough - Acute Productive-A-AH

## 2023-06-26 ENCOUNTER — Encounter: Payer: Medicare PPO | Admitting: Nurse Practitioner

## 2023-07-09 ENCOUNTER — Ambulatory Visit (INDEPENDENT_AMBULATORY_CARE_PROVIDER_SITE_OTHER): Admitting: Audiology

## 2023-07-09 ENCOUNTER — Ambulatory Visit: Admitting: Family

## 2023-07-09 ENCOUNTER — Encounter: Payer: Self-pay | Admitting: Family

## 2023-07-09 ENCOUNTER — Encounter (INDEPENDENT_AMBULATORY_CARE_PROVIDER_SITE_OTHER): Payer: Self-pay

## 2023-07-09 ENCOUNTER — Ambulatory Visit (INDEPENDENT_AMBULATORY_CARE_PROVIDER_SITE_OTHER): Admitting: Otolaryngology

## 2023-07-09 VITALS — BP 152/73 | HR 87 | Temp 98.4°F | Ht 63.0 in | Wt 149.1 lb

## 2023-07-09 VITALS — BP 175/87 | HR 80 | Ht 64.0 in | Wt 149.0 lb

## 2023-07-09 DIAGNOSIS — Z780 Asymptomatic menopausal state: Secondary | ICD-10-CM

## 2023-07-09 DIAGNOSIS — H903 Sensorineural hearing loss, bilateral: Secondary | ICD-10-CM

## 2023-07-09 DIAGNOSIS — E782 Mixed hyperlipidemia: Secondary | ICD-10-CM | POA: Diagnosis not present

## 2023-07-09 DIAGNOSIS — R0989 Other specified symptoms and signs involving the circulatory and respiratory systems: Secondary | ICD-10-CM

## 2023-07-09 DIAGNOSIS — Z1211 Encounter for screening for malignant neoplasm of colon: Secondary | ICD-10-CM | POA: Diagnosis not present

## 2023-07-09 DIAGNOSIS — Z13820 Encounter for screening for osteoporosis: Secondary | ICD-10-CM | POA: Diagnosis not present

## 2023-07-09 DIAGNOSIS — K760 Fatty (change of) liver, not elsewhere classified: Secondary | ICD-10-CM

## 2023-07-09 DIAGNOSIS — H6991 Unspecified Eustachian tube disorder, right ear: Secondary | ICD-10-CM | POA: Diagnosis not present

## 2023-07-09 DIAGNOSIS — H9121 Sudden idiopathic hearing loss, right ear: Secondary | ICD-10-CM | POA: Diagnosis not present

## 2023-07-09 DIAGNOSIS — H3562 Retinal hemorrhage, left eye: Secondary | ICD-10-CM | POA: Insufficient documentation

## 2023-07-09 MED ORDER — FLUTICASONE PROPIONATE 50 MCG/ACT NA SUSP: 2.0000 | Freq: Every day | NASAL | 6 refills | Status: DC

## 2023-07-09 MED ORDER — AZELASTINE HCL 0.1 % NA SOLN: 2.0000 | Freq: Two times a day (BID) | NASAL | 12 refills | Status: DC

## 2023-07-09 MED ORDER — ALPRAZOLAM 1 MG PO TABS: 1.0000 mg | ORAL_TABLET | Freq: Once | ORAL | 0 refills | Status: AC

## 2023-07-09 NOTE — Assessment & Plan Note (Signed)
 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 Reports improvement and no discomfort. - Continue Avastin  injections every 12 weeks as per retinologist's recommendation.

## 2023-07-09 NOTE — Progress Notes (Signed)
 Dear Dr. Arlis Lakes, Here is my assessment for our mutual patient, Margaret Sullivan. Thank you for allowing me the opportunity to care for your patient. Please do not hesitate to contact me should you have any other questions. Sincerely, Dr. Milon Aloe  Otolaryngology Clinic Note Referring provider: Dr. Arlis Lakes HPI:  Margaret Sullivan is a 78 y.o. female kindly referred by Dr. Arlis Lakes for evaluation of right ear trouble.  Initial visit (07/2023): Patient reports: start in late Feb 2025, and she reports that she suddenly had right sided sudden hearing loss. Has not returned since. No antecedent event or URI or trauma prior to this. She reports that since then, she had an unrelated episode a few days later of nasal congestion, right ear muffled hearing, and URI symptoms. She denied any ear pain or discomfort at that time. She currently reports some right > left ear "congestion" or fullness, which has been ongoing since then. She has gotten antibiotics, and two rounds of steroids which helped the ear pressure some but hearing has not changed. No loud noise exposure. No FHX of Hearing loss.  Not using any nasal sprays. No nasal congestion, PND or other nasal symptoms currently. She does have AR symptoms seasonally  Patient denies: ear pain, vertigo, drainage, tinnitus Patient additionally denies: deep pain in ear canal, eustachian tube symptoms such as popping/crackling Patient also denies barotrauma, vestibular suppressant use, ototoxic medication use Prior ear surgery: no No history of frequent ear infections (except as a child)  H&N Surgery: no Personal or FHx of bleeding dz or anesthesia difficulty: no   GLP-1: no AP/AC: no  Tobacco: no  PMHx: HTN, GERD, Fatty Liver, HLD  Independent Review of Additional Tests or Records:  Margaret Sullivan (05/07/2023): noted nasal congesiton, right ear feeling muffled, running cough x3 days; recent Z-pak and pred; Dx: ETD; Rx: Pred 40 x7d CBC and CMP 11/01/2022: WBC 10.2, Hgb  13.5; Bun/Cr 15/0.73 07/2023 Audiogram was independently reviewed and interpreted by me and it reveals - borderline downsloping to mod severe SNHL on left; on right, borderline, then moderate severe SNHL mid-freq and then downsloping again at 2000 Hz; B/B tymps; WRT 84% and 88% AD and AS at 80dB and 70dB respectively  SNHL= Sensorineural hearing loss   PMH/Meds/All/SocHx/FamHx/ROS:   Past Medical History:  Diagnosis Date   Abnormal glucose    Allergy    Essential tremor    GERD (gastroesophageal reflux disease)    Heart murmur    High cholesterol    Hyperlipidemia    Hypertension    labile   Labile hypertension    Medication management 06/10/2013   Prediabetes      Past Surgical History:  Procedure Laterality Date   BACK SURGERY  2001   COLONOSCOPY     TONSILLECTOMY AND ADENOIDECTOMY Bilateral 1950   UPPER GASTROINTESTINAL ENDOSCOPY     WISDOM TOOTH EXTRACTION Bilateral 1969    Family History  Problem Relation Age of Onset   Lymphoma Mother        NHL   Heart attack Father    Breast cancer Father    Colon cancer Maternal Uncle    Esophageal cancer Neg Hx    Stomach cancer Neg Hx    Rectal cancer Neg Hx      Social Connections: Moderately Integrated (07/12/2023)   Social Connection and Isolation Panel [NHANES]    Frequency of Communication with Friends and Family: More than three times a week    Frequency of Social Gatherings with Friends and Family:  More than three times a week    Attends Religious Services: More than 4 times per year    Active Member of Clubs or Organizations: Yes    Attends Banker Meetings: More than 4 times per year    Marital Status: Divorced      Current Outpatient Medications:    Ascorbic Acid (VITAMIN C) 500 MG CAPS, Take 500 mg by mouth daily., Disp: , Rfl:    betamethasone dipropionate (DIPROLENE) 0.05 % ointment, Apply 1 Application topically as needed (outbreaks)., Disp: , Rfl:    Bevacizumab  (AVASTIN  IV), Inject into  the vein. Every 12 weeks eye injection - left eye, Disp: , Rfl:    Cholecalciferol (D3 PO), Take 1 capsule by mouth daily., Disp: , Rfl:    clopidogrel (PLAVIX) 75 MG tablet, Take 1 tablet (75 mg total) by mouth daily., Disp: 30 tablet, Rfl: 2   PRESCRIPTION MEDICATION, Place 4 drops into the left eye daily. Unknown eye drop after - eye injection only for 2 days until next injection, Disp: , Rfl:    Physical Exam:   BP (!) 175/87 (BP Location: Left Arm, Patient Position: Sitting, Cuff Size: Normal)   Pulse 80   Ht 5\' 4"  (1.626 m)   Wt 149 lb (67.6 kg)   SpO2 96%   BMI 25.58 kg/m   Salient findings:  CN II-XII intact Given history and complaints, ear microscopy was indicated and performed for evaluation with findings as below in physical exam section and in procedures;  Bilateral EAC clear and TM intact with well pneumatized middle ear spaces, modest global retraction Weber 512: right Rinne 512: AC > BC b/l  Anterior rhinoscopy: Septum intact; bilateral inferior turbinates without significant hypertrophy No lesions of oral cavity/oropharynx No obviously palpable neck masses/lymphadenopathy/thyromegaly No respiratory distress or stridor  Seprately Identifiable Procedures:  Prior to initiating any procedures, risks/benefits/alternatives were explained to the patient and verbal consent obtained. Procedure: Bilateral ear microscopy using microscope (CPT [phone]) Pre-procedure diagnosis: sudden right hearing loss, eustachian tube dysfunction Post-procedure diagnosis: same Indication: see above; given patient's otologic complaints and history, for improved and comprehensive examination of external ear and tympanic membrane, bilateral otologic examination using microscope was performed  Procedure: Patient was placed semi-recumbent. Both ear canals were examined using the microscope with findings above Patient tolerated the procedure well.   Impression & Plans:  Margaret Sullivan is a 78 y.o.  female with:  1. Sudden right hearing loss   2. Dysfunction of right eustachian tube    Appears to be two different problems chronologically. First sudden onset HL and then had URI sx and sx that sound like ETD. She has been on pred and antibiotics without significant benefit. B/B tymps but no effusion noted or conductive hearing component which is interesting since she does not have any left symptoms or issues at all. We discussed options: management of ETD including with BTT v/s medical management and management of what appears to be sudden SNHL I offered PO and IT steroids but patient declined which is reasonable given it has been over 3 months since the event. We also discussed need for further eval for hearing asymmetry - will get MRI IAC; she's claustrophobic so will Rx xanax  to take prior  - Start flonase  and astelin  BID for ETD; autoinsufflate ears - f/u in 2 months with audio, sooner if any concerns  See below regarding exact medications prescribed this encounter including dosages and route: Xanax  PO 1mg  for 1 dose prior to MRI;  astelin  and flonase  nasal sprays BID  Thank you for allowing me the opportunity to care for your patient. Please do not hesitate to contact me should you have any other questions.  Sincerely, Milon Aloe, MD Otolaryngologist (ENT), [facility] Phone: [phone] Fax: [phone]  07/14/2023, 11:28 AM   MDM:  Level 4 Complexity/Problems addressed: mod - multiple chronic issues Data complexity: mod - independent review of notes, labs, ordering tests - Morbidity: mod  - Prescription Drug prescribed or managed: yes

## 2023-07-09 NOTE — Assessment & Plan Note (Signed)
 Hypertension with previous issues related to antihypertensive medication causing hypokalemia. Discussed current blood pressure management and emphasized maintaining blood pressure within recommended guidelines to prevent organ damage. - Discussed blood pressure management and the importance of maintaining it within recommended guidelines. - Drink at least 2L water daily - Eat low sodium diet, increase cardio exercise as able - F/U in 3 mos with CPE or prn

## 2023-07-09 NOTE — Assessment & Plan Note (Signed)
 Fatty liver with a need to follow up with gastroenterologist to assess current status and management. Last liver enzymes wnl, pt weight very good. - Set up an appointment with gastroenterologist to discuss fatty liver.

## 2023-07-09 NOTE — Patient Instructions (Addendum)
 Welcome to [facility], It was a pleasure meeting you today!    As discussed, you can try generic Nasacort over the counter to help all of your allergy/sinus symptoms in addition to saline nasal rinses.   [facility] and they will call you to schedule.  Please schedule a physical with fasting labs in August.    PLEASE NOTE: If you had any LAB tests please let us  know if you have not heard back within a few days. You may see your results on MyChart before we have a chance to review them but we will give you a call once they are reviewed by us . If we ordered any REFERRALS today, please let us  know if you have not heard from their office within the next week.  Let us  know through MyChart if you are needing REFILLS, or have your pharmacy send us  the request. You can also use MyChart to communicate with me or any office staff.  Please try these tips to maintain a healthy lifestyle: It is important that you exercise regularly at least 30 minutes 5 times a week. Think about what you will eat, plan ahead. Choose whole foods, & think  "clean, green, fresh or frozen" over canned, processed or packaged foods which are more sugary, salty, and fatty. 70 to 75% of food eaten should be fresh vegetables and protein. 2-3  meals daily with healthy snacks between meals, but must be whole fruit, protein or vegetables. Aim to eat over a 10 hour period when you are active, for example, 7am to 5pm, and then STOP after your last meal of the day, drinking only water.  Shorter eating windows, 6-8 hours, are showing benefits in heart disease and blood sugar regulation. Drink water every day! Shoot for 64 ounces daily = 8 cups, no other drink is as healthy! Fruit juice is best enjoyed in a healthy way, by EATING the fruit.

## 2023-07-09 NOTE — Progress Notes (Signed)
 New Patient Office Visit  Subjective:  Patient ID: Margaret Sullivan, female    DOB: 1946/02/04  Age: 78 y.o. MRN: [id number]  CC:  Chief Complaint  Patient presents with   New Patient (Initial Visit)   Retinal hemorrhaging   HPI Margaret Sullivan presents for establishing care today.  Discussed the use of AI scribe software for clinical note transcription with the patient, who gave verbal consent to proceed.  History of Present Illness Margaret Sullivan is a 78 year old female who presents with persistent ear blockage and sinus issues.  She has experienced persistent blockage in her right ear for ten weeks, initially losing complete hearing during a funeral, which she associated with a vacuum effect from shutting a car door. Despite some improvement, the ear remains blocked. She was diagnosed with acute sinusitis and eustachian tube dysfunction at urgent care. Treatment with a Z-Pak and prednisone  improved her overall condition but did not resolve the ear blockage. A subsequent course of prednisone  was also ineffective.  She has sinus issues, particularly on the left side, related to allergies and previous lymphoma. She experiences sinus-related headaches and toothaches, with her dentist noting tissue changes around her sinuses but no abscess. She has not produced nasal mucus during this period. Her medical history includes high cholesterol, intermittent hypertension, and a fatty liver. She takes vitamin D  and receives Avastin  injections for retinal hemorrhaging in her left eye, now reduced to every twelve weeks. She manages GERD through diet and exercise but notes recent weight gain due to comfort eating during her illness. She has experienced multiple family losses recently, contributing to her stress levels. She has a history of pneumonia and bronchitis and is concerned about her current symptoms progressing to these conditions. She avoids over-the-counter nasal sprays due to past adverse  reactions and is cautious with medications due to her fatty liver. She monitors her blood pressure at home and maintains records of her medical history.  Assessment & Plan Wellness Visit - Routine wellness visit for a 78 year old female. No acute concerns identified. - Recheck blood pressure during the visit. - Provide a summary of today's visit. - Schedule follow-up for fasting blood work and physical in August.  Acute sinusitis with Eustachian tube dysfunction Recent acute sinusitis with Eustachian tube dysfunction, treated with Z-Pak and prednisone . Symptoms improved but ear remains blocked. Discussed potential use of Nasacort for ongoing sinus and allergy symptoms. Plans to follow up with ENT for further evaluation. - Recommend Nasacort nasal spray, twice daily for a few days, then reduce to once daily or every other day. - Follow up with ENT, Dr. Lydia Sams, for further evaluation of sinus and ear symptoms.  Hypertension - Hypertension with previous issues related to antihypertensive medication causing hypokalemia. Discussed current blood pressure management and emphasized maintaining blood pressure within recommended guidelines to prevent organ damage. - Discussed blood pressure management and the importance of maintaining it within recommended guidelines. - Drink at least 2L water daily - Eat low sodium diet, increase cardio exercise as able - F/U in 3 mos with CPE or prn  Fatty liver Fatty liver with a need to follow up with gastroenterologist to assess current status and management. Last liver enzymes wnl, pt weight very good. - Set up an appointment with gastroenterologist to discuss fatty liver.  GERD GERD with discussion on dietary management and the impact of recent stress and dietary changes on symptoms.  Retinal hemorrhage Retinal hemorrhage in the left eye, managed with Avastin  injections  every 12 weeks by retinologist. Reports improvement and no discomfort. - Continue  Avastin  injections every 12 weeks as per retinologist's recommendation.   Subjective:    Outpatient Medications Prior to Visit  Medication Sig Dispense Refill   Acetaminophen  (TYLENOL  PO) Take by mouth.     Ascorbic Acid (VITAMIN C) 500 MG CAPS Take by mouth.     betamethasone dipropionate (DIPROLENE) 0.05 % ointment Apply topically 2 (two) times daily.     Bevacizumab  (AVASTIN  IV) Inject into the vein. Every 12 weeks     Biotin 5000 MCG CAPS Take by mouth. (Patient not taking: Reported on 07/09/2023)     Cholecalciferol (D3 PO) Take 2,000 Units by mouth daily. (Patient not taking: Reported on 07/09/2023)     busPIRone  (BUSPAR ) 5 MG tablet 1 tab daily as needed for anxiety (Patient not taking: Reported on 07/09/2023) 30 tablet 1   predniSONE  (DELTASONE ) 20 MG tablet Take 2 tablets (40 mg total) by mouth daily with breakfast. (Patient not taking: Reported on 07/09/2023) 14 tablet 0   No facility-administered medications prior to visit.   Past Medical History:  Diagnosis Date   Abnormal glucose    Allergy    Essential tremor    GERD (gastroesophageal reflux disease)    Heart murmur    High cholesterol    Hyperlipidemia    Hypertension    labile   Labile hypertension    Medication management 06/10/2013   Prediabetes    Past Surgical History:  Procedure Laterality Date   BACK SURGERY  2001   COLONOSCOPY     TONSILLECTOMY AND ADENOIDECTOMY Bilateral 1950   UPPER GASTROINTESTINAL ENDOSCOPY     WISDOM TOOTH EXTRACTION Bilateral 1969    Objective:   Today's Vitals: BP (!) 152/73   Pulse 87   Temp 98.4 F (36.9 C) (Temporal)   Ht 5\' 3"  (1.6 m)   Wt 149 lb 2 oz (67.6 kg)   SpO2 96%   BMI 26.42 kg/m   Physical Exam Vitals and nursing note reviewed.  Constitutional:      Appearance: Normal appearance.  Cardiovascular:     Rate and Rhythm: Normal rate and regular rhythm.  Pulmonary:     Effort: Pulmonary effort is normal.     Breath sounds: Normal breath sounds.   Musculoskeletal:        General: Normal range of motion.  Skin:    General: Skin is warm and dry.  Neurological:     Mental Status: She is alert.  Psychiatric:        Mood and Affect: Mood normal.        Behavior: Behavior normal.     Versa Gore, NP

## 2023-07-09 NOTE — Patient Instructions (Addendum)
 Use flonase two sprays each nostril twice per day; right after, use astelin nasal spray two sprays each nostril twice per day; Pop your ears multiple times per day

## 2023-07-09 NOTE — Progress Notes (Signed)
 [address] [place], [place] [address] [phone]  Audiological Evaluation    Name: Margaret Sullivan     DOB:   1945-06-28      MRN:   [id number]                                                                                     Service Date: 07/09/2023     Accompanied by: unaccompanied    Patient comes today after Dr. Lydia Sams, ENT sent a referral for a hearing evaluation due to concerns with hearing loss.   Symptoms Yes Details  Hearing loss  [x]  Reports that right side hearing went away on April 27, 2023- says she was feeling well at the moment. Reports cannot use the phone in the right ear and sounds seem robotic/distorted.  Tinnitus  []    Ear pain/ infections/pressure  []    Balance problems  []    Noise exposure history  []    Previous ear surgeries  []    Family history of hearing loss  []    Amplification  []    Other  [x]  Was prescribed with antibiotics and prednisone . Says she was told to have a sinus infection and maybe a pneumonia.    Otoscopy: Right ear: Clear external ear canals and notable landmarks visualized on the tympanic membrane. Left ear:  Clear external ear canals and notable landmarks visualized on the tympanic membrane.  Tympanometry: Right ear: Type B- Normal external ear canal volume with no middle ear pressure peak or tympanic membrane compliance. Left ear: Type B- Normal external ear canal volume with no middle ear pressure peak or tympanic membrane compliance. Of note- this repeated several times.Cleaned the equipment and obtained same results. The left eardrum would at times have no pressure peak and at other times would have a .1cc -reduced mobility.   Pure tone Audiometry: Right ear- Normal to moderately severe sensorineural hearing loss from 250 Hz - 8000 Hz. Left ear-  Borderline normal from [phone] Hz, then mild to moderately severe sensorineural hearing loss from 4000-8000 Hz.  Sensorineural type of hearing loss in the low-mid  frequencies for the right ear was re-checked. There is a hearing asymmetry from 500-15000 Hz,worse in the right ear.  Speech Audiometry: Right ear- Speech Reception Threshold (SRT) was obtained at 45 dBHL, with contralateral masking. Left ear-Speech Reception Threshold (SRT) was obtained at 25 dBHL.   Word Recognition Score Tested using NU-6 (MLV) Right ear: 84% was obtained at a presentation level of 80 dBHL with contralateral masking which is deemed as  good . Left ear: 88% was obtained at a presentation level of 70 dBHL with contralateral masking which is deemed as  good .   The hearing test results were completed under headphones and results are deemed to be of good to fair reliability. Test technique:  conventional   Of note, weber with the bone oscillator slightly lateralized to left side or midline,per patient.  Rinne- air> bone in both ears, per patient.  Recommendations: Follow up with ENT as scheduled for today.  Return for a hearing evaluation if concerns with hearing changes arise or per MD recommendation. Consider a communication needs assessment  after medical clearance for hearing aids is obtained.   Recommend repeat audiogram to make sure hearing loss in sensorineural given the mismatch between patients reported symptoms, tympanograms results, and right asymmetric sensorineural hearing loss.   Rjay Revolorio Margaret Sullivan, AUD

## 2023-07-12 ENCOUNTER — Observation Stay (HOSPITAL_BASED_OUTPATIENT_CLINIC_OR_DEPARTMENT_OTHER)

## 2023-07-12 ENCOUNTER — Encounter (HOSPITAL_COMMUNITY): Payer: Self-pay | Admitting: Internal Medicine

## 2023-07-12 ENCOUNTER — Other Ambulatory Visit: Payer: Self-pay

## 2023-07-12 ENCOUNTER — Observation Stay (HOSPITAL_COMMUNITY)
Admission: EM | Admit: 2023-07-12 | Discharge: 2023-07-13 | Disposition: A | Discharge status: Still In Hospital | Attending: Internal Medicine | Admitting: Internal Medicine

## 2023-07-12 ENCOUNTER — Emergency Department (HOSPITAL_COMMUNITY)

## 2023-07-12 ENCOUNTER — Observation Stay (HOSPITAL_COMMUNITY)

## 2023-07-12 DIAGNOSIS — I10 Essential (primary) hypertension: Secondary | ICD-10-CM | POA: Insufficient documentation

## 2023-07-12 DIAGNOSIS — R41841 Cognitive communication deficit: Secondary | ICD-10-CM | POA: Diagnosis not present

## 2023-07-12 DIAGNOSIS — H53129 Transient visual loss, unspecified eye: Secondary | ICD-10-CM | POA: Diagnosis present

## 2023-07-12 DIAGNOSIS — E785 Hyperlipidemia, unspecified: Secondary | ICD-10-CM | POA: Insufficient documentation

## 2023-07-12 DIAGNOSIS — G43809 Other migraine, not intractable, without status migrainosus: Secondary | ICD-10-CM | POA: Diagnosis not present

## 2023-07-12 DIAGNOSIS — G459 Transient cerebral ischemic attack, unspecified: Principal | ICD-10-CM | POA: Diagnosis present

## 2023-07-12 DIAGNOSIS — Z79899 Other long term (current) drug therapy: Secondary | ICD-10-CM | POA: Diagnosis not present

## 2023-07-12 LAB — ETHANOL: Alcohol, Ethyl (B): <15 mg/dL (ref <15)

## 2023-07-12 LAB — COMPREHENSIVE METABOLIC PANEL WITH GFR
ALT: 23 U/L (ref 0–44)
AST: 32 U/L (ref 15–41)
Albumin: 4.4 g/dL (ref 3.5–5.0)
Alkaline Phosphatase: 70 U/L (ref 38–126)
Anion gap: 12 (ref 5–15)
BUN: 11 mg/dL (ref 8–23)
CO2: 23 mmol/L (ref 22–32)
Calcium: 9.3 mg/dL (ref 8.9–10.3)
Chloride: 104 mmol/L (ref 98–111)
Creatinine, Ser: 0.77 mg/dL (ref 0.44–1.00)
GFR, Estimated: >60 mL/min (ref >60)
Glucose, Bld: 102 mg/dL — ABNORMAL HIGH (ref 70–99)
Potassium: 3.7 mmol/L (ref 3.5–5.1)
Sodium: 139 mmol/L (ref 135–145)
Total Bilirubin: 1.2 mg/dL (ref 0.0–1.2)
Total Protein: 8.1 g/dL (ref 6.5–8.1)

## 2023-07-12 LAB — ECHOCARDIOGRAM COMPLETE
AR max vel: 1.79 cm2
AV Area VTI: 1.79 cm2
AV Area mean vel: 1.56 cm2
AV Mean grad: 3 mmHg
AV Peak grad: 5.5 mmHg
Ao pk vel: 1.17 m/s
Area-P 1/2: 4.08 cm2
Calc EF: 67.9 %
Height: 64 in
MV VTI: 2.73 cm2
S' Lateral: 2.6 cm
Single Plane A2C EF: 59.2 %
Single Plane A4C EF: 75.8 %
Weight: 2433.88 [oz_av]

## 2023-07-12 LAB — CBC
HCT: 39.8 % (ref 36.0–46.0)
Hemoglobin: 13.8 g/dL (ref 12.0–15.0)
MCH: 30.9 pg (ref 26.0–34.0)
MCHC: 34.7 g/dL (ref 30.0–36.0)
MCV: 89 fL (ref 80.0–100.0)
Platelets: 225 10*3/uL (ref 150–400)
RBC: 4.47 MIL/uL (ref 3.87–5.11)
RDW: 12.2 % (ref 11.5–15.5)
WBC: 10.3 10*3/uL (ref 4.0–10.5)
nRBC: 0 % (ref 0.0–0.2)

## 2023-07-12 LAB — PROTIME-INR
INR: 1.1 (ref 0.8–1.2)
Prothrombin Time: 14.7 s (ref 11.4–15.2)

## 2023-07-12 LAB — POCT I-STAT, CHEM 8
BUN: 13 mg/dL (ref 8–23)
Calcium, Ion: 1.04 mmol/L — ABNORMAL LOW (ref 1.15–1.40)
Chloride: 106 mmol/L (ref 98–111)
Creatinine, Ser: 0.9 mg/dL (ref 0.44–1.00)
Glucose, Bld: 98 mg/dL (ref 70–99)
HCT: 40 % (ref 36.0–46.0)
Hemoglobin: 13.6 g/dL (ref 12.0–15.0)
Potassium: 3.8 mmol/L (ref 3.5–5.1)
Sodium: 138 mmol/L (ref 135–145)
TCO2: 23 mmol/L (ref 22–32)

## 2023-07-12 LAB — DIFFERENTIAL
Abs Immature Granulocytes: 0.04 10*3/uL (ref 0.00–0.07)
Basophils Absolute: 0.1 10*3/uL (ref 0.0–0.1)
Basophils Relative: 1 %
Eosinophils Absolute: 0.2 10*3/uL (ref 0.0–0.5)
Eosinophils Relative: 2 %
Immature Granulocytes: 0 %
Lymphocytes Relative: 21 %
Lymphs Abs: 2.2 10*3/uL (ref 0.7–4.0)
Monocytes Absolute: 1 10*3/uL (ref 0.1–1.0)
Monocytes Relative: 10 %
Neutro Abs: 6.8 10*3/uL (ref 1.7–7.7)
Neutrophils Relative %: 66 %

## 2023-07-12 LAB — HEMOGLOBIN A1C
Hgb A1c MFr Bld: 5.1 % (ref 4.8–5.6)
Mean Plasma Glucose: 99.67 mg/dL

## 2023-07-12 LAB — CBG MONITORING, ED: Glucose-Capillary: 100 mg/dL — ABNORMAL HIGH (ref 70–99)

## 2023-07-12 LAB — APTT: aPTT: 29 s (ref 24–36)

## 2023-07-12 MED ORDER — CLOPIDOGREL BISULFATE 75 MG PO TABS
75.0000 mg | ORAL_TABLET | Freq: Every day | ORAL | Status: DC
  Administered 2023-07-13: 75 mg via ORAL
  Filled 2023-07-12: qty 1

## 2023-07-12 MED ORDER — CARVEDILOL 3.125 MG PO TABS
3.1250 mg | ORAL_TABLET | Freq: Two times a day (BID) | ORAL | Status: DC
  Filled 2023-07-12: qty 1

## 2023-07-12 MED ORDER — SODIUM CHLORIDE 0.9% FLUSH
3.0000 mL | Freq: Once | INTRAVENOUS | Status: AC
  Administered 2023-07-12: 3 mL via INTRAVENOUS

## 2023-07-12 MED ORDER — ACETAMINOPHEN 325 MG PO TABS
650.0000 mg | ORAL_TABLET | Freq: Four times a day (QID) | ORAL | Status: DC | PRN
  Administered 2023-07-12 – 2023-07-13 (×2): 650 mg via ORAL
  Filled 2023-07-12 (×2): qty 2

## 2023-07-12 MED ORDER — METOPROLOL TARTRATE 12.5 MG HALF TABLET: 12.5000 mg | ORAL_TABLET | Freq: Two times a day (BID) | ORAL | Status: DC

## 2023-07-12 MED ORDER — ONDANSETRON HCL 4 MG/2ML IJ SOLN
4.0000 mg | Freq: Four times a day (QID) | INTRAMUSCULAR | Status: DC | PRN
Start: 2023-07-12 — End: 2023-07-14

## 2023-07-12 MED ORDER — STROKE: EARLY STAGES OF RECOVERY BOOK
Freq: Once | Status: AC
  Filled 2023-07-12: qty 1

## 2023-07-12 MED ORDER — ENOXAPARIN SODIUM 40 MG/0.4ML IJ SOSY
40.0000 mg | PREFILLED_SYRINGE | Freq: Every day | INTRAMUSCULAR | Status: DC
  Filled 2023-07-12: qty 0.4

## 2023-07-12 MED ORDER — POLYETHYLENE GLYCOL 3350 17 G PO PACK: 17.0000 g | PACK | Freq: Every day | ORAL | Status: DC | PRN

## 2023-07-12 MED ORDER — VITAMIN C 500 MG PO TABS
500.0000 mg | ORAL_TABLET | Freq: Every day | ORAL | Status: DC
  Administered 2023-07-13: 500 mg via ORAL
  Filled 2023-07-12: qty 1

## 2023-07-12 MED ORDER — LORAZEPAM 2 MG/ML IJ SOLN
0.5000 mg | Freq: Once | INTRAMUSCULAR | Status: AC
  Administered 2023-07-12: 0.5 mg via INTRAVENOUS
  Filled 2023-07-12: qty 1

## 2023-07-12 MED ORDER — ACETAMINOPHEN 650 MG RE SUPP: 650.0000 mg | Freq: Four times a day (QID) | RECTAL | Status: DC | PRN

## 2023-07-12 MED ORDER — CLOPIDOGREL BISULFATE 300 MG PO TABS
300.0000 mg | ORAL_TABLET | Freq: Once | ORAL | Status: AC
  Administered 2023-07-12: 300 mg via ORAL
  Filled 2023-07-12: qty 1

## 2023-07-12 MED ORDER — LORAZEPAM 2 MG/ML IJ SOLN: 1.0000 mg | Freq: Once | INTRAMUSCULAR | Status: DC

## 2023-07-12 MED ORDER — ONDANSETRON HCL 4 MG PO TABS
4.0000 mg | ORAL_TABLET | Freq: Four times a day (QID) | ORAL | Status: DC | PRN
Start: 2023-07-12 — End: 2023-07-14

## 2023-07-12 NOTE — Plan of Care (Signed)
   Problem: Education: Goal: Knowledge of disease or condition will improve Outcome: Progressing

## 2023-07-12 NOTE — ED Provider Notes (Signed)
 [place] EMERGENCY DEPARTMENT AT [facility] Provider Note   CSN: [id number] Arrival date & time: 07/12/23  1236     History  Chief Complaint  Patient presents with   Code Stroke    Margaret Sullivan is a 78 y.o. female.  Patient arrives as a code stroke.  She was having difficulty with speech visual field episode issues, coordination issues but now have seem to resolved.  Thought around 11 AM this occurred.  Hypertensive with EMS.  Sounds like may be ocular migraine history.  But not having headache now.  History of essential tremor high cholesterol hypertension.  Sounds like symptoms have now resolved but will proceed with images.  Neurology at the bedside.  Patient [place] any chest pain shortness of breath weakness numbness tingling at this time.  The history is provided by the patient.       Home Medications Prior to Admission medications   Medication Sig Start Date End Date Taking? Authorizing Provider  Acetaminophen  (TYLENOL  PO) Take by mouth.    [provider]  Ascorbic Acid (VITAMIN C) 500 MG CAPS Take by mouth.    [provider]  azelastine  (ASTELIN ) 0.1 % nasal spray Place 2 sprays into both nostrils 2 (two) times daily. Use in each nostril as directed 07/09/23   Patel, Kunjan B, MD  betamethasone dipropionate (DIPROLENE) 0.05 % ointment Apply topically 2 (two) times daily.    [provider]  Bevacizumab  (AVASTIN  IV) Inject into the vein. Every 12 weeks    [provider]  Biotin 5000 MCG CAPS Take by mouth. Patient not taking: Reported on 07/09/2023    [provider]  Cholecalciferol (D3 PO) Take 2,000 Units by mouth daily. Patient not taking: Reported on 07/09/2023    [provider]  fluticasone  (FLONASE ) 50 MCG/ACT nasal spray Place 2 sprays into both nostrils daily. 07/09/23   Evelina Hippo, MD      Allergies    Ampicillin, Aspirin, Doxycycline , Keflex [cephalexin], Macrodantin [nitrofurantoin  macrocrystal], Percocet [oxycodone-acetaminophen ], Propulsid [cisapride], Voltaren [diclofenac], Codeine, and Penicillins    Review of Systems   Review of Systems  Physical Exam Updated Vital Signs BP (!) 172/84 (BP Location: Right Arm)   Pulse 95   Temp 97.8 F (36.6 C) (Oral)   Resp 17   Ht 5\' 4"  (1.626 m)   Wt 69 kg   SpO2 98%   BMI 26.11 kg/m  Physical Exam Vitals and nursing note reviewed.  Constitutional:      General: She is not in acute distress.    Appearance: She is well-developed. She is not ill-appearing.  HENT:     Head: Normocephalic and atraumatic.     Nose: Nose normal.     Mouth/Throat:     Mouth: Mucous membranes are moist.  Eyes:     Extraocular Movements: Extraocular movements intact.     Conjunctiva/sclera: Conjunctivae normal.     Pupils: Pupils are equal, round, and reactive to light.  Cardiovascular:     Rate and Rhythm: Normal rate and regular rhythm.     Pulses: Normal pulses.     Heart sounds: Normal heart sounds. No murmur heard. Pulmonary:     Effort: Pulmonary effort is normal. No respiratory distress.     Breath sounds: Normal breath sounds.  Abdominal:     Palpations: Abdomen is soft.     Tenderness: There is no abdominal tenderness.  Musculoskeletal:        General: No swelling.  Cervical back: Normal range of motion and neck supple.  Skin:    General: Skin is warm and dry.     Capillary Refill: Capillary refill takes less than 2 seconds.  Neurological:     General: No focal deficit present.     Mental Status: She is alert and oriented to person, place, and time.     Cranial Nerves: No cranial nerve deficit.     Sensory: No sensory deficit.     Motor: No weakness.     Coordination: Coordination normal.     Comments: She has tremors in her upper extremities but otherwise 5+ out of 5 strength, normal sensation, normal coordination, normal speech, normal visual fields  Psychiatric:        Mood and Affect: Mood normal.     ED  Results / Procedures / Treatments   Labs (all labs ordered are listed, but only abnormal results are displayed) Labs Reviewed  COMPREHENSIVE METABOLIC PANEL WITH GFR - Abnormal; Notable for the following components:      Result Value   Glucose, Bld 102 (*)    All other components within normal limits  CBG MONITORING, ED - Abnormal; Notable for the following components:   Glucose-Capillary 100 (*)    All other components within normal limits  POCT I-STAT, CHEM 8 - Abnormal; Notable for the following components:   Calcium, Ion 1.04 (*)    All other components within normal limits  PROTIME-INR  APTT  CBC  DIFFERENTIAL  ETHANOL  HEMOGLOBIN A1C  I-STAT CHEM 8, ED    EKG EKG Interpretation Date/Time:  Friday Jul 12 2023 12:47:21 EDT Ventricular Rate:  96 PR Interval:  218 QRS Duration:  86 QT Interval:  373 QTC Calculation: 472 R Axis:   12  Text Interpretation: Sinus rhythm Prolonged PR interval Confirmed by Lowery Rue [phone]) on 07/12/2023 12:52:09 PM  Radiology CT HEAD CODE STROKE WO CONTRAST Result Date: 07/12/2023 CLINICAL DATA:  Code stroke. Neuro deficit, acute, stroke suspected. Left-sided weakness. Aphasia. Hypertensive. EXAM: CT HEAD WITHOUT CONTRAST TECHNIQUE: Contiguous axial images were obtained from the base of the skull through the vertex without intravenous contrast. RADIATION DOSE REDUCTION: This exam was performed according to the departmental dose-optimization program which includes automated exposure control, adjustment of the mA and/or kV according to patient size and/or use of iterative reconstruction technique. COMPARISON:  None. FINDINGS: Brain: No age-advanced or lobar predominant cerebral atrophy. 2.5 x 1.2 cm choroidal fissure cyst on the left. There is no acute intracranial hemorrhage. No demarcated cortical infarct. No evidence of an intracranial mass. No midline shift. Vascular: No hyperdense vessel.  Atherosclerotic calcifications. Skull: No calvarial  fracture or aggressive osseous lesion. Sinuses/Orbits: No mass or acute finding within the imaged orbits. No significant paranasal sinus disease. ASPECTS [facility] Stroke Program Early CT Score) - Ganglionic level infarction (caudate, lentiform nuclei, internal capsule, insula, M1-M3 cortex): 7 - Supraganglionic infarction (M4-M6 cortex): 3 Total score (0-10 with 10 being normal): 10 No evidence of an acute intracranial abnormality. These results were communicated to Dr. Murvin Arthurs At 12:45 pmon 5/9/2025by text page via the [facility] messaging system. IMPRESSION: 1.  No evidence of an acute intracranial abnormality. 2. 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 Electronically Signed   By: Bascom Lily D.O.   On: 07/12/2023 12:46    Procedures Procedures    Medications Ordered in ED Medications  clopidogrel (PLAVIX) tablet 75 mg (has no administration in time range)   stroke: early stages of recovery book (  has no administration in time range)  sodium chloride  flush (NS) 0.9 % injection 3 mL (3 mLs Intravenous Given 07/12/23 1259)  clopidogrel (PLAVIX) tablet 300 mg (300 mg Oral Given 07/12/23 1259)    ED Course/ Medical Decision Making/ A&P                                 Medical Decision Making Amount and/or Complexity of Data Reviewed Labs: ordered. Radiology: ordered.   CERAH RYCHLIK is here with strokelike symptoms.  Code stroke enacted in the field.  Blood pressure elevated in the 200s in the field.  No headache.  She was having speech issues aphasia inability get words out, vision changes, coordination issues but now resolved.  This occurred for maybe about an hour possibly.  She is got a history of hypertension high cholesterol essential tremor.  Neurologic exam is normal upon arrival.  Neurology team at the bedside as well but will proceed with stroke workup.  Head CT was unremarkable.  Lab work unremarkable.  I reviewed interpreted labs and imaging.  Ultimately EKG also per my review  interpretation without any evidence of A-fib or other acute ischemic processes or arrhythmias.  Lab work reassuring.  Will admit for TIA workup to medicine team.  This chart was dictated using voice recognition software.  Despite best efforts to proofread,  errors can occur which can change the documentation meaning.         Final Clinical Impression(s) / ED Diagnoses Final diagnoses:  TIA (transient ischemic attack)    Rx / DC Orders ED Discharge Orders     None         Lowery Rue, DO 07/12/23 1336

## 2023-07-12 NOTE — Consult Note (Signed)
 NEUROLOGY CONSULT NOTE   Date of service: Jul 12, 2023 Patient Name: Margaret Sullivan MRN:  [id number] DOB:  27-Sep-1945 Chief Complaint: "CODE STROKE" Requesting Provider: Lowery Rue, DO  History of Present Illness  Margaret Sullivan is a 78 y.o. female with hx of HTN, HLD, Ocular Migraines who was BIB EMS as an activated CODE STROKE due to aphasia and vision changes.  On exam at bridge, patient has acute no neurological deficits; awake and alert, fully oriented, no vision or speech deficits, no focal weakness. Denies headache but stated that she did have one earlier.   LKW: 1100 Modified rankin score: 0-Completely asymptomatic and back to baseline post- stroke IV Thrombolysis: No, no neurological deficits EVT: No, no LVO  NIHSS components Score: Comment  1a Level of Conscious 0[x]  1[]  2[]  3[]      1b LOC Questions 0[x]  1[]  2[]       1c LOC Commands 0[x]  1[]  2[]       2 Best Gaze 0[x]  1[]  2[]       3 Visual 0[x]  1[]  2[]  3[]      4 Facial Palsy 0[x]  1[]  2[]  3[]      5a Motor Arm - left 0[x]  1[]  2[]  3[]  4[]  UN[]    5b Motor Arm - Right 0[x]  1[]  2[]  3[]  4[]  UN[]    6a Motor Leg - Left 0[x]  1[]  2[]  3[]  4[]  UN[]    6b Motor Leg - Right 0[x]  1[]  2[]  3[]  4[]  UN[]    7 Limb Ataxia 0[x]  1[]  2[]  UN[]      8 Sensory 0[x]  1[]  2[]  UN[]      9 Best Language 0[x]  1[]  2[]  3[]      10 Dysarthria 0[x]  1[]  2[]  UN[]      11 Extinct. and Inattention 0[x]  1[]  2[]       TOTAL:   0      ROS  Comprehensive ROS performed and pertinent positives documented in HPI   Past History   Past Medical History:  Diagnosis Date   Abnormal glucose    Allergy    Essential tremor    GERD (gastroesophageal reflux disease)    Heart murmur    High cholesterol    Hyperlipidemia    Hypertension    labile   Labile hypertension    Medication management 06/10/2013   Prediabetes     Past Surgical History:  Procedure Laterality Date   BACK SURGERY  2001   COLONOSCOPY     TONSILLECTOMY AND ADENOIDECTOMY Bilateral 1950    UPPER GASTROINTESTINAL ENDOSCOPY     WISDOM TOOTH EXTRACTION Bilateral 1969    Family History: Family History  Problem Relation Age of Onset   Lymphoma Mother        NHL   Heart attack Father    Breast cancer Father    Colon cancer Maternal Uncle    Esophageal cancer Neg Hx    Stomach cancer Neg Hx    Rectal cancer Neg Hx     Social History  reports that she has never smoked. She has never used smokeless tobacco. She reports that she does not drink alcohol and does not use drugs.  Allergies  Allergen Reactions   Ampicillin Hives and Swelling   Aspirin Itching   Doxycycline  Nausea And Vomiting   Keflex [Cephalexin] Hives    Itching   Macrodantin [Nitrofurantoin Macrocrystal] Hives, Itching and Swelling   Percocet [Oxycodone-Acetaminophen ]    Propulsid [Cisapride] Itching and Swelling   Voltaren [Diclofenac] Hives and Itching   Codeine Palpitations   Penicillins Swelling and Rash  Medications   Current Facility-Administered Medications:    [START ON 07/13/2023] clopidogrel (PLAVIX) tablet 75 mg, 75 mg, Oral, Daily, Mileydi Milsap, MD  Current Outpatient Medications:    Acetaminophen  (TYLENOL  PO), Take by mouth., Disp: , Rfl:    Ascorbic Acid (VITAMIN C) 500 MG CAPS, Take by mouth., Disp: , Rfl:    azelastine  (ASTELIN ) 0.1 % nasal spray, Place 2 sprays into both nostrils 2 (two) times daily. Use in each nostril as directed, Disp: 30 mL, Rfl: 12   betamethasone dipropionate (DIPROLENE) 0.05 % ointment, Apply topically 2 (two) times daily., Disp: , Rfl:    Bevacizumab  (AVASTIN  IV), Inject into the vein. Every 12 weeks, Disp: , Rfl:    Biotin 5000 MCG CAPS, Take by mouth. (Patient not taking: Reported on 07/09/2023), Disp: , Rfl:    Cholecalciferol (D3 PO), Take 2,000 Units by mouth daily. (Patient not taking: Reported on 07/09/2023), Disp: , Rfl:    fluticasone  (FLONASE ) 50 MCG/ACT nasal spray, Place 2 sprays into both nostrils daily., Disp: 11 mL, Rfl: 6  Vitals    Vitals:   07/28/23 1200 07/28/23 1248  BP:  (!) 172/84  Pulse:  95  Resp:  17  Temp:  97.8 F (36.6 C)  TempSrc:  Oral  SpO2:  98%  Weight: 69 kg   Height: 5\' 4"  (1.626 m)     Body mass index is 26.11 kg/m.  Physical Exam   Constitutional: Appears well-developed and well-nourished.  Psych: Affect appropriate to situation.  Cardiovascular: Normal rate and regular rhythm. Hypertensive Respiratory: Effort normal, non-labored breathing. Room air.   Neurologic Examination   Neuro: Mental Status: Patient is awake, alert, oriented to person, place, month, year, and situation. Patient is able to give a clear and coherent history. No signs of dysarthria, aphasia or neglect Cranial Nerves: II: Visual Fields are full. Pupils are equal, round, and reactive to light.   III,IV, VI: EOMI without ptosis or diploplia.  V: Facial sensation is symmetric to light touch VII: Facial movement is symmetric.  VIII: hearing is intact to voice X: Uvula elevates symmetrically XI: Shoulder shrug is symmetric. XII: tongue is midline without atrophy or fasciculations.  Motor: Tone is normal. Bulk is normal. 5/5 strength was present in all four extremities.  Sensory: Sensation is symmetric to light touch in the arms and legs. Cerebellar: FNF and HKS are intact bilaterally   Labs/Imaging/Neurodiagnostic studies   CBC:  Recent Labs  Lab Jul 28, 2023 1223 2023-07-28 1228  WBC 10.3  --   NEUTROABS 6.8  --   HGB 13.8 13.6  HCT 39.8 40.0  MCV 89.0  --   PLT 225  --    Basic Metabolic Panel:  Lab Results  Component Value Date   NA 138 Jul 28, 2023   K 3.8 2023-07-28   CO2 25 11/01/2022   GLUCOSE 98 Jul 28, 2023   BUN 13 07-28-23   CREATININE 0.90 07/28/23   CALCIUM 9.7 11/01/2022   GFRNONAA 74 02/09/2020   GFRAA 85 02/09/2020   Lipid Panel:  Lab Results  Component Value Date   LDLCALC 159 (H) 11/01/2022   HgbA1c:  Lab Results  Component Value Date   HGBA1C 5.8 (H) 06/26/2022    Urine Drug Screen: No results found for: "LABOPIA", "COCAINSCRNUR", "LABBENZ", "AMPHETMU", "THCU", "LABBARB"  Alcohol Level No results found for: "ETH" INR  Lab Results  Component Value Date   INR 1.1 07/28/23   APTT  Lab Results  Component Value Date   APTT 29 07-28-2023  AED levels: No results found for: "PHENYTOIN", "ZONISAMIDE", "LAMOTRIGINE", "LEVETIRACETA"  CT Head without contrast(Personally reviewed): No evidence of an acute intracranial abnormality. 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   MRI Brain(Personally reviewed): PENDING   ASSESSMENT   Margaret Sullivan is a 78 y.o. female with hx of HTH, HLD, Ocular Migraines who was BIB EMS as an activated CODE STROKE due to aphasia and vision changes.  On exam at bridge, patient has acute no neurological deficits; awake and alert, fully oriented, no vision or speech deficits, no focal weakness. Denies headache but stated that she did have one earlier.  Not a candidate for TNK as symptoms have completely resolved. No LVO suspected due to resolved symptoms.   Impression: TIA versus Complicated Migraine  RECOMMENDATIONS  - Frequent Neuro checks per stroke unit protocol - Reactivation of CODE STROKE and STAT CT with any acute change in neuro status - MRI Brain stroke protocol - Vascular imaging - Carotid US  - TTE - Lipid panel - Statin - will be started if LDL>70 or otherwise medically indicated - A1C - Antithrombotic - Plavix load, followed by maintenance dosing starting 5/10. Patient has allergy to ASA.  - DVT ppx - lovenox - SBP goal - Normotensive goal.  - Telemetry monitoring for arrhythmia - 72h - Swallow screen - will be performed prior to PO intake - Stroke education - will be given - PT/OT/SLP  - Dispo: Admit for TIA workup  ______________________________________________________________________    Signed, Audrene Lease, NP [facility]  NEUROHOSPITALIST ADDENDUM Performed a face to  face diagnostic evaluation.   I have reviewed the contents of history and physical exam as documented by PA/ARNP/Resident and agree with above documentation.  I have discussed and formulated the above plan as documented. Edits to the note have been made as needed.  Impression/Key exam findings/Plan: had word finding difficulty while shopping. Reports history of ocular migraines and so presumed it was that but tells me that she has never had this with her migraines before. L arm felt heavy. Symptoms had resolved on arrival to the ED. Suspect TIA.  Mazi Brailsford, MD [facility] [phone]   If 7pm to 7am, please call on call as listed on AMION.

## 2023-07-12 NOTE — ED Notes (Signed)
 CCMD called.

## 2023-07-12 NOTE — H&P (Signed)
 ADMISSION HISTORY AND PHYSICAL   Margaret Sullivan HYQ:[id number] DOB: August 31, 1945 DOA: 07/12/2023  PCP: Versa Gore, NP Patient coming from: home via [facility] ER  Chief Complaint: transient visual field alterations, followed by word finding difficulty, then HA  HPI:  78yo with a hx of essential tremor, HLD, HTN who presented to the ER with the acute onset of speech difficulty with visual field alterations of abrupt onset at 11am. By the time she has reported to the ER her sx had resolved. BP was noted to be in the 200s systolic in the ED. she denied chest pain shortness of breath fevers or chills.  She has been undergoing an outpatient workup for possible acute sinusitis as well as the abrupt onset of right sided hearing loss.  Review of systems reveals a history of complex migraines intermittently through the course of her life.  Assessment/Plan  TIA v/s complex migraine I favor the later diagnosis based on hx - TIA w/u underway - sx fully resolved at time of admission, apart from low grade smoldering HA behind R eye   HLD Continue usual home medication   Uncontrolled HTN Likely due to anxiety, and less likely to be the etiology of her sx - begin med tx and follow   DVT prophylaxis: Lovenox Code Status: Full code Family Communication: No family present at time of exam Disposition Plan: Place in observation  Review of Systems: As per HPI otherwise 10 point review of systems negative.   Past Medical History:  Diagnosis Date   Abnormal glucose    Allergy    Essential tremor    GERD (gastroesophageal reflux disease)    Heart murmur    High cholesterol    Hyperlipidemia    Hypertension    labile   Labile hypertension    Medication management 06/10/2013   Prediabetes     Past Surgical History:  Procedure Laterality Date   BACK SURGERY  2001   COLONOSCOPY     TONSILLECTOMY AND ADENOIDECTOMY Bilateral 1950   UPPER GASTROINTESTINAL ENDOSCOPY     WISDOM TOOTH EXTRACTION  Bilateral 1969    Family History  Family History  Problem Relation Age of Onset   Lymphoma Mother        NHL   Heart attack Father    Breast cancer Father    Colon cancer Maternal Uncle    Esophageal cancer Neg Hx    Stomach cancer Neg Hx    Rectal cancer Neg Hx     Social History   reports that she has never smoked. She has never used smokeless tobacco. She reports that she does not drink alcohol and does not use drugs.  Allergies Allergies  Allergen Reactions   Ampicillin Hives and Swelling   Aspirin Itching    Spoke with patient and she endorsed itching on the top of her head when she took ASA awhile back. Allergy was listed in 2015.   Doxycycline  Nausea And Vomiting   Keflex [Cephalexin] Hives    Itching   Macrodantin [Nitrofurantoin Macrocrystal] Hives, Itching and Swelling   Percocet [Oxycodone-Acetaminophen ]    Propulsid [Cisapride] Itching and Swelling   Voltaren [Diclofenac] Hives and Itching   Codeine Palpitations   Penicillins Swelling and Rash    Prior to Admission medications   Medication Sig Start Date End Date Taking? Authorizing Provider  Acetaminophen  (TYLENOL  PO) Take by mouth.    [provider]  Ascorbic Acid (VITAMIN C) 500 MG CAPS Take by mouth.    [provider]  azelastine  (ASTELIN ) 0.1 % nasal spray Place 2 sprays into both nostrils 2 (two) times daily. Use in each nostril as directed 07/09/23   Patel, Kunjan B, MD  betamethasone dipropionate (DIPROLENE) 0.05 % ointment Apply topically 2 (two) times daily.    [provider]  Bevacizumab  (AVASTIN  IV) Inject into the vein. Every 12 weeks    [provider]  Biotin 5000 MCG CAPS Take by mouth. Patient not taking: Reported on 07/09/2023    [provider]  Cholecalciferol (D3 PO) Take 2,000 Units by mouth daily. Patient not taking: Reported on 07/09/2023    [provider]  fluticasone  (FLONASE ) 50 MCG/ACT nasal spray Place 2 sprays into both  nostrils daily. 07/09/23   Evelina Hippo, MD    Physical Exam: Vitals:   07/12/23 1200 07/12/23 1248  BP:  (!) 172/84  Pulse:  95  Resp:  17  Temp:  97.8 F (36.6 C)  TempSrc:  Oral  SpO2:  98%  Weight: 69 kg   Height: 5\' 4"  (1.626 m)     Constitutional: NAD, calm, comfortable Eyes: PERRL, lids and conjunctivae normal ENMT: Mucous membranes are moist. Posterior pharynx clear of any exudate or lesions. Normal dentition.  Neck: normal, supple, no masses, no thyromegaly Respiratory: clear to auscultation bilaterally, no wheezing, no crackles. Normal respiratory effort. No accessory muscle use.  Cardiovascular: Regular rate and rhythm, no murmurs / rubs / gallops. No extremity edema. 2+ pedal pulses.  Abdomen: No tenderness or masses to palpation. No hepatosplenomegaly. Bowel sounds positive. Not distended. Soft.  Musculoskeletal: No clubbing / cyanosis. No joint deformity upper and lower extremities. No contractures. Normal muscle tone.  Skin: No rashes, lesions, ulcers.  Neurologic: CN 2-12 grossly intact B. Sensation intact. Strength 5/5 in all 4 extremities.  Psychiatric: Normal judgment and insight. Alert and oriented x 3. Normal mood.    Labs on Admission:   CBC: Recent Labs  Lab 07/12/23 1223 07/12/23 1228  WBC 10.3  --   NEUTROABS 6.8  --   HGB 13.8 13.6  HCT 39.8 40.0  MCV 89.0  --   PLT 225  --    Basic Metabolic Panel: Recent Labs  Lab 07/12/23 1223 07/12/23 1228  NA 139 138  K 3.7 3.8  CL 104 106  CO2 23  --   GLUCOSE 102* 98  BUN 11 13  CREATININE 0.77 0.90  CALCIUM 9.3  --    GFR: Estimated Creatinine Clearance: 49.9 mL/min (by C-G formula based on SCr of 0.9 mg/dL).  Liver Function Tests: Recent Labs  Lab 07/12/23 1223  AST 32  ALT 23  ALKPHOS 70  BILITOT 1.2  PROT 8.1  ALBUMIN 4.4    Recent Labs  Lab 07/12/23 1223  INR 1.1   CBG: Recent Labs  Lab 07/12/23 1224  GLUCAP 100*    Radiological Exams on Admission: CT HEAD  CODE STROKE WO CONTRAST Result Date: 07/12/2023 IMPRESSION: 1.  No evidence of an acute intracranial abnormality. 2. 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 Electronically Signed   By: Bascom Lily D.O.   On: 07/12/2023 12:46    Abbe Abate, MD [facility] Office  [phone] Pager - Text Page per Amion as per below:  On-Call/Text Page:      [email or web address]  If 7PM-7AM, please contact night-coverage [email or web address] 07/12/2023, 1:44 PM

## 2023-07-12 NOTE — Code Documentation (Signed)
 Stroke Response Nurse Documentation Code Documentation  Margaret Sullivan is a 78 y.o. female arriving to [facility]  via [facility] on 07/12/2023 with past medical hx of HTN HLD. On No antithrombotic. Code stroke was activated by EMS.   Patient from home where she was LKW at 1100 and now complaining of aphasia and vision changes.  Stroke team at the bedside on patient arrival. Labs drawn and patient cleared for CT by EDP. Patient to CT with team. NIHSS 0, see documentation for details and code stroke times. Patient with no deficits on exam. The following imaging was completed:  CT Head. Patient is not a candidate for IV Thrombolytic due to symptoms resolved. Patient is not a candidate for IR due to symptoms resolved..   Care Plan: VS and NIHSS q 2. Reactivate code stroke if deficit reoccur.    Bedside handoff with ED RN Edwina Gram.  Farron Watrous Livengood  Stroke Response RN

## 2023-07-12 NOTE — Progress Notes (Signed)
  Echocardiogram 2D Echocardiogram has been performed.  Verble Styron L Reshma Hoey RDCS 07/12/2023, 2:54 PM

## 2023-07-13 ENCOUNTER — Observation Stay (HOSPITAL_COMMUNITY)

## 2023-07-13 DIAGNOSIS — E785 Hyperlipidemia, unspecified: Secondary | ICD-10-CM

## 2023-07-13 DIAGNOSIS — G459 Transient cerebral ischemic attack, unspecified: Secondary | ICD-10-CM | POA: Diagnosis not present

## 2023-07-13 LAB — LIPID PANEL
Cholesterol: 206 mg/dL — ABNORMAL HIGH (ref 0–200)
HDL: 28 mg/dL — ABNORMAL LOW (ref >40)
LDL Cholesterol: 141 mg/dL — ABNORMAL HIGH (ref 0–99)
Total CHOL/HDL Ratio: 7.4 ratio
Triglycerides: 183 mg/dL — ABNORMAL HIGH (ref <150)
VLDL: 37 mg/dL (ref 0–40)

## 2023-07-13 MED ORDER — CLOPIDOGREL BISULFATE 75 MG PO TABS: 75.0000 mg | ORAL_TABLET | Freq: Every day | ORAL | 2 refills | Status: DC

## 2023-07-13 MED ORDER — IOHEXOL 350 MG/ML SOLN
75.0000 mL | Freq: Once | INTRAVENOUS | Status: AC | PRN
  Administered 2023-07-13: 75 mL via INTRAVENOUS

## 2023-07-13 NOTE — Discharge Summary (Signed)
 DISCHARGE SUMMARY  Margaret Sullivan  MR#: [id number]  DOB:[birth year]  Date of Admission: 07/12/2023 Date of Discharge: 07/13/2023  Attending Physician:Medora Roorda Constantine Delude, MD  Patient's WUJ:[id number], Margaret Fudge, NP  Disposition: D/C home   Follow-up Appts:  Follow-up Information     Versa Gore, NP Follow up in 7 day(s).   Specialty: Family Medicine Contact information: [address] [place] [address] [phone]                 Tests Needing Follow-up: -Ask patient if she is now willing to begin a statin, or at least Zetia  -Assess blood pressure control and encourage patient to reconsider antihypertensive medications  Discharge Diagnoses: TIA versus complex migraine HLD Uncontrolled HTN  Initial presentation: 77yo with a hx of essential tremor, HLD, and HTN who presented to the ER with the acute onset of speech difficulty with visual field alterations of abrupt onset at 11am. By the time she reported to the ER her sx had resolved. BP was noted to be in the 200s systolic in the ED. She has been undergoing an outpatient workup for possible acute sinusitis as well as the abrupt onset of right sided hearing loss.  Review of systems revealed a history of complex migraines intermittently through the course of her life.   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 -TTE noted EF 60-65% with no WMA and normal RV function -trivial mitral valve regurgitation but no stenosis -CTa of head and neck pending, but felt to have a high likelihood of being unrevealing, therefore patient was not required to remain  hospitalized until results available - any significant findings will be discussed with her via telephone following her d/c  -medical treatment: Neurology loaded the patient with Plavix - Plavix to continue daily -LDL 141, but patient was not willing to try a statin - she is considering zetia  and will discuss with her PCP as an outpt  -A1c 5.1 -PT/OT/SLP suggested no need for ongoing tx    HLD Does not appear to be on chronic medical therapy -is not willing to initiate therapy now   Uncontrolled HTN Medical management proving difficult as patient has refused 2 different blood pressure medications - patient has been counseled on need to comply with medical therapy and consequences of ongoing uncontrolled HTN -at this time she intends to continue lifestyle modifications instead of medical therapy -she is advised to follow-up with her PCP for further discussion  Allergies as of 07/13/2023       Reactions   Ampicillin Hives, Swelling   Aspirin Itching   Spoke with patient and she endorsed itching on the top of her head when she took ASA awhile back. Allergy was listed in 2015.   Doxycycline  Nausea And Vomiting   Keflex [cephalexin] Hives   Itching   Macrodantin [nitrofurantoin Macrocrystal] Hives, Itching, Swelling   Percocet [oxycodone-acetaminophen ]    Propulsid [cisapride] Itching, Swelling   Voltaren [diclofenac] Hives, Itching   Codeine Palpitations   Penicillins Swelling, Rash        Medication List     TAKE these medications    AVASTIN  IV Inject into the vein. Every 12 weeks eye injection - left eye   betamethasone  dipropionate 0.05 % ointment Commonly known as: DIPROLENE Apply 1 Application topically as needed (outbreaks).   clopidogrel 75 MG tablet Commonly known as: PLAVIX Take 1 tablet (75 mg total) by mouth daily. Start taking on: Jul 14, 2023   D3 PO Take 1 capsule by mouth daily.   PRESCRIPTION MEDICATION Place 4 drops into the left eye daily. Unknown eye  drop after - eye injection only for 2 days until next injection   Vitamin C 500 MG Caps Take 500 mg by mouth daily.        Day of Discharge BP (!) 176/73 (BP Location: Right Arm)   Pulse 79   Temp 98.1 F (36.7 C) (Oral)   Resp 20   Ht 5\' 4"  (1.626 m)   Wt 66.7 kg   SpO2 98%   BMI 25.24 kg/m   Physical Exam: General: No acute respiratory distress Lungs: Clear to auscultation bilaterally without wheezes or crackles Cardiovascular: Regular rate and rhythm without murmur gallop or rub normal S1 and S2 Abdomen: Nontender, nondistended, soft, bowel sounds positive, no rebound, no ascites, no appreciable mass Extremities: No significant cyanosis, clubbing, or edema bilateral lower extremities  Basic Metabolic Panel: Recent Labs  Lab 07/12/23 1223 07/12/23 1228  NA 139 138  K 3.7 3.8  CL 104 106  CO2 23  --   GLUCOSE 102* 98  BUN 11 13  CREATININE 0.77 0.90  CALCIUM 9.3  --     CBC: Recent Labs  Lab 07/12/23 1223 07/12/23 1228  WBC 10.3  --   NEUTROABS 6.8  --   HGB 13.8 13.6  HCT 39.8 40.0  MCV 89.0  --   PLT 225  --     Time spent in discharge (includes decision making & examination of pt): 30 minutes  07/13/2023, 5:43 PM   Abbe Abate, MD [facility] Office  [phone]

## 2023-07-13 NOTE — Care Management Obs Status (Signed)
 MEDICARE OBSERVATION STATUS NOTIFICATION   Patient Details  Name: Margaret Sullivan MRN: [id number] Date of Birth: 01/26/1946   Medicare Observation Status Notification Given:  Yes    Evelena Masci G., RN 07/13/2023, 9:20 AM

## 2023-07-13 NOTE — Progress Notes (Signed)
 PT Cancellation/Discharge Note  Patient Details Name: Margaret Sullivan MRN: [id number] DOB: 19-May-1945   Cancelled Treatment:    Reason Eval/Treat Not Completed: OT screened, no needs identified, will sign off.  OT screened for PT needs.  No needs identified.  PT to sign off.  Thanks,  [name], PT, DPT  Acute Rehabilitation Secure chat is best for contact #[phone] office      [name] 07/13/2023, 11:59 AM

## 2023-07-13 NOTE — Discharge Instructions (Signed)
 You should  NOT take "TRIPTAN" medications (sumatriptan, rizatriptan, zolmitriptan, naratriptan) for your complex migraine headaches, as there is a possibility they could worsen your neurologic symptoms.

## 2023-07-13 NOTE — Evaluation (Signed)
 Occupational Therapy Evaluation and DC Summary Patient Details Name: Margaret Sullivan MRN: [id number] DOB: 1945-07-27 Today's Date: 07/13/2023   History of Present Illness   Pt is a 78 yo female admitted to [facility] on 07/12/23 with aphasia and vision changes. Imaging is negative for acute abnormalities. PMH of HTN, HLD, ocular migraines.     Clinical Impressions Pt admitted for above, PTA pt reports being fairly active and ambulating no AD lately and being ind with ADLs/iADLs. Pt currently presenting close to her baseline and ambulating with supervision for safety no AD and completing ADLs with supervision to Mod I level. She verbalized understanding of BEFAST education after reviewing it and reports using stress management strategies lately to offset her workload. No strength, balance, visual, or motor planning deficits noted that deviate from her baseline. Pt has no further acute skilled OT needs, no post acute OT recommended.      If plan is discharge home, recommend the following:   Other (comment) (prn)     Functional Status Assessment   Patient has not had a recent decline in their functional status     Equipment Recommendations   None recommended by OT     Recommendations for Other Services         Precautions/Restrictions   Precautions Precautions: Fall (low fall riskk) Recall of Precautions/Restrictions: Intact Restrictions Weight Bearing Restrictions Per Provider Order: No     Mobility Bed Mobility Overal bed mobility: Independent                  Transfers Overall transfer level: Modified independent Equipment used: None                      Balance Overall balance assessment: Mild deficits observed, not formally tested                                         ADL either performed or assessed with clinical judgement   ADL Overall ADL's : Needs assistance/impaired Eating/Feeding: Independent;Sitting   Grooming:  Independent;Oral care;Standing   Upper Body Bathing: Standing;Independent   Lower Body Bathing: Supervison/ safety;Sit to/from stand   Upper Body Dressing : Independent;Sitting   Lower Body Dressing: Supervision/safety;Sit to/from stand   Toilet Transfer: Supervision/safety;Ambulation   Toileting- Clothing Manipulation and Hygiene: Supervision/safety;Sit to/from stand       Functional mobility during ADLs: Supervision/safety General ADL Comments: Educated pt on BEFAST stroke education Discussed stress mangement and potentially reducing the amount of workload placed on her from supporting others     Vision Baseline Vision/History: 4 Cataracts;1 Wears glasses (return to catarcts beginning pt reports.) Ability to See in Adequate Light: 0 Adequate Patient Visual Report: No change from baseline;Other (comment) (other than what has mentioned in baseline history) Vision Assessment?: Yes Eye Alignment: Within Functional Limits Ocular Range of Motion: Within Functional Limits Alignment/Gaze Preference: Within Defined Limits Tracking/Visual Pursuits: Able to track stimulus in all quads without difficulty Convergence: Within functional limits Visual Fields: No apparent deficits Additional Comments: Pt very on top of her health, notes some vision challenges at baseline and has a follow-up with the eye doctor coming up on the 21st     Perception Perception: Within Functional Limits       Praxis Praxis: [facility]       Pertinent Vitals/Pain Pain Assessment Pain Assessment: Faces Faces Pain Scale: Hurts a little bit Pain  Location: headache Pain Descriptors / Indicators: Headache Pain Intervention(s): Premedicated before session, Monitored during session     Extremity/Trunk Assessment Upper Extremity Assessment Upper Extremity Assessment: Overall WFL for tasks assessed (BUE MMT 5/5 throughout, sensation intact bilat)   Lower Extremity Assessment Lower Extremity Assessment: Overall WFL  for tasks assessed   Cervical / Trunk Assessment Cervical / Trunk Assessment: Normal   Communication Communication Communication: No apparent difficulties Factors Affecting Communication: Hearing impaired (baseline R ear deficits)   Cognition Arousal: Alert Behavior During Therapy: WFL for tasks assessed/performed Cognition: No apparent impairments                               Following commands: Intact       Cueing  General Comments   Cueing Techniques: Verbal cues  VSS on RA, HR in the 90s with mobility   Exercises     Shoulder Instructions      Home Living Family/patient expects to be discharged to:: Private residence Living Arrangements: Other relatives (brother) Available Help at Discharge: Family;Available 24 hours/day Type of Home: House Home Access: Stairs to enter [employer] of Steps: 4 Entrance Stairs-Rails: Right;Left Home Layout: One level     Bathroom Shower/Tub: Tub/shower unit (small)   Bathroom Toilet: Handicapped height         Additional Comments: fall in june secondry to syncope      Prior Functioning/Environment Prior Level of Function : Independent/Modified Independent;Driving             Mobility Comments: Ind no AD ADLs Comments: ind    OT Problem List: Impaired vision/perception (resolved deficits)   OT Treatment/Interventions:        OT Goals(Current goals can be found in the care plan section)   Acute Rehab OT Goals OT Goal Formulation: All assessment and education complete, DC therapy Time For Goal Achievement: 07/27/23 Potential to Achieve Goals: Good   OT Frequency:       Co-evaluation              AM-PAC OT "6 Clicks" Daily Activity     Outcome Measure Help from another person eating meals?: None Help from another person taking care of personal grooming?: None Help from another person toileting, which includes using toliet, bedpan, or urinal?: A Little Help from another  person bathing (including washing, rinsing, drying)?: A Little Help from another person to put on and taking off regular upper body clothing?: None Help from another person to put on and taking off regular lower body clothing?: A Little 6 Click Score: 21   End of Session Nurse Communication: Mobility status  Activity Tolerance: Patient tolerated treatment well Patient left: in bed;with call bell/phone within reach  OT Visit Diagnosis: Cognitive communication deficit (R41.841);Low vision, both eyes (H54.2) Symptoms and signs involving cognitive functions: Other cerebrovascular disease (TIA)                Time: 4098-1191 OT Time Calculation (min): 40 min Charges:  OT General Charges $OT Visit: 1 Visit OT Evaluation $OT Eval Low Complexity: 1 Low OT Treatments $Self Care/Home Management : 8-22 mins $Therapeutic Activity: 8-22 mins  07/13/2023  AB, OTR/L  Acute Rehabilitation Services  Office: [phone]   Margaret Sullivan 07/13/2023, 10:16 AM

## 2023-07-13 NOTE — Plan of Care (Signed)

## 2023-07-13 NOTE — Progress Notes (Addendum)
 STROKE TEAM PROGRESS NOTE    INTERIM HISTORY/SUBJECTIVE  Patient has remained hemodynamically stable and afebrile overnight.  She reports no recurrence of her symptoms.  MRI was negative for stroke.  OBJECTIVE  CBC    Component Value Date/Time   WBC 10.3 07/12/2023 1223   RBC 4.47 07/12/2023 1223   HGB 13.6 07/12/2023 1228   HCT 40.0 07/12/2023 1228   PLT 225 07/12/2023 1223   MCV 89.0 07/12/2023 1223   MCH 30.9 07/12/2023 1223   MCHC 34.7 07/12/2023 1223   RDW 12.2 07/12/2023 1223   LYMPHSABS 2.2 07/12/2023 1223   MONOABS 1.0 07/12/2023 1223   EOSABS 0.2 07/12/2023 1223   BASOSABS 0.1 07/12/2023 1223    BMET    Component Value Date/Time   NA 138 07/12/2023 1228   K 3.8 07/12/2023 1228   CL 106 07/12/2023 1228   CO2 23 07/12/2023 1223   GLUCOSE 98 07/12/2023 1228   BUN 13 07/12/2023 1228   CREATININE 0.90 07/12/2023 1228   CREATININE 0.73 11/01/2022 1229   CALCIUM 9.3 07/12/2023 1223   EGFR 85 11/01/2022 1229   GFRNONAA >60 07/12/2023 1223   GFRNONAA 74 02/09/2020 1640    IMAGING past 24 hours MR BRAIN WO CONTRAST Result Date: 07/12/2023 CLINICAL DATA:  Initial evaluation for acute neuro deficit, stroke suspected. EXAM: MRI HEAD WITHOUT CONTRAST TECHNIQUE: Multiplanar, multiecho pulse sequences of the brain and surrounding structures were obtained without intravenous contrast. COMPARISON:  CT from earlier the same day. FINDINGS: Brain: Cerebral volume within normal limits. Patchy T2/FLAIR hyperintensity involving the periventricular white matter, most characteristic of chronic microvascular ischemic disease, mild in nature. 3 cm benign choroidal fissure cyst noted at the left temporal lobe. Subcentimeter dilated perivascular space versus tiny neural glial cyst noted at the right cerebellum. No evidence for acute or subacute ischemia. Gray-white matter differentiation maintained. No acute or chronic intracranial blood products. No other mass lesion, midline shift or mass  effect. No hydrocephalus or extra-axial fluid collection. Pituitary gland within normal limits. Vascular: Major intracranial vascular flow voids are maintained. Skull and upper cervical spine: Craniocervical junction within normal limits. Bone marrow signal intensity normal. No scalp soft tissue abnormality. Sinuses/Orbits: Globes and orbital soft tissues within normal limits. Paranasal sinuses are clear. Trace right mastoid effusion, bowel significance. Other: None. IMPRESSION: 1. No acute intracranial abnormality. 2. Mild chronic microvascular ischemic disease for age. Electronically Signed   By: Virgia Griffins M.D.   On: 07/12/2023 18:49   ECHOCARDIOGRAM COMPLETE Result Date: 07/12/2023    ECHOCARDIOGRAM REPORT   Patient Name:   Margaret Sullivan Date of Exam: 07/12/2023 Medical Rec #:  [id number]      Height:       64.0 in Accession #:    [id number]     Weight:       152.1 lb Date of Birth:  11/09/1945     BSA:          1.742 m Patient Age:    77 years       BP:           180/87 mmHg Patient Gender: F              HR:           86 bpm. Exam Location:  Inpatient Procedure: 2D Echo, Cardiac Doppler and Color Doppler (Both Spectral and Color            Flow Doppler were utilized during procedure). Indications:    TIA  History:        Patient has no prior history of Echocardiogram examinations.                 Risk Factors:Dyslipidemia.  Sonographer:    Juanita Shaw Referring Phys: [id number] ERIN C LEHNER IMPRESSIONS  1. Left ventricular ejection fraction, by estimation, is 60 to 65%. The left ventricle has normal function. The left ventricle has no regional wall motion abnormalities. Left ventricular diastolic parameters are consistent with Grade I diastolic dysfunction (impaired relaxation).  2. Right ventricular systolic function is normal. The right ventricular size is normal.  3. The mitral valve is abnormal. Trivial mitral valve regurgitation. No evidence of mitral stenosis.  4. The aortic valve was not well  visualized. There is mild calcification of the aortic valve. Aortic valve regurgitation is not visualized. Aortic valve sclerosis is present, with no evidence of aortic valve stenosis.  5. The inferior vena cava is normal in size with greater than 50% respiratory variability, suggesting right atrial pressure of 3 mmHg. FINDINGS  Left Ventricle: Left ventricular ejection fraction, by estimation, is 60 to 65%. The left ventricle has normal function. The left ventricle has no regional wall motion abnormalities. Strain was performed and the global longitudinal strain is indeterminate. The left ventricular internal cavity size was normal in size. There is no left ventricular hypertrophy. Left ventricular diastolic parameters are consistent with Grade I diastolic dysfunction (impaired relaxation). Right Ventricle: The right ventricular size is normal. No increase in right ventricular wall thickness. Right ventricular systolic function is normal. Left Atrium: Left atrial size was normal in size. Right Atrium: Right atrial size was normal in size. Pericardium: There is no evidence of pericardial effusion. Mitral Valve: The mitral valve is abnormal. There is mild thickening of the mitral valve leaflet(s). There is mild calcification of the mitral valve leaflet(s). Trivial mitral valve regurgitation. No evidence of mitral valve stenosis. MV peak gradient, 4.8 mmHg. The mean mitral valve gradient is 2.0 mmHg. Tricuspid Valve: The tricuspid valve is normal in structure. Tricuspid valve regurgitation is not demonstrated. No evidence of tricuspid stenosis. Aortic Valve: The aortic valve was not well visualized. There is mild calcification of the aortic valve. Aortic valve regurgitation is not visualized. Aortic valve sclerosis is present, with no evidence of aortic valve stenosis. Aortic valve mean gradient measures 3.0 mmHg. Aortic valve peak gradient measures 5.5 mmHg. Aortic valve area, by VTI measures 1.79 cm. Pulmonic  Valve: The pulmonic valve was normal in structure. Pulmonic valve regurgitation is not visualized. No evidence of pulmonic stenosis. Aorta: The aortic root is normal in size and structure. Venous: The inferior vena cava is normal in size with greater than 50% respiratory variability, suggesting right atrial pressure of 3 mmHg. IAS/Shunts: No atrial level shunt detected by color flow Doppler. Additional Comments: 3D was performed not requiring image post processing on an independent workstation and was indeterminate.  LEFT VENTRICLE PLAX 2D LVIDd:         3.70 cm     Diastology LVIDs:         2.60 cm     LV e' medial:    4.35 cm/s LV PW:         1.00 cm     LV E/e' medial:  15.8 LV IVS:        1.00 cm     LV e' lateral:   7.40 cm/s LVOT diam:     1.70 cm     LV E/e' lateral: 9.3  LV SV:         42 LV SV Index:   24 LVOT Area:     2.27 cm  LV Volumes (MOD) LV vol d, MOD A2C: 84.4 ml LV vol d, MOD A4C: 92.9 ml LV vol s, MOD A2C: 34.4 ml LV vol s, MOD A4C: 22.5 ml LV SV MOD A2C:     50.0 ml LV SV MOD A4C:     92.9 ml LV SV MOD BP:      61.1 ml RIGHT VENTRICLE             IVC RV Basal diam:  2.30 cm     IVC diam: 1.40 cm RV Mid diam:    1.60 cm RV S prime:     11.90 cm/s TAPSE (M-mode): 2.5 cm LEFT ATRIUM             Index        RIGHT ATRIUM          Index LA diam:        2.80 cm 1.61 cm/m   RA Area:     6.67 cm LA Vol (A2C):   45.1 ml 25.90 ml/m  RA Volume:   8.66 ml  4.97 ml/m LA Vol (A4C):   31.6 ml 18.14 ml/m LA Biplane Vol: 41.3 ml 23.71 ml/m  AORTIC VALVE                    PULMONIC VALVE AV Area (Vmax):    1.79 cm     PV Vmax:       0.74 m/s AV Area (Vmean):   1.56 cm     PV Peak grad:  2.2 mmHg AV Area (VTI):     1.79 cm AV Vmax:           117.00 cm/s AV Vmean:          84.400 cm/s AV VTI:            0.233 m AV Peak Grad:      5.5 mmHg AV Mean Grad:      3.0 mmHg LVOT Vmax:         92.30 cm/s LVOT Vmean:        58.000 cm/s LVOT VTI:          0.184 m LVOT/AV VTI ratio: 0.79  AORTA Ao Root diam: 2.50 cm  Ao Asc diam:  2.50 cm MITRAL VALVE MV Area (PHT): 4.08 cm     SHUNTS MV Area VTI:   2.73 cm     Systemic VTI:  0.18 m MV Peak grad:  4.8 mmHg     Systemic Diam: 1.70 cm MV Mean grad:  2.0 mmHg MV Vmax:       1.10 m/s MV Vmean:      70.8 cm/s MV Decel Time: 186 msec MV E velocity: 68.80 cm/s MV A velocity: 111.00 cm/s MV E/A ratio:  0.62 Janelle Mediate MD Electronically signed by Janelle Mediate MD Signature Date/Time: 07/12/2023/4:26:40 PM    Final     Vitals:   07/13/23 0735 07/13/23 0829 07/13/23 0832 07/13/23 1121  BP: (!) 164/77   (!) 149/57  Pulse: 80   69  Resp: 20 16 15 18   Temp: 98.4 F (36.9 C)   97.9 F (36.6 C)  TempSrc: Oral   Oral  SpO2: 98%   95%  Weight:      Height:         PHYSICAL EXAM General:  Alert, well-nourished, well-developed patient in no acute distress Psych:  Mood and affect appropriate for situation CV: Regular rate and rhythm on monitor Respiratory:  Regular, unlabored respirations on room air GI: Abdomen soft and nontender   NEURO:  Mental Status: AA&Ox3, patient is able to give clear and coherent history Speech/Language: speech is without dysarthria or aphasia.    Cranial Nerves:  II: PERRL. Visual fields full.  III, IV, VI: EOMI. Eyelids elevate symmetrically.  V: Sensation is intact to light touch and symmetrical to face.  VII: Face is symmetrical resting and smiling VIII: hearing intact to voice. IX, X: Phonation is normal.  UJ:[id number] shrug 5/5. XII: tongue is midline without fasciculations. Motor: 5/5 strength to all muscle groups tested.  Tone: is normal and bulk is normal Sensation- Intact to light touch bilaterally.  Coordination: FTN intact bilaterally with mild intention tremor on the left Gait- deferred   ASSESSMENT/PLAN  Ms. Margaret Sullivan is a 78 y.o. female with history of hypertension, hyperlipidemia and ocular migraines admitted for aphasia and vision changes.  Patient states that she first had her typical ocular migraine  vision changes with her vision growing dark and seeing some "electrical" spots in her vision, but she did not have a headache at that time.  She returned home and waited for the visual symptoms to pass.  She attempted to call her doctor to discuss the symptoms but was having difficulty getting her words out while she was on the phone and while talking to her brother at home.  Her brother then called EMS, and she was brought to the emergency department.  Symptoms resolved prior to arrival.  MRI was negative for stroke.  NIH on Admission 0  TIA: Left-sided TIA Code Stroke CT head No acute abnormality.  Left choroidal fissure cyst CTA head & neck pending (ok to obtain scan with current labs, creatinine normal) MRI no acute abnormality, mild chronic microvascular ischemic disease 2D Echo EF 60 to 65%, grade 1 diastolic dysfunction, trivial mitral valve regurgitation, normal left atrial size, no atrial level shunt LDL 141 HgbA1c 5.1 VTE prophylaxis -Lovenox No antithrombotic prior to admission, now on clopidogrel 75 mg daily  Therapy recommendations:  No follow up needed  Disposition: Home  Hypertension Home meds: None Stable Maintain normotension  Hyperlipidemia Home meds: None LDL 141, goal < 70 Discussed need for medication therapy to lower LDL with patient.  Patient states that she does not wish to try a statin but that she may consider ezetimibe  as well as diet changes.  Information on ezetimibe  provided in discharge instructions, and patient states that she will discuss this and diet changes with her primary care provider upon discharge  Other Stroke Risk Factors Migraines   Other Active Problems None  Hospital day # 0  Patient seen by NP and then by MD, MD to edit note as needed. Cortney E Bucky Cardinal , MSN, AGACNP-BC [facility] See Amion for schedule and pager information 07/13/2023 2:09 PM    ATTENDING ATTESTATION:  78 year old with symptoms of aphasia  resolved in ED.  MRI negative.  Likely TIA.  LDL elevated she is hesitant to start statin May consider Zetia .  Echo is negative.  CTA pending if this is not able to be completed today can do outpatient as to not delay discharge.  Discussed with primary. DAPT therapy as above.  Dr. Dahlia Dross evaluated pt independently, reviewed imaging, chart, labs. Discussed and formulated plan with the Resident/APP. Changes were made to the  note where appropriate. Please see APP/resident note above for details.   Total 36 minutes spent on counseling patient and coordinating care, writing notes and reviewing chart.    Ulices Maack,MD   To contact Stroke Continuity provider, please refer to [email or web address]. After hours, contact General Neurology

## 2023-07-16 ENCOUNTER — Ambulatory Visit: Admitting: Family

## 2023-07-16 ENCOUNTER — Encounter: Payer: Self-pay | Admitting: Family

## 2023-07-16 ENCOUNTER — Telehealth: Payer: Self-pay

## 2023-07-16 VITALS — BP 160/74 | HR 80 | Temp 98.2°F | Ht 64.0 in | Wt 148.0 lb

## 2023-07-16 DIAGNOSIS — H9190 Unspecified hearing loss, unspecified ear: Secondary | ICD-10-CM | POA: Insufficient documentation

## 2023-07-16 DIAGNOSIS — E782 Mixed hyperlipidemia: Secondary | ICD-10-CM | POA: Diagnosis not present

## 2023-07-16 DIAGNOSIS — H918X1 Other specified hearing loss, right ear: Secondary | ICD-10-CM

## 2023-07-16 DIAGNOSIS — G459 Transient cerebral ischemic attack, unspecified: Secondary | ICD-10-CM | POA: Diagnosis not present

## 2023-07-16 DIAGNOSIS — I700 Atherosclerosis of aorta: Secondary | ICD-10-CM | POA: Insufficient documentation

## 2023-07-16 DIAGNOSIS — R0989 Other specified symptoms and signs involving the circulatory and respiratory systems: Secondary | ICD-10-CM | POA: Diagnosis not present

## 2023-07-16 MED ORDER — OLMESARTAN MEDOXOMIL 20 MG PO TABS: 10.0000 mg | ORAL_TABLET | Freq: Every day | ORAL | 1 refills | Status: DC

## 2023-07-16 NOTE — Patient Instructions (Addendum)
 It was very nice to see you today!   I have sent over the Olmesartan  to start for your blood pressure. Best time to take is when you can remember! Ok to take with the Plavix. I have attached more information regarding TIAs. Keep track of your home readings and bring this record with you to your next visit.   Ok to start the nasal sprays with just 1 spray each nostril twice a day. Use the Fluticasone  first, then the Azelastine  spray right after. Do this for 3 days and if feeling ok, no side effects increase the dose to 2 sprays twice a day as Dr Lydia Sams prescribed.  Please schedule a one month follow up visit with me today.      PLEASE NOTE:  If you had any lab tests please let us  know if you have not heard back within a few days. You may see your results on MyChart before we have a chance to review them but we will give you a call once they are reviewed by us . If we ordered any referrals today, please let us  know if you have not heard from their office within the next week.

## 2023-07-16 NOTE — Assessment & Plan Note (Signed)
 Atherosclerosis in the aortic artery. Discussed importance of cholesterol management and BP control. Deferred statin therapy until next visit.

## 2023-07-16 NOTE — Assessment & Plan Note (Signed)
 Significant hearing loss in right ear with unclear etiology. MRI planned, but recent head CT scan negative. Nasal sprays prescribed for potential fluid-related issues. - Start Flonase  and azelastine  nasal sprays, one spray each nostril twice a day for 3 days, then increase to 2 sprays bid as originally ordered by Dr Lydia Sams. - Perform Eustachian tube exercises to help open tubes and relieve pressure. - F/U with ENT as planned.

## 2023-07-16 NOTE — Telephone Encounter (Signed)
-----   Message from [place] sent at 07/16/2023 12:09 PM EDT ----- Regarding: medication prescribed Please call Margaret Sullivan and let know the Olmesartan  I prescribed does not come in 10mg , so she will need to split the 20mg  in half using a pill splitter. I asked the pharmacy to do ahead of time, but not all will do this. Thx

## 2023-07-16 NOTE — Telephone Encounter (Signed)
 Patient is calling staying she has the correct dosage in her medication

## 2023-07-16 NOTE — Progress Notes (Signed)
 Patient ID: Margaret Sullivan, female    DOB: [birth year], 78 y.o.   MRN: [id number]  Chief Complaint  Patient presents with   Follow-up    Pt was seen on 5/9 in ED for TIA.   Discussed the use of AI scribe software for clinical note transcription with the patient, who gave verbal consent to proceed.  History of Present Illness Margaret Sullivan is a 78 year old female with a history of transient ischemic attack who presents for follow-up after an ED visit.  She experienced episodes of vision loss and difficulty articulating her thoughts, consistent with a transient ischemic attack. Imaging done in ED were negative. Started Plavix and she expresses concern about taking this and other medications due to potential side effects, preferring lifestyle changes for health management.  Her blood pressure was significantly elevated in the ED and remains high. She has been on losartan  and metoprolol  previously but has not taken them consistently due to concerns about side effects and efficacy. She recalls low potassium levels, but unsure if d/t diuretic use.  She has significant hearing loss since February 22, confirmed by an audiologist, with an MRI planned to rule out a tumor. She has not started prescribed nasal sprays due to concerns about side effects while traveling. She reports a feeling of fullness in the head, possibly related to high blood pressure.  She notes a recent weight gain of ten pounds since her hearing loss began, attributing it to lifestyle and dietary changes. She is considering options for cholesterol management, including statins or ezetimibe , but is hesitant due to concerns about side effects.  Assessment & Plan Transient Ischemic Attack (TIA) Recent TIA with vision loss and speech difficulty, now resolved. Hypertension and hypercholesterolemia are significant risk factors. Plavix prescribed to reduce recurrence risk, advised pt this can be temporary. - Continue Plavix once daily. -  Ensure awareness of Plavix use during procedures, make all medical providers aware when seeing  Hypertension - Elevated blood pressure with previous medication concerns. Olmesartan  preferred for once-daily dosing, low SE profile. Emphasized lifestyle modifications. - Start olmesartan  10mg  (1/2 of 20mg ) once daily.  - Monitor blood pressure at home and record readings. - Follow up in one month to assess blood pressure control and medication tolerance.  Aortic Atherosclerosis - Atherosclerosis in the aortic artery. Discussed importance of cholesterol management and BP control. Deferred statin therapy until next visit.  Hearing loss Significant hearing loss in right ear with unclear etiology. MRI planned, but recent head CT scan negative. Nasal sprays prescribed for potential fluid-related issues. - Start Flonase  and azelastine  nasal sprays, one spray each nostril twice a day for 3 days, then increase to 2 sprays bid as originally ordered by Dr Lydia Sams. - Perform Eustachian tube exercises to help open tubes and relieve pressure. - F/U with ENT as planned.   Subjective:     Outpatient Medications Prior to Visit  Medication Sig Dispense Refill   Ascorbic Acid (VITAMIN C) 500 MG CAPS Take 500 mg by mouth daily.     betamethasone dipropionate (DIPROLENE) 0.05 % ointment Apply 1 Application topically as needed (outbreaks).     Bevacizumab  (AVASTIN  IV) Inject into the vein. Every 12 weeks eye injection - left eye     Cholecalciferol (D3 PO) Take 1 capsule by mouth daily.     clopidogrel (PLAVIX) 75 MG tablet Take 1 tablet (75 mg total) by mouth daily. 30 tablet 2   PRESCRIPTION MEDICATION Place 4 drops into the left eye  daily. Unknown eye drop after - eye injection only for 2 days until next injection     No facility-administered medications prior to visit.   Past Medical History:  Diagnosis Date   Abnormal glucose    Allergy    Essential tremor    GERD (gastroesophageal reflux disease)     Heart murmur    High cholesterol    Hyperlipidemia    Hypertension    labile   Labile hypertension    Medication management 06/10/2013   Prediabetes    Past Surgical History:  Procedure Laterality Date   BACK SURGERY  2001   COLONOSCOPY     TONSILLECTOMY AND ADENOIDECTOMY Bilateral 1950   UPPER GASTROINTESTINAL ENDOSCOPY     WISDOM TOOTH EXTRACTION Bilateral 1969   Allergies  Allergen Reactions   Ampicillin Hives and Swelling   Aspirin Itching    Spoke with patient and she endorsed itching on the top of her head when she took ASA awhile back. Allergy was listed in 2015.   Doxycycline  Nausea And Vomiting   Keflex [Cephalexin] Hives    Itching   Macrodantin [Nitrofurantoin Macrocrystal] Hives, Itching and Swelling   Percocet [Oxycodone-Acetaminophen ]    Propulsid [Cisapride] Itching and Swelling   Voltaren [Diclofenac] Hives and Itching   Codeine Palpitations   Penicillins Swelling and Rash      Objective:    Physical Exam Vitals and nursing note reviewed.  Constitutional:      Appearance: Normal appearance.  Cardiovascular:     Rate and Rhythm: Normal rate and regular rhythm.  Pulmonary:     Effort: Pulmonary effort is normal.     Breath sounds: Normal breath sounds.  Musculoskeletal:        General: Normal range of motion.  Skin:    General: Skin is warm and dry.  Neurological:     Mental Status: She is alert.  Psychiatric:        Mood and Affect: Mood normal.        Behavior: Behavior normal.   BP (!) 160/74 (BP Location: Left Arm, Patient Position: Sitting, Cuff Size: Large)   Pulse 80   Temp 98.2 F (36.8 C) (Temporal)   Ht 5\' 4"  (1.626 m)   Wt 148 lb (67.1 kg)   SpO2 97%   BMI 25.40 kg/m  Wt Readings from Last 3 Encounters:  07/16/23 148 lb (67.1 kg)  07/12/23 147 lb 0.8 oz (66.7 kg)  07/09/23 149 lb (67.6 kg)   *Extra time ( ) spent with patient today which consisted of chart review, discussing diagnoses, work up, treatment, answering many  questions, and documentation.   Versa Gore, NP

## 2023-07-16 NOTE — Assessment & Plan Note (Signed)
 Elevated blood pressure with previous medication concerns. Olmesartan  preferred for once-daily dosing, low SE profile. Emphasized lifestyle modifications. - Start olmesartan  10mg  (1/2 of 20mg ) once daily.  - Monitor blood pressure at home and record readings. - Follow up in one month to assess blood pressure control and medication tolerance.

## 2023-07-16 NOTE — Telephone Encounter (Signed)
 I reached out to pt and lvm in regards to recommendations.

## 2023-07-16 NOTE — Assessment & Plan Note (Signed)
 Recent TIA with vision loss and speech difficulty, now resolved, seen in ED. Hypertension and hypercholesterolemia are significant risk factors. Plavix prescribed to reduce recurrence risk, advised pt this can be temporary. - Continue Plavix once daily. - Ensure awareness of Plavix use during procedures, make all medical providers aware when seeing

## 2023-07-17 ENCOUNTER — Encounter: Payer: Self-pay | Admitting: Audiology

## 2023-07-24 ENCOUNTER — Encounter (INDEPENDENT_AMBULATORY_CARE_PROVIDER_SITE_OTHER): Payer: Medicare PPO | Admitting: Ophthalmology

## 2023-07-30 ENCOUNTER — Encounter (INDEPENDENT_AMBULATORY_CARE_PROVIDER_SITE_OTHER): Admitting: Ophthalmology

## 2023-07-30 DIAGNOSIS — H353221 Exudative age-related macular degeneration, left eye, with active choroidal neovascularization: Secondary | ICD-10-CM | POA: Diagnosis not present

## 2023-07-30 DIAGNOSIS — H43813 Vitreous degeneration, bilateral: Secondary | ICD-10-CM | POA: Diagnosis not present

## 2023-07-30 DIAGNOSIS — I10 Essential (primary) hypertension: Secondary | ICD-10-CM | POA: Diagnosis not present

## 2023-07-30 DIAGNOSIS — H35033 Hypertensive retinopathy, bilateral: Secondary | ICD-10-CM | POA: Diagnosis not present

## 2023-07-30 DIAGNOSIS — H2513 Age-related nuclear cataract, bilateral: Secondary | ICD-10-CM

## 2023-08-02 ENCOUNTER — Telehealth (INDEPENDENT_AMBULATORY_CARE_PROVIDER_SITE_OTHER): Payer: Self-pay

## 2023-08-02 NOTE — Telephone Encounter (Signed)
 Patient called and left a message stating she had some questions, she had a TIA May 9th and had MRI, she is requesting a call [phone]

## 2023-08-02 NOTE — Telephone Encounter (Signed)
 I spoke with patient and she informed be that she had a recent trip to the ER because she thought that she was having a stroke.  While she was there they did a MR BRAIN/IAC WO CONTRAST, a CT ANGIO HEAD NECK W WO CM, and a CT HEAD CODE STROKE WO CONTRAST. Patient wants to know if she still needs to get the MR BRAIN/IAC W WO CONTRAST for Dr. Lydia Sams. Patient also wanted to know if she needed to continue to use her astelin  and flonase  until her visit with Dr. Lydia Sams. I told the patient that someone would be giving her a call back.

## 2023-08-02 NOTE — Telephone Encounter (Signed)
 Does not need MRI Brain IAC. Continue sprays. Thanks, would you mind letting herknow?

## 2023-08-05 NOTE — Telephone Encounter (Signed)
 Informed patient of providers receommendations. Patient verbalizes understanding.

## 2023-08-13 ENCOUNTER — Ambulatory Visit: Admitting: Family

## 2023-08-13 VITALS — BP 116/60 | HR 77 | Temp 97.9°F | Ht 64.0 in | Wt 147.2 lb

## 2023-08-13 DIAGNOSIS — E782 Mixed hyperlipidemia: Secondary | ICD-10-CM

## 2023-08-13 DIAGNOSIS — R0989 Other specified symptoms and signs involving the circulatory and respiratory systems: Secondary | ICD-10-CM | POA: Diagnosis not present

## 2023-08-13 DIAGNOSIS — G459 Transient cerebral ischemic attack, unspecified: Secondary | ICD-10-CM | POA: Diagnosis not present

## 2023-08-13 MED ORDER — OLMESARTAN MEDOXOMIL 20 MG PO TABS: 10.0000 mg | ORAL_TABLET | Freq: Every day | ORAL | 0 refills | Status: DC

## 2023-08-13 NOTE — Assessment & Plan Note (Signed)
 30d post TIA, still taking Plavix , no new neurological events. Future cerebrovascular event risk ~6% over 5 years.  - Continue Plavix . - Refer to neurology for further guidance on need for continued Plavix , pt has allergy to ASA. - Reassured about low future event risk, but needs to be sure HTN and high cholesterol are under control. - F/U in 3 mos

## 2023-08-13 NOTE — Assessment & Plan Note (Signed)
 Inconsistent home blood pressure readings, systolic often in 140s, diastolic <90. BP good today. On olmesartan  10mg  qevening, occasional queasiness reported, but unsure if r/t med or something else. Advised med has very low SE profile for nausea.  - Continue taking olmesartan  with food. - Monitor blood pressure at home, notify office of continued high readings >140/90. - F/U in 3 mos

## 2023-08-13 NOTE — Progress Notes (Signed)
 Patient ID: Margaret Sullivan, female    DOB: May 30, 1945, 78 y.o.   MRN: [id number]  Chief Complaint  Patient presents with   Hypertension    Follow up on meds that were started with. No side effects, states things feel okay. States tiredness. States she will get quizyness once a day and states it's not related to medications.   Discussed the use of AI scribe software for clinical note transcription with the patient, who gave verbal consent to proceed.  History of Present Illness Margaret Sullivan is a 78 year old female with hypertension and a history of TIA who presents for follow-up on blood pressure management and recent symptoms.  She experiences inconsistent blood pressure readings at home, with systolic values typically in the 140s and occasional readings in the 120s. Diastolic readings remain below 90, often around 70. She takes olmesartan  at 7 PM. She experiences occasional queasiness and a sensation of possibly passing out, which she manages by drinking water. These symptoms are not associated with her medication timing. She also experiences abdominal discomfort and bloating, which she associates with elevated liver enzymes. She has been unable to maintain her usual exercise and dietary routines. She has a history of TIA with normal MRIs, CTs, an angiogram, and an EKG. No new neurological events have occurred. She is concerned about her cholesterol levels and is considering dietary and exercise modifications. But admits to not always eating healthy diet including fried foods, take out food. Last lipid panel as follows: Lab Results  Component Value Date   CHOL 206 (H) 07/13/2023   HDL 28 (L) 07/13/2023   LDLCALC 141 (H) 07/13/2023   TRIG 183 (H) 07/13/2023   CHOLHDL 7.4 07/13/2023  The 10-year ASCVD risk score (Arnett DK, et al., 2019) is: 20.3%   Values used to calculate the score:     Age: 62 years     Sex: Female     Is Non-Hispanic African American: No     Diabetic: No      Tobacco smoker: No     Systolic Blood Pressure: 116 mmHg     Is BP treated: Yes     HDL Cholesterol: 28 mg/dL     Total Cholesterol: 206 mg/dL  Assessment & Plan Transient Ischemic Attack (TIA) 30d post TIA, still taking Plavix , no new neurological events. Future cerebrovascular event risk ~6% over 5 years.  - Continue Plavix . - Refer to neurology for further guidance on need for continued Plavix , pt has allergy to ASA. - Reassured about low future event risk, but needs to be sure HTN and high cholesterol are under control. - F/U in 3 mos  Hypertension Inconsistent home blood pressure readings, systolic often in 140s, diastolic <90. On olmesartan  10mg  qevening, occasional queasiness reported, but unsure if r/t med or something else. Advised med has very low SE profile for nausea.  - Continue taking olmesartan  with food, sending refill. - Monitor blood pressure at home, notify office of continued high readings >140/90. - F/U in 3 mos  Hyperlipidemia Total cholesterol 206 mg/dL, LDL 846, HDL 28; not on statin, prefers dietary/exercise management, considering fish oil/krill oil for HDL as not eating as much fatty fish as used to. Aware of ASCVD risk, resistant to starting statin. Discussed benefits of taking 1-2x/week. - Encourage dietary modifications to reduce saturated fat. - Recommend regular exercise. - Consider fish oil or krill oil supplements, look for at organic food store like [employer] or [employer]. - F/U in 3 mos  with fasting labs   Subjective:     Outpatient Medications Prior to Visit  Medication Sig Dispense Refill   Ascorbic Acid  (VITAMIN C ) 500 MG CAPS Take 500 mg by mouth daily.     betamethasone dipropionate (DIPROLENE) 0.05 % ointment Apply 1 Application topically as needed (outbreaks).     Bevacizumab  (AVASTIN  IV) Inject into the vein. Every 16 weeks eye injection - left eye   Changed from 12 weeks per patient.     Cholecalciferol (D3 PO) Take 1 capsule by mouth  daily.     clopidogrel  (PLAVIX ) 75 MG tablet Take 1 tablet (75 mg total) by mouth daily. 30 tablet 2   olmesartan  (BENICAR ) 20 MG tablet Take 0.5 tablets (10 mg total) by mouth daily. 30 tablet 1   trimethoprim-polymyxin b (POLYTRIM) ophthalmic solution Place 4 drops into the left eye every 4 (four) hours. One drop into Left eye 4 times a day.     azelastine  (ASTELIN ) 0.1 % nasal spray Place into both nostrils 2 (two) times daily. Use in each nostril as directed (Patient not taking: Reported on 08/13/2023)     fluticasone  (FLONASE ) 50 MCG/ACT nasal spray Place into both nostrils daily. (Patient not taking: Reported on 08/13/2023)     PRESCRIPTION MEDICATION Place 4 drops into the left eye daily. Unknown eye drop after - eye injection only for 2 days until next injection (Patient not taking: Reported on 08/13/2023)     No facility-administered medications prior to visit.   Past Medical History:  Diagnosis Date   Abnormal glucose    Allergy    Essential tremor    GERD (gastroesophageal reflux disease)    Heart murmur    High cholesterol    Hyperlipidemia    Hypertension    labile   Labile hypertension    Medication management 06/10/2013   Prediabetes    Past Surgical History:  Procedure Laterality Date   BACK SURGERY  2001   COLONOSCOPY     TONSILLECTOMY AND ADENOIDECTOMY Bilateral 1950   UPPER GASTROINTESTINAL ENDOSCOPY     WISDOM TOOTH EXTRACTION Bilateral 1969   Allergies  Allergen Reactions   Ampicillin Hives and Swelling   Aspirin Itching    Spoke with patient and she endorsed itching on the top of her head when she took ASA awhile back. Allergy was listed in 2015.   Doxycycline  Nausea And Vomiting   Keflex [Cephalexin] Hives    Itching   Macrodantin [Nitrofurantoin Macrocrystal] Hives, Itching and Swelling   Percocet [Oxycodone-Acetaminophen ]    Propulsid [Cisapride] Itching and Swelling   Voltaren [Diclofenac] Hives and Itching   Codeine Palpitations   Penicillins  Swelling and Rash      Objective:    Physical Exam Vitals and nursing note reviewed.  Constitutional:      Appearance: Normal appearance.  Cardiovascular:     Rate and Rhythm: Normal rate and regular rhythm.  Pulmonary:     Effort: Pulmonary effort is normal.     Breath sounds: Normal breath sounds.  Musculoskeletal:        General: Normal range of motion.  Skin:    General: Skin is warm and dry.  Neurological:     Mental Status: She is alert.  Psychiatric:        Mood and Affect: Mood normal.        Behavior: Behavior normal.    BP 116/60   Pulse 77   Temp 97.9 F (36.6 C) (Temporal)   Ht 5\' 4"  (1.626  m)   Wt 147 lb 3.2 oz (66.8 kg)   SpO2 97%   BMI 25.27 kg/m  Wt Readings from Last 3 Encounters:  08/13/23 147 lb 3.2 oz (66.8 kg)  07/16/23 148 lb (67.1 kg)  07/12/23 147 lb 0.8 oz (66.7 kg)   *Extra time ( ) spent with patient today which consisted of chart review, discussing diagnoses, work up, treatment, answering questions, and documentation.     Versa Gore, NP

## 2023-08-13 NOTE — Assessment & Plan Note (Signed)
 Total cholesterol 206 mg/dL, LDL 161, HDL 28; not on statin, prefers dietary/exercise management, considering fish oil/krill oil for HDL as not eating as much fatty fish as used to. Aware of ASCVD risk, resistant to starting statin. Discussed benefits of taking 1-2x/week. - Encourage dietary modifications to reduce saturated fat. - Recommend regular exercise. - Consider fish oil or krill oil supplements, look for at organic food store like [employer] or [employer]. - F/U in 3 mos with fasting labs

## 2023-08-22 ENCOUNTER — Other Ambulatory Visit (HOSPITAL_BASED_OUTPATIENT_CLINIC_OR_DEPARTMENT_OTHER): Payer: Self-pay

## 2023-08-22 ENCOUNTER — Encounter: Payer: Self-pay | Admitting: Family

## 2023-08-22 ENCOUNTER — Ambulatory Visit: Payer: Self-pay

## 2023-08-22 ENCOUNTER — Ambulatory Visit: Admitting: Family

## 2023-08-22 VITALS — BP 118/70 | HR 79 | Temp 97.7°F | Ht 64.0 in | Wt 148.6 lb

## 2023-08-22 DIAGNOSIS — R21 Rash and other nonspecific skin eruption: Secondary | ICD-10-CM | POA: Diagnosis not present

## 2023-08-22 DIAGNOSIS — R0989 Other specified symptoms and signs involving the circulatory and respiratory systems: Secondary | ICD-10-CM

## 2023-08-22 MED ORDER — HYDROXYZINE HCL 10 MG PO TABS
10.0000 mg | ORAL_TABLET | Freq: Three times a day (TID) | ORAL | 0 refills | Status: DC | PRN
  Filled 2023-08-22: qty 30, 10d supply, fill #0

## 2023-08-22 NOTE — Assessment & Plan Note (Signed)
 Taking Olmesartan  10mg  qd. List of home readings all elevated between 140-178/60-90. States her older machine seems to read more closely to our readings. Readings are good in the office. Advised to bring in home monitor machine next visit so we can compare. - Continue checking BP at home using old machine that measured more closely to our readings. - If BP still running >140/90 either number, go ahead and increase dose to 1/2 pill twice a day and call the office to let me know. - F/U in 2 mos

## 2023-08-22 NOTE — Telephone Encounter (Signed)
 FYI Only or Action Required?: FYI only for provider.  Patient was last seen in primary care on 08/13/2023 by Versa Gore, NP. Called Nurse Triage reporting itching. Symptoms began several weeks ago. Interventions attempted: Nothing. Symptoms are: unchanged.  Triage Disposition: See Physician Within 24 Hours  Patient/caregiver understands and will follow disposition?: Yes     Copied from CRM [id number]. Topic: Clinical - Red Word Triage >> Aug 22, 2023  8:53 AM Adonis Hoot wrote: Red Word that prompted transfer to Nurse Triage: reaction to a medication,intense itching all over body Reason for Disposition  [1] MODERATE-SEVERE widespread itching (i.e., interferes with sleep, normal activities or school) AND [2] not improved after 24 hours of itching Care Advice  Answer Assessment - Initial Assessment Questions 1. DESCRIPTION: Describe the itching you are having.     Itching started on 08/14/23 and has become worse.  States pink area with heat in it. 2. SEVERITY: How bad is it?    - MILD: Doesn't interfere with normal activities.   - MODERATE-SEVERE: Interferes with work, school, sleep, or other activities.      severe 3. SCRATCHING: Are there any scratch marks? Bleeding?     yes 4. ONSET: When did this begin?      08/14/23 5. CAUSE: What do you think is causing the itching? (ask about swimming pools, pollen, animals, soaps, etc.)     denies 6. OTHER SYMPTOMS: Do you have any other symptoms?      Itching all over body, dizziness 7. PREGNANCY: Is there any chance you are pregnant? When was your last menstrual period?     na  Protocols used: Itching - Widespread-A-AH

## 2023-08-22 NOTE — Progress Notes (Signed)
 Patient ID: Margaret Sullivan, female    DOB: [birth year], 78 y.o.   MRN: [id number]  Chief Complaint  Patient presents with   Acute Visit    Having itching all over ,started last Friday or 08/14/23 Wednesday, having rash and hot to touch, not taking medication to help.  Discussed the use of AI scribe software for clinical note transcription with the patient, who gave verbal consent to proceed.  History of Present Illness Margaret Sullivan is a 78 year old female with eczema and allergies who presents with a rash after taking a Z-Pak.  She developed a rash after completing a course of azithromycin  prescribed following a dental extraction on June 6. The rash intensified by June 13, affecting her armpits, under her breasts, and waist. It is pink, hot, and itchy, spreading from her ankles upwards, resembling a yeast infection. She has a history of urticaria with wheals. Itching occurs in her eyes and hands, initially thought to be due to eczema. Lotions and creams have been used for dryness and itching, but symptoms persist. The rash did not occur with a previous Z-Pak in February.  She is currently taking Plavix  and olmesartan , started on May 13. She experiences liver pain and is concerned about medication overload. Known allergies to various medications, including antibiotics, limit her options. She reports bleeding from scratching the rash and dizziness in the mornings, attributed to not eating breakfast. She manages symptoms with hydration, dietary adjustments, betamethasone cream for eczema, and hydroxyzine for itching and sleep.  Assessment & Plan Skin rash - Rash likely related to Plavix , less likely olmesartan  or Azithromycin  based on timing of rash and when medications were started. Also has allergy to ASA.  Eczema and allergies may complicate presentation. Urticaria unlikely. - Prescribed hydroxyzine 10mg  for pruritus, 1-2 pills for sleep, 1 pill tid prn. - Advised topical corticosteroids (has  betamethasone from home) for pruritus and inflammation, apply thin layer twice daily. - Stop Plavix . Has taken daily since 5/10 with no new TIA symptoms. Phone # provided for Neurology office as she has not received a call as of yet.  - Monitor rash and report if symptoms do not improve or worsen.   Hypertension - Taking Olmesartan  10mg  qd. List of home readings all elevated between 140-178/60-90. States her older machine seems to read more closely to our readings. Readings are good in the office. Advised to bring in home monitor machine next visit so we can compare. - Continue checking BP at home using old machine that measured more closely to our readings. - If BP still running >140/90 either number, go ahead and increase dose to 1/2 pill twice a day and call the office to let me know. - F/U in 2 mos     Subjective:    Outpatient Medications Prior to Visit  Medication Sig Dispense Refill   Ascorbic Acid  (VITAMIN C ) 500 MG CAPS Take 500 mg by mouth daily.     betamethasone dipropionate (DIPROLENE) 0.05 % ointment Apply 1 Application topically as needed (outbreaks).     Bevacizumab  (AVASTIN  IV) Inject into the vein. Every 16 weeks eye injection - left eye   Changed from 12 weeks per patient.     clopidogrel  (PLAVIX ) 75 MG tablet Take 1 tablet (75 mg total) by mouth daily. 30 tablet 2   olmesartan  (BENICAR ) 20 MG tablet Take 0.5 tablets (10 mg total) by mouth daily. 90 tablet 0   trimethoprim-polymyxin b (POLYTRIM) ophthalmic solution Place 4 drops into the  left eye every 4 (four) hours. One drop into Left eye 4 times a day.     Cholecalciferol (D3 PO) Take 1 capsule by mouth daily. (Patient not taking: Reported on 08/22/2023)     No facility-administered medications prior to visit.   Past Medical History:  Diagnosis Date   Abnormal glucose    Allergy    Essential tremor    GERD (gastroesophageal reflux disease)    Heart murmur    High cholesterol    Hyperlipidemia    Hypertension     labile   Labile hypertension    Medication management 06/10/2013   Prediabetes    Past Surgical History:  Procedure Laterality Date   BACK SURGERY  2001   COLONOSCOPY     TONSILLECTOMY AND ADENOIDECTOMY Bilateral 1950   UPPER GASTROINTESTINAL ENDOSCOPY     WISDOM TOOTH EXTRACTION Bilateral 1969   Allergies  Allergen Reactions   Ampicillin Hives and Swelling   Aspirin Itching    Spoke with patient and she endorsed itching on the top of her head when she took ASA awhile back. Allergy was listed in 2015.   Doxycycline  Nausea And Vomiting   Keflex [Cephalexin] Hives    Itching   Macrodantin [Nitrofurantoin Macrocrystal] Hives, Itching and Swelling   Percocet [Oxycodone-Acetaminophen ]    Propulsid [Cisapride] Itching and Swelling   Voltaren [Diclofenac] Hives and Itching   Codeine Palpitations   Penicillins Swelling and Rash      Objective:    Physical Exam Vitals and nursing note reviewed.  Constitutional:      Appearance: Normal appearance.   Cardiovascular:     Rate and Rhythm: Normal rate and regular rhythm.  Pulmonary:     Effort: Pulmonary effort is normal.     Breath sounds: Normal breath sounds.   Musculoskeletal:        General: Normal range of motion.   Skin:    General: Skin is warm and dry.     Findings: Rash present.   Neurological:     Mental Status: She is alert.   Psychiatric:        Mood and Affect: Mood normal.        Behavior: Behavior normal.    BP 118/70 (BP Location: Left Arm, Patient Position: Sitting, Cuff Size: Normal)   Pulse 79   Temp 97.7 F (36.5 C)   Ht 5' 4 (1.626 m)   Wt 148 lb 9.6 oz (67.4 kg)   SpO2 98%   BMI 25.51 kg/m  Wt Readings from Last 3 Encounters:  08/22/23 148 lb 9.6 oz (67.4 kg)  08/13/23 147 lb 3.2 oz (66.8 kg)  07/16/23 148 lb (67.1 kg)      Versa Gore, NP

## 2023-08-22 NOTE — Patient Instructions (Signed)
 It was very nice to see you today!   I have sent in Hydroxyzine to take for your rash to help with the itching. This may cause drowsiness, ok to cut in half if needed. You can take 2 pills at night if needed for sleep. Ok to use your Betamethosone steroid cream on the itchy areas of your skin. STOP taking the Plavix  as I think this is the cause of your rash. Continue the Olmesartan  and let me know if the rash is not resolving.  Because your BP is running high at home, try increasing the dose to 1/2 pill in evenings as well as morning. Ok if taken at bedtime. But let me know if this causes any dizziness or lightheadedness!      PLEASE NOTE:  If you had any lab tests please let us  know if you have not heard back within a few days. You may see your results on MyChart before we have a chance to review them but we will give you a call once they are reviewed by us . If we ordered any referrals today, please let us  know if you have not heard from their office within the next week.

## 2023-08-22 NOTE — Telephone Encounter (Signed)
 Pt was seen in office for visit , no further action needed.

## 2023-09-09 ENCOUNTER — Other Ambulatory Visit: Payer: Self-pay

## 2023-09-12 ENCOUNTER — Encounter (INDEPENDENT_AMBULATORY_CARE_PROVIDER_SITE_OTHER): Payer: Self-pay | Admitting: Otolaryngology

## 2023-09-12 ENCOUNTER — Ambulatory Visit (INDEPENDENT_AMBULATORY_CARE_PROVIDER_SITE_OTHER): Admitting: Audiology

## 2023-09-12 ENCOUNTER — Other Ambulatory Visit: Payer: Self-pay | Admitting: Family

## 2023-09-12 ENCOUNTER — Ambulatory Visit (INDEPENDENT_AMBULATORY_CARE_PROVIDER_SITE_OTHER): Admitting: Otolaryngology

## 2023-09-12 VITALS — BP 150/83 | HR 74

## 2023-09-12 DIAGNOSIS — H903 Sensorineural hearing loss, bilateral: Secondary | ICD-10-CM | POA: Diagnosis not present

## 2023-09-12 DIAGNOSIS — H9121 Sudden idiopathic hearing loss, right ear: Secondary | ICD-10-CM

## 2023-09-12 DIAGNOSIS — H6991 Unspecified Eustachian tube disorder, right ear: Secondary | ICD-10-CM

## 2023-09-12 DIAGNOSIS — G459 Transient cerebral ischemic attack, unspecified: Secondary | ICD-10-CM

## 2023-09-12 NOTE — Patient Instructions (Signed)
 Use flonase  two sprays each nostril twice a day Still pop your ears a few times a day

## 2023-09-12 NOTE — Progress Notes (Signed)
 [address] [place], [place] [address] [phone]  Audiological Evaluation    Name: Margaret Sullivan     DOB:   10/27/1945      MRN:   [id number]                                                                                     Service Date: 09/12/2023     Accompanied by: unaccompanied    Patient comes today after Dr. Tobie, ENT sent a referral for a hearing evaluation due to concerns with hearing loss asymmetry.   Symptoms Yes Details  Hearing loss  [x]  Worse in the right ear - reports some fluctuation ( better in the mornings)  Previously: Reports that right side hearing went away on April 27, 2023- says she was feeling well at the moment. Reports cannot use the phone in the right ear and sounds seem robotic/distorted.   07-09-2023: Right ear- Normal to moderately severe sensorineural hearing loss from 250 Hz - 8000 Hz. Left ear-  Borderline normal from [id number] Hz, then mild to moderately severe sensorineural hearing loss from 4000-8000 Hz  Tinnitus  []    Ear pain/ infections/pressure  []    Balance problems  []    Noise exposure history  []    Previous ear surgeries  []    Family history of hearing loss  []    Amplification  []    Other  [x]  Continues to report right side sounds robotic, AI. Avastin  shot every for left eye rare condition. TIA this weekend.    Otoscopy: Right ear: Clear external ear canal and notable landmarks visualized on the tympanic membrane. Left ear:  Clear external ear canal and notable landmarks visualized on the tympanic membrane.  Tympanometry: Right ear: Type As- Normal external ear canal volume with normal middle ear pressure and low tympanic membrane compliance. Left ear: Type As- Normal external ear canal volume with normal middle ear pressure and low tympanic membrane compliance.  Pure tone Audiometry: Right ear- Mild to moderately severe sensorineural hearing loss from 250 Hz - 8000 Hz. Left ear-  Normal hearing from  [phone] Hz, then moderate to moderately severe sensorineural hearing loss from 6000 Hz - 80000 Hz. *Right hearing asymmetry continues to be observed.*  Speech Audiometry: Right ear- Speech Reception Threshold (SRT) was obtained at 45 dBHL, with contralateral masking. Left ear-Speech Reception Threshold (SRT) was obtained at 15 dBHL.   Word Recognition Score Tested using NU-6 (recorded) Right ear: 92% was obtained at a presentation level of 85 dBHL with contralateral masking which is deemed as  excellent. Left ear: 92% was obtained at a presentation level of 70 dBHL with contralateral masking which is deemed as  excellent.   The hearing test results were completed under headphones and results are deemed to be of good reliability. Test technique:  conventional    Impression: Hearing asymmetry from 500-15000 Hz, worse in the right ear continues to be observed.    Recommendations: Follow up with ENT as scheduled for today. Return for a hearing evaluation if concerns with hearing changes arise or per MD recommendation. Consider a communication needs assessment after medical clearance for hearing aids is obtained.  Margaret Sullivan, AUD

## 2023-09-12 NOTE — Progress Notes (Signed)
 Dear Dr. Lucius, Here is my assessment for our mutual patient, Margaret Sullivan. Thank you for allowing me the opportunity to care for your patient. Please do not hesitate to contact me should you have any other questions. Sincerely, Dr. Eldora Blanch  Otolaryngology Clinic Note Referring provider: Dr. Lucius HPI:  Margaret Sullivan is a 78 y.o. female kindly referred by Dr. Lucius for evaluation of right ear trouble.  Initial visit (07/2023): Patient reports: start in late Feb 2025, and she reports that she suddenly had right sided sudden hearing loss. Has not returned since. No antecedent event or URI or trauma prior to this. She reports that since then, she had an unrelated episode a few days later of nasal congestion, right ear muffled hearing, and URI symptoms. She denied any ear pain or discomfort at that time. She currently reports some right > left ear congestion or fullness, which has been ongoing since then. She has gotten antibiotics, and two rounds of steroids which helped the ear pressure some but hearing has not changed. No loud noise exposure. No FHX of Hearing loss.  Not using any nasal sprays. No nasal congestion, PND or other nasal symptoms currently. She does have AR symptoms seasonally  Patient denies: ear pain, vertigo, drainage, tinnitus Patient additionally denies: deep pain in ear canal, eustachian tube symptoms such as popping/crackling Patient also denies barotrauma, vestibular suppressant use, ototoxic medication use Prior ear surgery: no No history of frequent ear infections (except as a child)  --------------------------------------------------------- 09/12/2023 She was recently diagnosed with a TIA and had an MRI and a CT. Had a recent dental extraction as well which she is recovering from.. She does feel like her ears feel less full, right one feels worse as day goes on. She did have a HT today. She tried the sprays, unsure if helped but after early June, did not use it  given dental work. She denies pain, drainage, tinnitus. Hearing is about the same.  ------------------------------------------------------------- H&N Surgery: no Personal or FHx of bleeding dz or anesthesia difficulty: no   GLP-1: no AP/AC: no  Tobacco: no  PMHx: HTN, GERD, Fatty Liver, HLD, TIA  Independent Review of Additional Tests or Records:  Kenney Roys (05/07/2023): noted nasal congesiton, right ear feeling muffled, running cough x3 days; recent Z-pak and pred; Dx: ETD; Rx: Pred 40 x7d CBC and CMP 11/01/2022: WBC 10.2, Hgb 13.5; Bun/Cr 15/0.73 07/2023 Audiogram was independently reviewed and interpreted by me and it reveals - borderline downsloping to mod severe SNHL on left; on right, borderline, then moderate severe SNHL mid-freq and then downsloping again at 2000 Hz; B/B tymps; WRT 84% and 88% AD and AS at 80dB and 70dB respectively  SNHL= Sensorineural hearing loss  09/2023 Audiogram was independently reviewed and interpreted by me and it reveals - essentially stable hearing; As/As tymps; WRT stable SNHL= Sensorineural hearing loss  MRI Brain 07/12/2023 independently interpreted with respect to ears: some right mastoid opacification with no obvious retrocochlear lesion noted; but suboptimal eval given no dedicated IAC Cuts [facility] 07/12/2023: independently interpreted with respect to ears: mastoid and ME clear; cuts thick but no ossicular chain or otic capsule pathology noted  PMH/Meds/All/SocHx/FamHx/ROS:   Past Medical History:  Diagnosis Date   Abnormal glucose    Allergy    Essential tremor    GERD (gastroesophageal reflux disease)    Heart murmur    High cholesterol    Hyperlipidemia    Hypertension    labile   Labile hypertension  Medication management 06/10/2013   Prediabetes      Past Surgical History:  Procedure Laterality Date   BACK SURGERY  2001   COLONOSCOPY     TONSILLECTOMY AND ADENOIDECTOMY Bilateral 1950   UPPER GASTROINTESTINAL ENDOSCOPY      WISDOM TOOTH EXTRACTION Bilateral 1969    Family History  Problem Relation Age of Onset   Lymphoma Mother        NHL   Heart attack Father    Breast cancer Father    Colon cancer Maternal Uncle    Esophageal cancer Neg Hx    Stomach cancer Neg Hx    Rectal cancer Neg Hx      Social Connections: Moderately Integrated (07/12/2023)   Social Connection and Isolation Panel    Frequency of Communication with Friends and Family: More than three times a week    Frequency of Social Gatherings with Friends and Family: More than three times a week    Attends Religious Services: More than 4 times per year    Active Member of Clubs or Organizations: Yes    Attends Engineer, structural: More than 4 times per year    Marital Status: Divorced      Current Outpatient Medications:    Ascorbic Acid  (VITAMIN C ) 500 MG CAPS, Take 500 mg by mouth daily., Disp: , Rfl:    betamethasone dipropionate (DIPROLENE) 0.05 % ointment, Apply 1 Application topically as needed (outbreaks)., Disp: , Rfl:    Bevacizumab  (AVASTIN  IV), Inject into the vein. Every 16 weeks eye injection - left eye   Changed from 12 weeks per patient., Disp: , Rfl:    Cholecalciferol (D3 PO), Take 1 capsule by mouth daily. (Patient not taking: Reported on 08/22/2023), Disp: , Rfl:    clopidogrel  (PLAVIX ) 75 MG tablet, Take 1 tablet (75 mg total) by mouth daily., Disp: 30 tablet, Rfl: 2   hydrOXYzine  (ATARAX ) 10 MG tablet, Take 1 tablet (10 mg total) by mouth 3 (three) times daily as needed for itching (Ok to take 2 pills at bedtime to help with sleep.)., Disp: 30 tablet, Rfl: 0   olmesartan  (BENICAR ) 20 MG tablet, Take 0.5 tablets (10 mg total) by mouth daily., Disp: 90 tablet, Rfl: 0   trimethoprim-polymyxin b (POLYTRIM) ophthalmic solution, Place 4 drops into the left eye every 4 (four) hours. One drop into Left eye 4 times a day., Disp: , Rfl:    Physical Exam:   BP (!) 150/83   Pulse 74   SpO2 97%   Salient findings:  CN  II-XII intact Given history and complaints, ear microscopy was indicated and performed for evaluation with findings as below in physical exam section and in procedures;  Bilateral EAC clear and TM intact with well pneumatized middle ear spaces, mild global retraction Weber 512: right Rinne 512: AC > BC b/l  Anterior rhinoscopy: Septum intact; bilateral inferior turbinates without significant hypertrophy No lesions of oral cavity/oropharynx No obviously palpable neck masses/lymphadenopathy/thyromegaly No respiratory distress or stridor  Seprately Identifiable Procedures:  Prior to initiating any procedures, risks/benefits/alternatives were explained to the patient and verbal consent obtained. Procedure: Bilateral ear microscopy using microscope (CPT [phone]) Pre-procedure diagnosis: sudden right hearing loss, eustachian tube dysfunction Post-procedure diagnosis: same Indication: see above; given patient's otologic complaints and history, for improved and comprehensive examination of external ear and tympanic membrane, bilateral otologic examination using microscope was performed  Procedure: Patient was placed semi-recumbent. Both ear canals were examined using the microscope with findings above Patient tolerated  the procedure well.   Impression & Plans:  Addysen Louth is a 78 y.o. female with:  1. Sensorineural hearing loss (SNHL) of both ears   2. Asymmetric SNHL (sensorineural hearing loss)   3. Sudden right hearing loss   4. Dysfunction of right eustachian tube    Appears to be two different problems chronologically. First sudden onset HL and then had URI sx and sx that sound like ETD. She has been on pred and antibiotics without significant benefit. Prior B/B tymps without left symptoms but after autoinsufflation and brief course of nasal sprays, she has improved.  No MRI IAC but did have recent MRI which did not show large retrocochlear lesion so she wishes to avoid any MRI for  now.  We discussed options: resumption of sprays, MRI IAC, amplification She reports that given her current health issues, she would like to observe and re-start flonase  BID and take a conservative course.  As such,will re-start flonase  BID (did not like astelin ); consider amplification - Return precautions discussed; f/u in 1 year with HT, sooner as needed  Thank you for allowing me the opportunity to care for your patient. Please do not hesitate to contact me should you have any other questions.  Sincerely, Eldora Blanch, MD Otolaryngologist (ENT), [facility] Phone: [phone] Fax: [phone]  09/12/2023, 12:17 PM   MDM:  Level 4 - 99214 Complexity/Problems addressed: mod - multiple chronic issues Data complexity: mod - independent interpretation of imaging - Morbidity: mod  - Prescription Drug prescribed or managed: y

## 2023-09-16 ENCOUNTER — Ambulatory Visit (INDEPENDENT_AMBULATORY_CARE_PROVIDER_SITE_OTHER)

## 2023-09-16 VITALS — BP 122/78 | HR 89 | Temp 98.3°F | Ht 64.0 in | Wt 150.8 lb

## 2023-09-16 DIAGNOSIS — Z0000 Encounter for general adult medical examination without abnormal findings: Secondary | ICD-10-CM | POA: Diagnosis not present

## 2023-09-16 NOTE — Progress Notes (Signed)
 Subjective:   Margaret Sullivan is a 78 y.o. who presents for a Medicare Wellness preventive visit.  As a reminder, Annual Wellness Visits don't include a physical exam, and some assessments may be limited, especially if this visit is performed virtually. We may recommend an in-person follow-up visit with your provider if needed.  Visit Complete: In person    Persons Participating in Visit: Patient.  AWV Questionnaire: No: Patient Medicare AWV questionnaire was not completed prior to this visit.  Cardiac Risk Factors include: advanced age (>36men, >7 women);dyslipidemia;hypertension     Objective:    Today's Vitals   09/16/23 0929  BP: 122/78  Pulse: 89  Temp: 98.3 F (36.8 C)  SpO2: 98%  Weight: 150 lb 12.8 oz (68.4 kg)  Height: 5' 4 (1.626 m)   Body mass index is 25.88 kg/m.     09/16/2023    9:48 AM 07/12/2023    4:00 PM 08/30/2022    9:57 AM 11/08/2021    3:01 PM 11/14/2020    4:12 PM 09/02/2019    3:11 PM 07/15/2019   12:46 PM  Advanced Directives  Does Patient Have a Medical Advance Directive? Yes Yes No Yes No Yes Yes  Type of Estate agent of [place];Living will Healthcare Power of [employer] of [place];Living will  Healthcare Power of [place];Living will   Does patient want to make changes to medical advance directive?  No - Patient declined  No - Patient declined   No - Patient declined  Copy of Healthcare Power of Attorney in Chart? No - copy requested No - copy requested  No - copy requested  No - copy requested   Would patient like information on creating a medical advance directive?     No - Patient declined      Current Medications (verified) Outpatient Encounter Medications as of 09/16/2023  Medication Sig   Ascorbic Acid  (VITAMIN C ) 500 MG CAPS Take 500 mg by mouth daily.   betamethasone dipropionate (DIPROLENE) 0.05 % ointment Apply 1 Application topically as needed (outbreaks).   Bevacizumab  (AVASTIN  IV) Inject  into the vein. Every 16 weeks eye injection - left eye   Changed from 12 weeks per patient.   hydrOXYzine  (ATARAX ) 10 MG tablet Take 1 tablet (10 mg total) by mouth 3 (three) times daily as needed for itching (Ok to take 2 pills at bedtime to help with sleep.).   olmesartan  (BENICAR ) 20 MG tablet Take 0.5 tablets (10 mg total) by mouth daily.   trimethoprim-polymyxin b (POLYTRIM) ophthalmic solution Place 4 drops into the left eye every 4 (four) hours. One drop into Left eye 4 times a day.   Cholecalciferol (D3 PO) Take 1 capsule by mouth daily. (Patient not taking: Reported on 09/16/2023)   [DISCONTINUED] clopidogrel  (PLAVIX ) 75 MG tablet Take 1 tablet (75 mg total) by mouth daily.   No facility-administered encounter medications on file as of 09/16/2023.    Allergies (verified) Ampicillin, Aspirin, Doxycycline , Keflex [cephalexin], Macrodantin [nitrofurantoin macrocrystal], Percocet [oxycodone-acetaminophen ], Plavix  [clopidogrel ], Propulsid [cisapride], Voltaren [diclofenac], Codeine, and Penicillins   History: Past Medical History:  Diagnosis Date   Abnormal glucose    Allergy    Essential tremor    GERD (gastroesophageal reflux disease)    Heart murmur    High cholesterol    Hyperlipidemia    Hypertension    labile   Labile hypertension    Medication management 06/10/2013   Prediabetes    Past Surgical History:  Procedure  Laterality Date   BACK SURGERY  2001   COLONOSCOPY     TONSILLECTOMY AND ADENOIDECTOMY Bilateral 1950   UPPER GASTROINTESTINAL ENDOSCOPY     WISDOM TOOTH EXTRACTION Bilateral 1969   Family History  Problem Relation Age of Onset   Lymphoma Mother        NHL   Heart attack Father    Breast cancer Father    Colon cancer Maternal Uncle    Esophageal cancer Neg Hx    Stomach cancer Neg Hx    Rectal cancer Neg Hx    Social History   Socioeconomic History   Marital status: Divorced    Spouse name: Not on file   Number of children: 0   Years of  education: Not on file   Highest education level: Master's degree (e.g., MA, MS, MEng, MEd, MSW, MBA)  Occupational History    Comment: retired Runner, broadcasting/film/video  Tobacco Use   Smoking status: Never   Smokeless tobacco: Never  Vaping Use   Vaping status: Never Used  Substance and Sexual Activity   Alcohol use: Never   Drug use: Never   Sexual activity: Not on file  Other Topics Concern   Not on file  Social History Narrative   Lives with brother   Caffeine- 8-10 oz daily   Social Drivers of Health   Financial Resource Strain: Low Risk  (09/16/2023)   Overall Financial Resource Strain (CARDIA)    Difficulty of Paying Living Expenses: Not hard at all  Food Insecurity: No Food Insecurity (09/16/2023)   Hunger Vital Sign    Worried About Running Out of Food in the Last Year: Never true    Ran Out of Food in the Last Year: Never true  Transportation Needs: No Transportation Needs (09/16/2023)   PRAPARE - Administrator, Civil Service (Medical): No    Lack of Transportation (Non-Medical): No  Physical Activity: Insufficiently Active (09/16/2023)   Exercise Vital Sign    Days of Exercise per Week: 5 days    Minutes of Exercise per Session: 20 min  Stress: Stress Concern Present (09/16/2023)   [employer] of Occupational Health - Occupational Stress Questionnaire    Feeling of Stress: Rather much  Social Connections: Moderately Isolated (09/16/2023)   Social Connection and Isolation Panel    Frequency of Communication with Friends and Family: More than three times a week    Frequency of Social Gatherings with Friends and Family: More than three times a week    Attends Religious Services: 1 to 4 times per year    Active Member of [employer] or Organizations: No    Attends Banker Meetings: Never    Marital Status: Divorced    Tobacco Counseling Counseling given: Not Answered    Clinical Intake:  Pre-visit preparation completed: Yes  Pain : No/denies pain      BMI - recorded: 25.88 Nutritional Status: BMI 25 -29 Overweight Nutritional Risks: None Diabetes: No  Lab Results  Component Value Date   HGBA1C 5.1 07/12/2023   HGBA1C 5.8 (H) 06/26/2022   HGBA1C 5.5 11/08/2021     How often do you need to have someone help you when you read instructions, pamphlets, or other written materials from your doctor or pharmacy?: 1 - Never  Interpreter Needed?: No  Information entered by :: Ellouise Haws, LPN   Activities of Daily Living     09/16/2023    9:30 AM 07/12/2023    4:00 PM  In your present  state of health, do you have any difficulty performing the following activities:  Hearing? 1 1  Comment right ear loss hearing right ear  Vision? 0 0  Difficulty concentrating or making decisions? 0 0  Walking or climbing stairs? 0   Dressing or bathing? 0   Doing errands, shopping? 0 0  Preparing Food and eating ? N   Using the Toilet? N   In the past six months, have you accidently leaked urine? Y   Comment pads   Do you have problems with loss of bowel control? N   Managing your Medications? N   Managing your Finances? N   Housekeeping or managing your Housekeeping? N     Patient Care Team: Lucius Krabbe, NP as PCP - General (Family Medicine) Robinson Idol, MD as Consulting Physician (Ophthalmology) Craig Edison (Dentistry) Craig Alan JONELLE DEVONNA as Referring Physician (Physician Assistant) Craig Alan JONELLE, PA-C as Referring Physician (Physician Assistant)  I have updated your Care Teams any recent Medical Services you may have received from other providers in the past year.     Assessment:   This is a routine wellness examination for Evelisse.  Hearing/Vision screen Hearing Screening - Comments:: Right ear hearing loss  Vision Screening - Comments:: Wears rx glasses - up to date with routine eye exams with Dr Hurman Robinson    Goals Addressed             This Visit's Progress    Patient Stated       Maintain health  and activity        Depression Screen     09/16/2023    9:37 AM 07/16/2023   10:01 AM 11/08/2021    3:03 PM 11/14/2020    4:25 PM 07/15/2019   12:47 PM 08/01/2018   11:02 AM 07/03/2017   12:37 PM  PHQ 2/9 Scores  PHQ - 2 Score 0 0 0 0 1 0 0    Fall Risk     09/16/2023    9:48 AM 08/22/2023    1:02 PM 08/13/2023   10:01 AM 11/08/2021    3:02 PM 11/14/2020    4:14 PM  Fall Risk   Falls in the past year? 0 0 0 0 0  Number falls in past yr: 0 0 0 0 0  Injury with Fall? 0 0 0 0 0  Risk for fall due to : No Fall Risks No Fall Risks No Fall Risks No Fall Risks No Fall Risks  Follow up Falls prevention discussed Falls evaluation completed Falls evaluation completed Falls prevention discussed;Falls evaluation completed  Falls evaluation completed;Falls prevention discussed      Data saved with a previous flowsheet row definition    MEDICARE RISK AT HOME:  Medicare Risk at Home Any stairs in or around the home?: Yes If so, are there any without handrails?: No Home free of loose throw rugs in walkways, pet beds, electrical cords, etc?: Yes Adequate lighting in your home to reduce risk of falls?: Yes Life alert?: No Use of a cane, walker or w/c?: No Grab bars in the bathroom?: No Shower chair or bench in shower?: No Elevated toilet seat or a handicapped toilet?: No  TIMED UP AND GO:  Was the test performed?  Yes  Length of time to ambulate 10 feet: 10 sec Gait steady and fast without use of assistive device  Cognitive Function: 6CIT completed    09/20/2021    3:23 PM 09/15/2019    2:58 PM  MMSE - Mini Mental State Exam  Orientation to time 5 5  Orientation to Place 5 5  Registration 3 3  Attention/ Calculation 5 5  Recall 3 3  Language- name 2 objects 2 2  Language- repeat 1 1  Language- follow 3 step command 3 3  Language- read & follow direction 1 1  Write a sentence 1 0  Write a sentence-comments  no subject  Copy design 1 1  Total score 30 29        09/16/2023     9:49 AM  6CIT Screen  What Year? 0 points  What month? 0 points  What time? 0 points  Count back from 20 0 points  Months in reverse 0 points  Repeat phrase 0 points  Total Score 0 points    Immunizations Immunization History  Administered Date(s) Administered   Pneumococcal-Unspecified 03/05/1997   Td 03/05/1998    Screening Tests Health Maintenance  Topic Date Due   COVID-19 Vaccine (1) Never done   Zoster Vaccines- Shingrix (1 of 2) 10/09/2023 (Originally 12/23/1964)   DTaP/Tdap/Td (2 - Tdap) 07/08/2024 (Originally 03/05/2008)   Pneumococcal Vaccine: 50+ Years (1 of 1 - PCV) 07/08/2024 (Originally 12/24/1995)   Colonoscopy  07/08/2024 (Originally 05/21/2022)   DEXA SCAN  07/15/2024 (Originally 12/24/2010)   INFLUENZA VACCINE  10/04/2023   Medicare Annual Wellness (AWV)  09/15/2024   Hepatitis C Screening  Completed   Hepatitis B Vaccines  Aged Out   HPV VACCINES  Aged Out   Meningococcal B Vaccine  Aged Out    Health Maintenance  Health Maintenance Due  Topic Date Due   COVID-19 Vaccine (1) Never done   Health Maintenance Items Addressed: See Nurse Notes at the end of this note  Additional Screening:  Vision Screening: Recommended annual ophthalmology exams for early detection of glaucoma and other disorders of the eye. Would you like a referral to an eye doctor? No    Dental Screening: Recommended annual dental exams for proper oral hygiene  Community Resource Referral / Chronic Care Management: CRR required this visit?  No   CCM required this visit?  No   Plan:    I have personally reviewed and noted the following in the patient's chart:   Medical and social history Use of alcohol, tobacco or illicit drugs  Current medications and supplements including opioid prescriptions. Patient is not currently taking opioid prescriptions. Functional ability and status Nutritional status Physical activity Advanced directives List of other  physicians Hospitalizations, surgeries, and ER visits in previous 12 months Vitals Screenings to include cognitive, depression, and falls Referrals and appointments  In addition, I have reviewed and discussed with patient certain preventive protocols, quality metrics, and best practice recommendations. A written personalized care plan for preventive services as well as general preventive health recommendations were provided to patient.   Ellouise VEAR Haws, LPN   2/85/7974   After Visit Summary: (In Person-Printed) AVS printed and given to the patient  Notes: Nothing significant to report at this time.

## 2023-09-16 NOTE — Patient Instructions (Addendum)
 Margaret Sullivan , Thank you for taking time out of your busy schedule to complete your Annual Wellness Visit with me. I enjoyed our conversation and look forward to speaking with you again next year. I, as well as your care team,  appreciate your ongoing commitment to your health goals. Please review the following plan we discussed and let me know if I can assist you in the future. Your Game plan/ To Do List    Referrals: If you haven't heard from the office you've been referred to, please reach out to them at the phone provided.   Follow up Visits: Next Medicare AWV with our clinical staff: 09/21/24   Have you seen your provider in the last 6 months (3 months if uncontrolled diabetes)? Yes Next Office Visit with your provider: 10/08/23  Clinician Recommendations:  Aim for 30 minutes of exercise or brisk walking, 6-8 glasses of water, and 5 servings of fruits and vegetables each day.       This is a list of the screening recommended for you and due dates:  Health Maintenance  Topic Date Due   COVID-19 Vaccine (1) Never done   Medicare Annual Wellness Visit  11/09/2022   Zoster (Shingles) Vaccine (1 of 2) 10/09/2023*   DTaP/Tdap/Td vaccine (2 - Tdap) 07/08/2024*   Pneumococcal Vaccine for age over 55 (1 of 1 - PCV) 07/08/2024*   Colon Cancer Screening  07/08/2024*   DEXA scan (bone density measurement)  07/15/2024*   Flu Shot  10/04/2023   Hepatitis C Screening  Completed   Hepatitis B Vaccine  Aged Out   HPV Vaccine  Aged Out   Meningitis B Vaccine  Aged Out  *Topic was postponed. The date shown is not the original due date.    Advanced directives: (Copy Requested) Please bring a copy of your health care power of attorney and living will to the office to be added to your chart at your convenience. You can mail to [facility] [address] [place], [place] [address] or email to [email or web address] Advance Care Planning is important because it:  [x]  Makes sure you  receive the medical care that is consistent with your values, goals, and preferences  [x]  It provides guidance to your family and loved ones and reduces their decisional burden about whether or not they are making the right decisions based on your wishes.  Follow the link provided in your after visit summary or read over the paperwork we have mailed to you to help you started getting your Advance Directives in place. If you need assistance in completing these, please reach out to us  so that we can help you!  See attachments for Preventive Care and Fall Prevention Tips.

## 2023-10-08 ENCOUNTER — Ambulatory Visit (INDEPENDENT_AMBULATORY_CARE_PROVIDER_SITE_OTHER): Admitting: Family

## 2023-10-08 ENCOUNTER — Encounter: Payer: Self-pay | Admitting: Family

## 2023-10-08 VITALS — BP 127/72 | HR 83 | Temp 97.9°F | Ht 64.0 in | Wt 148.5 lb

## 2023-10-08 DIAGNOSIS — Z0000 Encounter for general adult medical examination without abnormal findings: Secondary | ICD-10-CM | POA: Diagnosis not present

## 2023-10-08 DIAGNOSIS — R0989 Other specified symptoms and signs involving the circulatory and respiratory systems: Secondary | ICD-10-CM

## 2023-10-08 DIAGNOSIS — E782 Mixed hyperlipidemia: Secondary | ICD-10-CM | POA: Diagnosis not present

## 2023-10-08 DIAGNOSIS — Z860100 Personal history of colon polyps, unspecified: Secondary | ICD-10-CM

## 2023-10-08 LAB — CBC WITH DIFFERENTIAL/PLATELET
Basophils Absolute: 0.1 K/uL (ref 0.0–0.1)
Basophils Relative: 0.6 % (ref 0.0–3.0)
Eosinophils Absolute: 0.2 K/uL (ref 0.0–0.7)
Eosinophils Relative: 2 % (ref 0.0–5.0)
HCT: 40.3 % (ref 36.0–46.0)
Hemoglobin: 13.8 g/dL (ref 12.0–15.0)
Lymphocytes Relative: 17.1 % (ref 12.0–46.0)
Lymphs Abs: 1.4 K/uL (ref 0.7–4.0)
MCHC: 34.3 g/dL (ref 30.0–36.0)
MCV: 89.9 fl (ref 78.0–100.0)
Monocytes Absolute: 0.8 K/uL (ref 0.1–1.0)
Monocytes Relative: 9.8 % (ref 3.0–12.0)
Neutro Abs: 5.9 K/uL (ref 1.4–7.7)
Neutrophils Relative %: 70.5 % (ref 43.0–77.0)
Platelets: 244 K/uL (ref 150.0–400.0)
RBC: 4.48 Mil/uL (ref 3.87–5.11)
RDW: 12.6 % (ref 11.5–15.5)
WBC: 8.3 K/uL (ref 4.0–10.5)

## 2023-10-08 LAB — LIPID PANEL
Cholesterol: 214 mg/dL — ABNORMAL HIGH (ref 0–200)
HDL: 32.1 mg/dL — ABNORMAL LOW (ref >39.00)
LDL Cholesterol: 137 mg/dL — ABNORMAL HIGH (ref 0–99)
NonHDL: 181.52
Total CHOL/HDL Ratio: 7
Triglycerides: 221 mg/dL — ABNORMAL HIGH (ref 0.0–149.0)
VLDL: 44.2 mg/dL — ABNORMAL HIGH (ref 0.0–40.0)

## 2023-10-08 LAB — COMPREHENSIVE METABOLIC PANEL WITH GFR
ALT: 27 U/L (ref 0–35)
AST: 34 U/L (ref 0–37)
Albumin: 4.9 g/dL (ref 3.5–5.2)
Alkaline Phosphatase: 68 U/L (ref 39–117)
BUN: 16 mg/dL (ref 6–23)
CO2: 26 meq/L (ref 19–32)
Calcium: 9.5 mg/dL (ref 8.4–10.5)
Chloride: 103 meq/L (ref 96–112)
Creatinine, Ser: 0.81 mg/dL (ref 0.40–1.20)
GFR: 69.84 mL/min (ref >60.00)
Glucose, Bld: 79 mg/dL (ref 70–99)
Potassium: 4 meq/L (ref 3.5–5.1)
Sodium: 138 meq/L (ref 135–145)
Total Bilirubin: 0.7 mg/dL (ref 0.2–1.2)
Total Protein: 8.3 g/dL (ref 6.0–8.3)

## 2023-10-08 LAB — TSH: TSH: 2.18 u[IU]/mL (ref 0.35–5.50)

## 2023-10-08 NOTE — Patient Instructions (Addendum)
 It was very nice to see you today!   I will review your lab results via MyChart in a few days.  Ok to take 1/2 pill of your Olmesartan  in the morning in addition in the evening dose. Keep monitoring your readings and see if they come down to 110/60 range.  As long as you are not dizzy or lightheaded.   Schedule a 3 month follow visit today.     PLEASE NOTE:  If you had any lab tests please let us  know if you have not heard back within a few days. You may see your results on MyChart before we have a chance to review them but we will give you a call once they are reviewed by us . If we ordered any referrals today, please let us  know if you have not heard from their office within the next week.

## 2023-10-08 NOTE — Assessment & Plan Note (Signed)
 Cholesterol reduced with diet, but further improvement needed as ASCVD score is high at almost 24% risk. Discussed starting low-dose statin at least 3d/week, but pt continues to refuse, wanting to work on diet and exercise. - Continue dietary modifications focusing on low saturated fat, unprocessed foods & increased exercise. - Consider low-dose statin a few days a week if dietary changes are insufficient.

## 2023-10-08 NOTE — Assessment & Plan Note (Signed)
 Blood pressure readings variable. In-office reading acceptable. Discussed increasing olmesartan  to twice daily for better control at home and monitor for any potential side effects of increased dosing. - Consider olmesartan  10 mg twice daily if blood pressure remains inconsistent. - Monitor blood pressure at home and report readings consistently below 105/60 or symptoms of dizziness or lightheadedness. - F/U in 3mos

## 2023-10-08 NOTE — Progress Notes (Signed)
 Phone [phone]  Subjective:   Patient is a 78 y.o. female presenting for annual physical.    Chief Complaint  Patient presents with   Annual Exam    Non fasting w/ labs  Discussed the use of AI scribe software for clinical note transcription with the patient, who gave verbal consent to proceed.  History of Present Illness Margaret Sullivan is a 78 year old female with hypertension who presents for a physical and with concerns about blood pressure management and fatigue.  Blood pressure management - Monitors blood pressure at home with a new machine, showing readings from 138/77 to 175/85 - Older home blood pressure machine showed slightly lower readings - Home BP machine was 142/76 after a clinic reading of 128/77 - Takes olmesartan  10 mg daily for hypertension - Expresses concern about blood pressure control  Fatigue and neuropsychiatric symptoms - Experiences significant fatigue daily from 2:00 PM to 7:30 PM - No headaches, dizziness, or head pressure during fatigue episodes - Increased irritability and occasional crying spells associated with fatigue - Concerned about potential side effects of olmesartan  started in may and Avastin , which she has been taking for three years  Dermatologic and hair changes - Significant hair loss - Poor nail health - Slow healing of skin injuries, including a leg injury present for three weeks  Gastrointestinal history - History of colon polyps - Last colonoscopy in 2021 showed a small polyp  Lifestyle and dietary modifications - Mindful of cholesterol levels and follows a diet to reduce cholesterol - Remains active with gardening and downsizing her home - Reduces caffeine intake - Focuses on vegetable consumption and avoids fried foods  See problem oriented charting- ROS- full  review of systems was completed and negative except for what is noted in HPI above.  The following were reviewed and entered/updated in epic: Past Medical  History:  Diagnosis Date   Abnormal glucose    Allergy    Essential tremor    GERD (gastroesophageal reflux disease)    Heart murmur    High cholesterol    Hyperlipidemia    Hypertension    labile   Labile hypertension    Medication management 06/10/2013   Prediabetes    Patient Active Problem List   Diagnosis Date Noted   Atherosclerosis of aorta (HCC) 07/16/2023   Hearing loss 07/16/2023   TIA (transient ischemic attack) 07/12/2023   Retinal hemorrhage of left eye 07/09/2023   Fatty liver 09/05/2016   Poor compliance with medical recommendations 04/29/2014   Vitamin D  deficiency 06/10/2013   GERD    Hyperlipidemia    Hypertension    Past Surgical History:  Procedure Laterality Date   BACK SURGERY  2001   COLONOSCOPY     TONSILLECTOMY AND ADENOIDECTOMY Bilateral 1950   UPPER GASTROINTESTINAL ENDOSCOPY     WISDOM TOOTH EXTRACTION Bilateral 1969    Family History  Problem Relation Age of Onset   Lymphoma Mother        NHL   Heart attack Father    Breast cancer Father    Colon cancer Maternal Uncle    Esophageal cancer Neg Hx    Stomach cancer Neg Hx    Rectal cancer Neg Hx    Medications- reviewed and updated Current Outpatient Medications  Medication Sig Dispense Refill   Ascorbic Acid  (VITAMIN C ) 500 MG CAPS Take 500 mg by mouth daily.     betamethasone dipropionate (DIPROLENE) 0.05 % ointment Apply 1 Application topically as needed (outbreaks).  Bevacizumab  (AVASTIN  IV) Inject into the vein. Every 16 weeks eye injection - left eye   Changed from 12 weeks per patient.     hydrOXYzine  (ATARAX ) 10 MG tablet Take 1 tablet (10 mg total) by mouth 3 (three) times daily as needed for itching (Ok to take 2 pills at bedtime to help with sleep.). 30 tablet 0   olmesartan  (BENICAR ) 20 MG tablet Take 0.5 tablets (10 mg total) by mouth daily. 90 tablet 0   trimethoprim-polymyxin b (POLYTRIM) ophthalmic solution Place 4 drops into the left eye every 4 (four) hours. One  drop into Left eye 4 times a day.     Cholecalciferol (D3 PO) Take 1 capsule by mouth daily. (Patient not taking: Reported on 10/08/2023)     No current facility-administered medications for this visit.   Allergies-reviewed and updated Allergies  Allergen Reactions   Ampicillin Hives and Swelling   Aspirin Itching    Spoke with patient and she endorsed itching on the top of her head when she took ASA awhile back. Allergy was listed in 2015.   Doxycycline  Nausea And Vomiting   Keflex [Cephalexin] Hives    Itching   Macrodantin [Nitrofurantoin Macrocrystal] Hives, Itching and Swelling   Percocet [Oxycodone-Acetaminophen ]    Plavix  [Clopidogrel ] Itching   Propulsid [Cisapride] Itching and Swelling   Voltaren [Diclofenac] Hives and Itching   Codeine Palpitations   Penicillins Swelling and Rash    Social History   Social History Narrative   Lives with brother   Caffeine- 8-10 oz daily    Objective:  BP 127/72 (BP Location: Left Arm, Patient Position: Sitting, Cuff Size: Normal)   Pulse 83   Temp 97.9 F (36.6 C) (Temporal)   Ht 5' 4 (1.626 m)   Wt 148 lb 8 oz (67.4 kg)   SpO2 95%   BMI 25.49 kg/m  Physical Exam Vitals and nursing note reviewed.  Constitutional:      Appearance: Normal appearance.  HENT:     Head: Normocephalic.     Right Ear: Tympanic membrane normal.     Left Ear: Tympanic membrane normal.     Nose: Nose normal.     Mouth/Throat:     Mouth: Mucous membranes are moist.  Eyes:     Pupils: Pupils are equal, round, and reactive to light.  Cardiovascular:     Rate and Rhythm: Normal rate and regular rhythm.  Pulmonary:     Effort: Pulmonary effort is normal.     Breath sounds: Normal breath sounds.  Musculoskeletal:        General: Normal range of motion.     Cervical back: Normal range of motion.  Lymphadenopathy:     Cervical: No cervical adenopathy.  Skin:    General: Skin is warm and dry.  Neurological:     Mental Status: She is alert.   Psychiatric:        Mood and Affect: Mood normal.        Behavior: Behavior normal.      Assessment and Plan   Health Maintenance counseling: 1. Anticipatory guidance: Patient counseled regarding regular dental exams q6 months, eye exams,  avoiding smoking and second hand smoke, limiting alcohol to 1 beverage per day, no illicit drugs.   2. Risk factor reduction:  Advised patient of need for regular exercise and diet rich with fruits and vegetables to reduce risk of heart attack and stroke.  Wt Readings from Last 3 Encounters:  10/08/23 148 lb 8 oz (67.4 kg)  09/16/23 150 lb 12.8 oz (68.4 kg)  08/22/23 148 lb 9.6 oz (67.4 kg)   3. Immunizations/screenings/ancillary studies Immunization History  Administered Date(s) Administered   Pneumococcal-Unspecified 03/05/1997   Td 03/05/1998   There are no preventive care reminders to display for this patient.  4. Cervical cancer screening- N/A, postmenopausal 5. Breast cancer screening-  mammogram last done about 69yrs ago, normal 6. Colon cancer screening - last done 2021, will call her GI to schedule - was asked to come back in 3y 7. Skin cancer screening- advised regular sunscreen use. Denies worrisome, changing, or new skin lesions.  8. Birth control/STD check- none/N/A 9. Osteoporosis screening- ordered 10. Alcohol screening: social  11. Smoking associated screening (lung cancer screening, AAA screen 65-75, UA)- never- smoker 12. Exercise- gardening Assessment & Plan Hypertension Blood pressure readings variable. In-office reading acceptable. Discussed increasing olmesartan  to twice daily for better control at home and monitor for any potential side effects of increased dosing. - Consider olmesartan  10 mg twice daily if blood pressure remains inconsistent. - Monitor blood pressure at home and report readings consistently below 105/60 or symptoms of dizziness or lightheadedness. - F/U in 3mos  Hyperlipidemia Cholesterol reduced  with diet, but further improvement needed as ASCVD score is high at almost 24% risk. Discussed starting low-dose statin at least 3d/week, but pt continues to refuse, wanting to work on diet and exercise. - Continue dietary modifications focusing on low saturated fat, unprocessed foods & increased exercise. - Consider low-dose statin a few days a week if dietary changes are insufficient.  Fatigue and mood changes Fatigue and mood changes possibly due to Avastin  and postmenopausal changes. Olmesartan  unlikely cause. Discussed power naps and dietary changes. Can consider low dose SSRI if needed. - Discuss Avastin  with provider - Check thyroid  function to rule out thyroid -related causes of fatigue. - Encourage short power naps of 15-30 minutes to manage fatigue. - Continue dietary modifications to reduce processed foods and sugars.  History of colon polyps Last colonoscopy in 2021 with follow-up recommended in 3 years. - Schedule follow-up colonoscopy as recommended.   Recommended follow up:  No follow-ups on file. Future Appointments  Date Time Provider Department Center  11/06/2023 10:00 AM Penumalli, Eduard SAUNDERS, MD [facility] None  11/20/2023 12:35 PM Alvia Norleen BIRCH, MD [facility] None  09/21/2024 10:00 AM [facility] ANNUAL WELLNESS VISIT 1 [facility]    Lab/Order associations:  non-fasting   Lucius Krabbe, NP

## 2023-10-11 ENCOUNTER — Ambulatory Visit: Payer: Self-pay | Admitting: Family

## 2023-10-29 NOTE — Telephone Encounter (Signed)
 Please advise   Copied from CRM #[id number]. Topic: Clinical - Lab/Test Results >> Oct 29, 2023 11:52 AM Mercedes MATSU wrote: Reason for CRM: Patient called in stating that she wanted a copy of her most recent lab work. She also stated that she has still been feeling fatigued and she wants to know if anything in her blood work is the reasoning behind this. Patient can be reached at [phone] or [phone].

## 2023-10-29 NOTE — Progress Notes (Signed)
 no, her labs were all normal except for high cholesterol. I have talked about her high carbohydrate diet including sweets. She has to avoid white carbs: bread, snacks, pasta, rice, and sweets. Ok to 2-3 small servings of whole fruit daily if wanting something sweet. Eat only carbs with fiber, increase lean protein in diet with vegetables (not canned or fried). Increase cardio exercise, 20-62minutes most days, getting heart rate above 110 beats per minute. All the above can improve energy. Can send nutrition referral if she likes. Thx

## 2023-11-06 ENCOUNTER — Ambulatory Visit: Admitting: Diagnostic Neuroimaging

## 2023-11-06 ENCOUNTER — Encounter: Payer: Self-pay | Admitting: Diagnostic Neuroimaging

## 2023-11-06 VITALS — BP 161/81 | HR 75 | Ht 64.0 in | Wt 150.0 lb

## 2023-11-06 DIAGNOSIS — F419 Anxiety disorder, unspecified: Secondary | ICD-10-CM | POA: Diagnosis not present

## 2023-11-06 DIAGNOSIS — G459 Transient cerebral ischemic attack, unspecified: Secondary | ICD-10-CM | POA: Diagnosis not present

## 2023-11-06 DIAGNOSIS — G43109 Migraine with aura, not intractable, without status migrainosus: Secondary | ICD-10-CM

## 2023-11-06 NOTE — Progress Notes (Signed)
 [facility]  PATIENT: Margaret Sullivan DOB: 05/19/1945  REFERRING CLINICIAN: Lucius Krabbe, NP HISTORY FROM: patient  REASON FOR VISIT: new consult    HISTORICAL  CHIEF COMPLAINT:  Chief Complaint  Patient presents with   Transient Ischemic Attack    Rm 6 alone Pt is well, reports residual ocular migraines and fatigue. No other TIA concerns.  She also mentions onset R sided hearing loss in Feb.     HISTORY OF PRESENT ILLNESS:   UPDATE (11/06/23, VRP): Since last visit, here for TIA evaluation. May 2025 went to ER for sudden vision changes and aphasia. Vision changes felt like her prior migraine aura (looking through film). Was discharged on plavix , and did well x 6-7 weeks, then had a rash, so patient stopped plavix .   PRIOR HPI (09/15/19, VRP): 78 year old female here for evaluation of transient visual loss, memory loss, tremor.  May 2021 patient had transient visual loss, but cannot tell if it was this was on the right or left side.  Symptoms lasted about 10 minutes.  She felt like a veil, over her vision.  This was not total visual loss but partial.  No other associated symptoms.  Patient also has had some mild intermittent memory loss problems.  She has had more stress lately.  She is managing anxiety symptoms.  Also has had long history of postural tremor, previously diagnosed essential tremor.   REVIEW OF SYSTEMS: Full 14 system review of systems performed and negative with exception of: As per HPI.  ALLERGIES: Allergies  Allergen Reactions   Ampicillin Hives and Swelling   Aspirin Itching    Spoke with patient and she endorsed itching on the top of her head when she took ASA awhile back. Allergy was listed in 2015.   Doxycycline  Nausea And Vomiting   Keflex [Cephalexin] Hives    Itching   Macrodantin [Nitrofurantoin Macrocrystal] Hives, Itching and Swelling   Percocet [Oxycodone-Acetaminophen ]    Plavix  [Clopidogrel ] Itching   Propulsid  [Cisapride] Itching and Swelling   Voltaren [Diclofenac] Hives and Itching   Codeine Palpitations   Penicillins Swelling and Rash    HOME MEDICATIONS: Outpatient Medications Prior to Visit  Medication Sig Dispense Refill   Ascorbic Acid  (VITAMIN C ) 500 MG CAPS Take 500 mg by mouth daily.     betamethasone dipropionate (DIPROLENE) 0.05 % ointment Apply 1 Application topically as needed (outbreaks).     Bevacizumab  (AVASTIN  IV) Inject into the vein. Every 16 weeks eye injection - left eye   Changed from 12 weeks per patient.     Cholecalciferol (D3 PO) Take 1 capsule by mouth daily.     olmesartan  (BENICAR ) 20 MG tablet Take 0.5 tablets (10 mg total) by mouth daily. 90 tablet 0   trimethoprim-polymyxin b (POLYTRIM) ophthalmic solution Place 4 drops into the left eye every 4 (four) hours. One drop into Left eye 4 times a day.     No facility-administered medications prior to visit.    PAST MEDICAL HISTORY: Past Medical History:  Diagnosis Date   Abnormal glucose    Allergy    Essential tremor    GERD (gastroesophageal reflux disease)    Heart murmur    High cholesterol    Hyperlipidemia    Hypertension    labile   Labile hypertension    Medication management 06/10/2013   Prediabetes     PAST SURGICAL HISTORY: Past Surgical History:  Procedure Laterality Date   BACK SURGERY  2001   COLONOSCOPY  TONSILLECTOMY AND ADENOIDECTOMY Bilateral 1950   UPPER GASTROINTESTINAL ENDOSCOPY     WISDOM TOOTH EXTRACTION Bilateral 1969    FAMILY HISTORY: Family History  Problem Relation Age of Onset   Lymphoma Mother        NHL   Heart attack Father    Breast cancer Father    Colon cancer Maternal Uncle    Esophageal cancer Neg Hx    Stomach cancer Neg Hx    Rectal cancer Neg Hx     SOCIAL HISTORY: Social History   Socioeconomic History   Marital status: Divorced    Spouse name: Not on file   Number of children: 0   Years of education: Not on file   Highest education  level: Master's degree (e.g., MA, MS, MEng, MEd, MSW, MBA)  Occupational History    Comment: retired Runner, broadcasting/film/video  Tobacco Use   Smoking status: Never   Smokeless tobacco: Never  Vaping Use   Vaping status: Never Used  Substance and Sexual Activity   Alcohol use: Never   Drug use: Never   Sexual activity: Not on file  Other Topics Concern   Not on file  Social History Narrative   Lives with brother   Caffeine- 8-10 oz daily   Social Drivers of Health   Financial Resource Strain: Low Risk  (09/16/2023)   Overall Financial Resource Strain (CARDIA)    Difficulty of Paying Living Expenses: Not hard at all  Food Insecurity: No Food Insecurity (09/16/2023)   Hunger Vital Sign    Worried About Running Out of Food in the Last Year: Never true    Ran Out of Food in the Last Year: Never true  Transportation Needs: No Transportation Needs (09/16/2023)   PRAPARE - Administrator, Civil Service (Medical): No    Lack of Transportation (Non-Medical): No  Physical Activity: Insufficiently Active (09/16/2023)   Exercise Vital Sign    Days of Exercise per Week: 5 days    Minutes of Exercise per Session: 20 min  Stress: Stress Concern Present (09/16/2023)   [employer] of Occupational Health - Occupational Stress Questionnaire    Feeling of Stress: Rather much  Social Connections: Moderately Isolated (09/16/2023)   Social Connection and Isolation Panel    Frequency of Communication with Friends and Family: More than three times a week    Frequency of Social Gatherings with Friends and Family: More than three times a week    Attends Religious Services: 1 to 4 times per year    Active Member of [employer] or Organizations: No    Attends Banker Meetings: Never    Marital Status: Divorced  Catering manager Violence: Not At Risk (09/16/2023)   Humiliation, Afraid, Rape, and Kick questionnaire    Fear of Current or Ex-Partner: No    Emotionally Abused: No    Physically Abused:  No    Sexually Abused: No     PHYSICAL EXAM  GENERAL EXAM/CONSTITUTIONAL: Vitals:  Vitals:   11/06/23 0955 11/06/23 0958  BP: (!) 175/84 (!) 161/81  Pulse: 75 75  Weight: 150 lb (68 kg)   Height: 5' 4 (1.626 m)    Body mass index is 25.75 kg/m. Wt Readings from Last 3 Encounters:  11/06/23 150 lb (68 kg)  10/08/23 148 lb 8 oz (67.4 kg)  09/16/23 150 lb 12.8 oz (68.4 kg)   Patient is in no distress; well developed, nourished and groomed; neck is supple  CARDIOVASCULAR: Examination of carotid arteries is normal;  no carotid bruits Regular rate and rhythm, no murmurs Examination of peripheral vascular system by observation and palpation is normal  EYES: Ophthalmoscopic exam of optic discs and posterior segments is normal; no papilledema or hemorrhages No results found.  MUSCULOSKELETAL: Gait, strength, tone, movements noted in Neurologic exam below  NEUROLOGIC: MENTAL STATUS:     09/20/2021    3:23 PM 09/15/2019    2:58 PM  MMSE - Mini Mental State Exam  Orientation to time 5 5  Orientation to Place 5 5  Registration 3 3  Attention/ Calculation 5 5  Recall 3 3  Language- name 2 objects 2 2  Language- repeat 1 1  Language- follow 3 step command 3 3  Language- read & follow direction 1 1  Write a sentence 1 0  Write a sentence-comments  no subject  Copy design 1 1  Total score 30 29   awake, alert, oriented to person, place and time recent and remote memory intact normal attention and concentration language fluent, comprehension intact, naming intact fund of knowledge appropriate  CRANIAL NERVE:  2nd - no papilledema on fundoscopic exam 2nd, 3rd, 4th, 6th - pupils equal and reactive to light, visual fields full to confrontation, extraocular muscles intact, no nystagmus 5th - facial sensation symmetric 7th - facial strength symmetric 8th - hearing intact 9th - palate elevates symmetrically, uvula midline 11th - shoulder shrug symmetric 12th - tongue  protrusion midline  MOTOR:  normal bulk and tone, full strength in the BUE, BLE  SENSORY:  normal and symmetric to light touch, temperature, vibration  COORDINATION:  finger-nose-finger, fine finger movements normal  REFLEXES:  deep tendon reflexes present and symmetric  GAIT/STATION:  narrow based gait     DIAGNOSTIC DATA (LABS, IMAGING, TESTING) - I reviewed patient records, labs, notes, testing and imaging myself where available.  Lab Results  Component Value Date   WBC 8.3 10/08/2023   HGB 13.8 10/08/2023   HCT 40.3 10/08/2023   MCV 89.9 10/08/2023   PLT 244.0 10/08/2023      Component Value Date/Time   NA 138 10/08/2023 1048   K 4.0 10/08/2023 1048   CL 103 10/08/2023 1048   CO2 26 10/08/2023 1048   GLUCOSE 79 10/08/2023 1048   BUN 16 10/08/2023 1048   CREATININE 0.81 10/08/2023 1048   CREATININE 0.73 11/01/2022 1229   CALCIUM 9.5 10/08/2023 1048   PROT 8.3 10/08/2023 1048   ALBUMIN 4.9 10/08/2023 1048   AST 34 10/08/2023 1048   ALT 27 10/08/2023 1048   ALKPHOS 68 10/08/2023 1048   BILITOT 0.7 10/08/2023 1048   GFRNONAA >60 07/12/2023 1223   GFRNONAA 74 02/09/2020 1640   GFRAA 85 02/09/2020 1640   Lab Results  Component Value Date   CHOL 214 (H) 10/08/2023   HDL 32.10 (L) 10/08/2023   LDLCALC 137 (H) 10/08/2023   TRIG 221.0 (H) 10/08/2023   CHOLHDL 7 10/08/2023   Lab Results  Component Value Date   HGBA1C 5.1 07/12/2023   Lab Results  Component Value Date   VITAMINB12 317 06/26/2022   Lab Results  Component Value Date   TSH 2.18 10/08/2023   07/12/23 MRI brain (without) [I reviewed images myself and agree with interpretation. -VRP]  1. No acute intracranial abnormality. 2. Mild chronic microvascular ischemic disease for age.  07/13/23 CTA head / neck -No large vessel occlusion. - No high-grade stenosis, aneurysm, or dissection of the arteries in the head and neck. - Mild atherosclerosis as above. - No  CT evidence of acute intracranial  abnormality. - Aortic Atherosclerosis (ICD10-I70.0).   ASSESSMENT AND PLAN  78 y.o. year old female here with:  Dx:  1. TIA (transient ischemic attack)   2. Complicated migraine   3. Anxiety      PLAN:  78 y.o. right handed female with history of hypertension, hyperlipidemia and ocular migraines admitted for aphasia and vision changes.  Patient states that she first had her typical ocular migraine vision changes with her vision growing dark and seeing some electrical spots in her vision, but she did not have a headache at that time.  She returned home and waited for the visual symptoms to pass.  She attempted to call her doctor to discuss the symptoms but was having difficulty getting her words out while she was on the phone and while talking to her brother at home.  Her brother then called EMS, and she was brought to the emergency department.  Symptoms resolved prior to arrival.  MRI was negative for stroke.  NIH on Admission 0    TRANSIENT VISION DISTURBANCE + APHASIA (possible TIA vs complicated migraine phenomenon) Code Stroke CT head No acute abnormality.  Left choroidal fissure cyst CTA head & neck pending (ok to obtain scan with current labs, creatinine normal) MRI no acute abnormality, mild chronic microvascular ischemic disease 2D Echo EF 60 to 65%, grade 1 diastolic dysfunction, trivial mitral valve regurgitation, normal left atrial size, no atrial level shunt LDL 141 HgbA1c 5.1 No antithrombotic prior to admission, then on clopidogrel  75 mg daily from hospital, but now stopped on 08/22/23 due to itching; consider to restart to see if this a true reaction or another cause   Hypertension Continue olmesartan ; may need to increase dose per PCP   Hyperlipidemia LDL 141 --> 137, goal < 70 Recommend to start statin or zetia    ESSENTIAL TREMOR - monitor; consider metoprolol  or primidone if symptoms worsen  ANXIETY / medication intolerance / fear of medication - referral  to psychology for eval and treatment  Orders Placed This Encounter  Procedures   Ambulatory referral to Psychology   Return for return to PCP, pending if symptoms worsen or fail to improve.  I spent 60 minutes of face-to-face and non-face-to-face time with patient.  This included previsit chart review, lab review, study review, order entry, electronic health record documentation, patient education.    EDUARD FABIENE HANLON, MD 11/06/2023, 11:02 AM Certified in Neurology, Neurophysiology and Neuroimaging  [facility] [address], [address] [place], [place] [address] [phone]

## 2023-11-06 NOTE — Patient Instructions (Addendum)
  TRANSIENT VISION DISTURBANCE + APHASIA (possible TIA vs complicated migraine phenomenon) No antithrombotic prior to admission, then on clopidogrel  75 mg daily from hospital, but now stopped on 08/22/23 due to itching; consider to restart to see if this a true reaction or another cause   Hypertension Continue olmesartan ; may need to increase dose per PCP   Hyperlipidemia LDL 141 --> 137, goal < 70 Recommend to start statin or zetia    ANXIETY / medication intolerance / fear of medication - consider referral to psychology for eval and treatment

## 2023-11-07 ENCOUNTER — Telehealth: Payer: Self-pay

## 2023-11-07 ENCOUNTER — Other Ambulatory Visit: Payer: Self-pay

## 2023-11-07 DIAGNOSIS — R0989 Other specified symptoms and signs involving the circulatory and respiratory systems: Secondary | ICD-10-CM

## 2023-11-07 MED ORDER — OLMESARTAN MEDOXOMIL 20 MG PO TABS
20.0000 mg | ORAL_TABLET | Freq: Every day | ORAL | 0 refills | Status: DC
Start: 2023-11-07 — End: 2024-01-13

## 2023-11-07 NOTE — Telephone Encounter (Signed)
 RX sent.   Copied from CRM [id number]. Topic: Clinical - Prescription Issue >> Nov 07, 2023  9:58 AM Chasity T wrote: Reason for CRM: Dorothyann from [facility] is calling to request an update prescription to be sent to pharmacy for olmesartan  (BENICAR ) 20 MG tablet. She states patient advised that pcp changed the way it is to be taken so they are needing an updated prescription to be sent over so they can fill her medication before going on vacation.

## 2023-11-08 ENCOUNTER — Ambulatory Visit: Admitting: Diagnostic Neuroimaging

## 2023-11-20 ENCOUNTER — Encounter (INDEPENDENT_AMBULATORY_CARE_PROVIDER_SITE_OTHER): Admitting: Ophthalmology

## 2023-11-20 DIAGNOSIS — H353221 Exudative age-related macular degeneration, left eye, with active choroidal neovascularization: Secondary | ICD-10-CM | POA: Diagnosis not present

## 2023-11-20 DIAGNOSIS — H35033 Hypertensive retinopathy, bilateral: Secondary | ICD-10-CM

## 2023-11-20 DIAGNOSIS — H43813 Vitreous degeneration, bilateral: Secondary | ICD-10-CM

## 2023-11-20 DIAGNOSIS — I10 Essential (primary) hypertension: Secondary | ICD-10-CM | POA: Diagnosis not present

## 2024-01-13 ENCOUNTER — Other Ambulatory Visit: Payer: Self-pay

## 2024-01-13 ENCOUNTER — Ambulatory Visit: Admitting: Family

## 2024-01-13 ENCOUNTER — Encounter: Payer: Self-pay | Admitting: Family

## 2024-01-13 ENCOUNTER — Other Ambulatory Visit (HOSPITAL_BASED_OUTPATIENT_CLINIC_OR_DEPARTMENT_OTHER): Payer: Self-pay

## 2024-01-13 VITALS — BP 162/82 | HR 103 | Temp 97.5°F | Ht 64.0 in | Wt 147.6 lb

## 2024-01-13 DIAGNOSIS — E041 Nontoxic single thyroid nodule: Secondary | ICD-10-CM | POA: Diagnosis not present

## 2024-01-13 DIAGNOSIS — N959 Unspecified menopausal and perimenopausal disorder: Secondary | ICD-10-CM | POA: Insufficient documentation

## 2024-01-13 DIAGNOSIS — R5383 Other fatigue: Secondary | ICD-10-CM

## 2024-01-13 DIAGNOSIS — R0989 Other specified symptoms and signs involving the circulatory and respiratory systems: Secondary | ICD-10-CM | POA: Diagnosis not present

## 2024-01-13 DIAGNOSIS — R454 Irritability and anger: Secondary | ICD-10-CM

## 2024-01-13 MED ORDER — IRBESARTAN 150 MG PO TABS
150.0000 mg | ORAL_TABLET | Freq: Every day | ORAL | 1 refills | Status: DC
  Filled 2024-01-13: qty 30, 30d supply, fill #0

## 2024-01-13 MED ORDER — IRBESARTAN 150 MG PO TABS: 150.0000 mg | ORAL_TABLET | Freq: Every day | ORAL | 1 refills | Status: DC

## 2024-01-13 NOTE — Addendum Note (Signed)
 Addended by: CRISTOPHER MAUS C on: 01/13/2024 02:52 PM   Modules accepted: Orders

## 2024-01-13 NOTE — Progress Notes (Signed)
 Patient ID: Margaret Sullivan, female    DOB: Jun 28, 1945, 78 y.o.   MRN: [id number]  Chief Complaint  Patient presents with   Hypertension    3 month follow-up for hypertension. The lowest she has had since last appointment was 141/79 in Oct. Has some concerns, had appointment with Neuro agreed with [facility] to increase her olmesartan  from  1/2 a pill once a day to  1/2 a pill in the morning and 1/2 a pill at night. Neuro also suggested revisiting Plavix  because she was also taking an antibiotic during that same time period.  Has not increase the olmesartan  yet because she was traveling and having mood issues on med.   Discussed the use of AI scribe software for clinical note transcription with the patient, who gave verbal consent to proceed.  History of Present Illness Margaret Sullivan is a 78 year old female who presents with extreme anxiety and irritability.  She experiences intense anxiety and irritability, particularly towards her brother, with increased agitation and fatigue since June. She has difficulty sleeping, with only two hours of sleep the night before the visit. Dizziness occurs when making quick turns or standing up, and she feels queasy when turning over in bed. She takes olmesartan  10 mg at 7 PM for hypertension but has not increased the dose due to concerns about side effects. She previously discontinued losartan  due to decreased potassium and syncope. She experiences fatigue and lacks confidence in accomplishing tasks, which is worsened by her current symptoms. Her social interactions are limited, and she feels overwhelmed by the idea of becoming more social.  Assessment & Plan Hypertension BP elevated with olmesartan  20 mg daily, did not increase dose to bid as advised. Increased lightheadedness and queasiness possibly related to medication. Discussed side effects of olmesartan  and considering switching to irbesartan (friend takes and likes) or lisinopril. - Switched from  olmesartan  to irbesartan (Avapro) 150mg  qd, start today. - Monitor blood pressure and symptoms. - Recheck blood pressure in 1 month.  Nontoxic thyroid  nodule Thyroid  nodules with normal thyroid  function tests. No symptoms attributed to nodules. Discussed monitoring importance. - Order ultrasound to recheck thyroid  nodules  Anxiety and irritability Increased anxiety and irritability possibly related to postmenopausal state. Discussed counseling, therapy, and herbal supplements. - Recommended counseling or therapy, including options like [facility] or  health (has contact info for both & will call) - Suggested OTC Estroven herbal supplement (or similar) for menopausal symptoms and irritability. - Encouraged social interaction and stress-reducing activities.  Fatigue Persistent fatigue possibly related to hypertension medication. Discussed dietary modifications, exercise, and vitamin D  supplementation. - Recommended dietary modifications: increase protein intake, reduce processed foods and carbohydrates. - Encouraged regular exercise, including cardio, to improve energy levels. - Recommended vitamin D  supplementation (4000 units daily).  Postmenopausal state Symptoms include irritability, anger, hot flashes. - Recommended Estroven supplement fatigue. Discussed benefits of herbal supplements and lifestyle modifications for menopausal symptoms. - Encouraged lifestyle modifications including diet and exercise.  Subjective:    Outpatient Medications Prior to Visit  Medication Sig Dispense Refill   Ascorbic Acid  (VITAMIN C ) 500 MG CAPS Take 500 mg by mouth daily.     betamethasone dipropionate (DIPROLENE) 0.05 % ointment Apply 1 Application topically as needed (outbreaks).     Bevacizumab  (AVASTIN  IV) Inject into the vein. Every 16 weeks eye injection - left eye   Changed from 12 weeks per patient.     olmesartan  (BENICAR ) 20 MG tablet Take 1 tablet (20  mg  total) by mouth daily. 180 tablet 0   trimethoprim-polymyxin b (POLYTRIM) ophthalmic solution Place 4 drops into the left eye every 4 (four) hours. One drop into Left eye 4 times a day.     Cholecalciferol (D3 PO) Take 1 capsule by mouth daily. (Patient not taking: Reported on 01/13/2024)     No facility-administered medications prior to visit.   Past Medical History:  Diagnosis Date   Abnormal glucose    Allergy    Essential tremor    GERD (gastroesophageal reflux disease)    Heart murmur    High cholesterol    Hyperlipidemia    Hypertension    labile   Labile hypertension    Medication management 06/10/2013   Prediabetes    Past Surgical History:  Procedure Laterality Date   BACK SURGERY  2001   COLONOSCOPY     TONSILLECTOMY AND ADENOIDECTOMY Bilateral 1950   UPPER GASTROINTESTINAL ENDOSCOPY     WISDOM TOOTH EXTRACTION Bilateral 1969   Allergies  Allergen Reactions   Ampicillin Hives and Swelling   Aspirin Itching    Spoke with patient and she endorsed itching on the top of her head when she took ASA awhile back. Allergy was listed in 2015.   Doxycycline  Nausea And Vomiting   Keflex [Cephalexin] Hives    Itching   Macrodantin [Nitrofurantoin Macrocrystal] Hives, Itching and Swelling   Percocet [Oxycodone-Acetaminophen ]    Plavix  [Clopidogrel ] Itching   Propulsid [Cisapride] Itching and Swelling   Voltaren [Diclofenac] Hives and Itching   Codeine Palpitations   Penicillins Rash, Swelling and Hives      Objective:    Physical Exam Vitals and nursing note reviewed.  Constitutional:      Appearance: Normal appearance.  Cardiovascular:     Rate and Rhythm: Normal rate and regular rhythm.  Pulmonary:     Effort: Pulmonary effort is normal.     Breath sounds: Normal breath sounds.  Musculoskeletal:        General: Normal range of motion.  Skin:    General: Skin is warm and dry.  Neurological:     Mental Status: She is alert.  Psychiatric:        Mood and  Affect: Mood normal.        Behavior: Behavior normal.    BP (!) 158/82   Pulse (!) 103   Temp (!) 97.5 F (36.4 C) (Temporal)   Ht 5' 4 (1.626 m)   Wt 147 lb 9.6 oz (67 kg)   SpO2 94%   BMI 25.34 kg/m  Wt Readings from Last 3 Encounters:  01/13/24 147 lb 9.6 oz (67 kg)  11/06/23 150 lb (68 kg)  10/08/23 148 lb 8 oz (67.4 kg)   *I personally spent a total of 30 minutes in the care of the patient today including preparing to see the patient, getting/reviewing separately obtained history, performing a medically appropriate exam/evaluation, counseling and educating, placing orders, and documenting clinical information in the EHR.     Lucius Krabbe, NP

## 2024-01-13 NOTE — Patient Instructions (Addendum)
 It was very nice to see you today!  I have sent in your new blood pressure medicine, Irbesartan, start this today. Schedule a 4 week follow up visit. If you would like another option for counseling/therapy you can try [facility] at [phone]. Over the counter vitamin options are below: Estroven has a combination of black cohosh, and other herbs and vitamins to help with a combination of symptoms. Magnesium glycinate, L-threonate, or taurate can help with anxiety, maintaining sleep (if taken at night), muscle recovery, good bowel function, hot flashes, and maintaining blood pressure.  For magnesium look for chelated form (better absorbability) and for any over the counter supplement or vitamin, look for organic or has a 3rd party seal from [employer], [employer] or US[employer].  This authenticates the quality but not the efficacy (since not FDA approved).   Be sure you are taking Vitamin D3 every day, up to 4000 units.   PLEASE NOTE:  If you had any lab tests please let us  know if you have not heard back within a few days. You may see your results on MyChart before we have a chance to review them but we will give you a call once they are reviewed by us . If we ordered any referrals today, please let us  know if you have not heard from their office within the next week.

## 2024-01-14 ENCOUNTER — Other Ambulatory Visit (HOSPITAL_BASED_OUTPATIENT_CLINIC_OR_DEPARTMENT_OTHER): Payer: Self-pay

## 2024-01-27 ENCOUNTER — Other Ambulatory Visit

## 2024-02-10 ENCOUNTER — Ambulatory Visit: Admitting: Family

## 2024-02-10 ENCOUNTER — Encounter: Payer: Self-pay | Admitting: Family

## 2024-02-10 VITALS — BP 138/70 | HR 88 | Temp 97.9°F | Ht 64.0 in | Wt 152.1 lb

## 2024-02-10 DIAGNOSIS — R0989 Other specified symptoms and signs involving the circulatory and respiratory systems: Secondary | ICD-10-CM

## 2024-02-10 DIAGNOSIS — F418 Other specified anxiety disorders: Secondary | ICD-10-CM

## 2024-02-10 MED ORDER — IRBESARTAN 75 MG PO TABS: 75.0000 mg | ORAL_TABLET | Freq: Two times a day (BID) | ORAL | 2 refills | Status: DC

## 2024-02-10 NOTE — Progress Notes (Signed)
 Patient ID: Margaret Sullivan, female    DOB: [birth year], 78 y.o.   MRN: [id number]  Chief Complaint  Patient presents with   Hypertension    Follow up  Discussed the use of AI scribe software for clinical note transcription with the patient, who gave verbal consent to proceed.  History of Present Illness Margaret Sullivan is a 78 year old female with hypertension who presents with concerns about blood pressure management and medication side effects.  She reports difficulty with blood pressure control at home, with inconsistent readings on two machines and diastolic pressures in the 80s instead of her usual 70s. She recently changed from olmesartan  20 mg bid to irbesartan  150 mg daily. Since then, her anger has improved but she has increased emotional sensitivity with frequent crying, bloating, and gastrointestinal discomfort from the abdomen down the leg. She also notes a new odor to urine and bowel movements since starting irbesartan . She has fatty liver and is worried the new bloating could be related to her liver. She has not started new over-the-counter medications. She has new sleep disturbance, waking at 2:30 AM, which she attributes to stress and emotional changes. She has used hydroxyzine  as needed for itching, but not anxiety. She feels continued significant stress related to frequent demands from a neighbor, which affects her ability to manage her own tasks. She Has tried to ask the neighbor's out of town family for help. She reports increased warmth and sweating at night and dry mouth, which she believes may be related to her medications.  Assessment & Plan Labile hypertension Blood pressure remains elevated with diastolic readings in the 80s. Stress and situational factors may contribute. Current irbesartan  may cause gastrointestinal side effects, doubtful causing stress symptoms. Discussed potential switch to ACE inhibitors but noted concerns about dry cough and angioedema. Irbesartan   provides kidney protection. - Adjusted irbesartan  to 75 mg twice daily. - Sent prescription for 75 mg irbesartan  bid. - Monitor blood pressure at home and report via MyChart any continued elevations. - F/U in 2 mos  Situational anxiety with insomnia Experiencing anxiety and insomnia, likely due to stress from family responsibilities. Hydroxyzine  may aid sleep. Discussed sleep hygiene. - Use hydroxyzine  10 mg as needed for anxiety and sleep, up to three tablets at night if needed. - Maintain good sleep hygiene, including a dark, cool room and white noise.  Cerumen impaction with ear pruritus Reports ear pruritus and cerumen impaction. Advised against using Q-tips deep in the ear canal. - Use mineral oil drops in ears overnight with a cotton ball to loosen wax. - Apply a thin layer of over-the-counter hydrocortisone cream to the outer ear for itching.   Subjective:    Outpatient Medications Prior to Visit  Medication Sig Dispense Refill   Ascorbic Acid  (VITAMIN C ) 500 MG CAPS Take 500 mg by mouth daily.     betamethasone dipropionate (DIPROLENE) 0.05 % ointment Apply 1 Application topically as needed (outbreaks).     Bevacizumab  (AVASTIN  IV) Inject into the vein. Every 16 weeks eye injection - left eye   Changed from 12 weeks per patient.     Cholecalciferol (D3 PO) Take 1 capsule by mouth daily.     irbesartan  (AVAPRO ) 150 MG tablet Take 1 tablet (150 mg total) by mouth daily. 30 tablet 1   trimethoprim-polymyxin b (POLYTRIM) ophthalmic solution Place 4 drops into the left eye every 4 (four) hours. One drop into Left eye 4 times a day.     No facility-administered  medications prior to visit.   Past Medical History:  Diagnosis Date   Abnormal glucose    Allergy    Essential tremor    GERD (gastroesophageal reflux disease)    Heart murmur    High cholesterol    Hyperlipidemia    Hypertension    labile   Labile hypertension    Medication management 06/10/2013   Prediabetes     Past Surgical History:  Procedure Laterality Date   BACK SURGERY  2001   COLONOSCOPY     TONSILLECTOMY AND ADENOIDECTOMY Bilateral 1950   UPPER GASTROINTESTINAL ENDOSCOPY     WISDOM TOOTH EXTRACTION Bilateral 1969   Allergies  Allergen Reactions   Ampicillin Hives and Swelling   Aspirin Itching    Spoke with patient and she endorsed itching on the top of her head when she took ASA awhile back. Allergy was listed in 2015.   Doxycycline  Nausea And Vomiting   Keflex [Cephalexin] Hives    Itching   Macrodantin [Nitrofurantoin Macrocrystal] Hives, Itching and Swelling   Percocet [Oxycodone-Acetaminophen ]    Plavix  [Clopidogrel ] Itching   Propulsid [Cisapride] Itching and Swelling   Voltaren [Diclofenac] Hives and Itching   Codeine Palpitations   Penicillins Rash, Swelling and Hives      Objective:    Physical Exam Vitals and nursing note reviewed.  Constitutional:      Appearance: Normal appearance.  HENT:     Right Ear: No middle ear effusion. There is no impacted cerumen. Tympanic membrane is not erythematous.     Left Ear:  No middle ear effusion. There is no impacted cerumen. Tympanic membrane is not erythematous.     Ears:     Comments: mild cerumen noted bilateral ear canal, no erythema or scaling noted. Cardiovascular:     Rate and Rhythm: Normal rate and regular rhythm.  Pulmonary:     Effort: Pulmonary effort is normal.     Breath sounds: Normal breath sounds.  Musculoskeletal:        General: Normal range of motion.  Skin:    General: Skin is warm and dry.  Neurological:     Mental Status: She is alert.  Psychiatric:        Mood and Affect: Mood normal.        Behavior: Behavior normal.    BP 138/70 (BP Location: Left Arm, Patient Position: Sitting, Cuff Size: Large)   Pulse 88   Temp 97.9 F (36.6 C) (Temporal)   Ht 5' 4 (1.626 m)   Wt 152 lb 2 oz (69 kg)   SpO2 96%   BMI 26.11 kg/m  Wt Readings from Last 3 Encounters:  02/10/24 152 lb 2 oz (69  kg)  01/13/24 147 lb 9.6 oz (67 kg)  11/06/23 150 lb (68 kg)   *I personally spent a total of 35 minutes in the care of the patient today including preparing to see the patient, getting/reviewing separately obtained history, performing a medically appropriate exam/evaluation, counseling and educating, placing orders, and documenting clinical information in the EHR.     Lucius Krabbe, NP

## 2024-03-12 ENCOUNTER — Ambulatory Visit: Payer: Self-pay

## 2024-03-12 NOTE — Telephone Encounter (Signed)
 noted, does not need earlier appt

## 2024-03-12 NOTE — Telephone Encounter (Signed)
 FYI Only or Action Required?: FYI only for provider: appointment scheduled on 1/12.  Patient was last seen in primary care on 02/10/2024 by Lucius Krabbe, NP.  Called Nurse Triage reporting Hand Pain and Neurologic Problem.  Symptoms began several days ago.  Interventions attempted: Nothing.  Symptoms are: gradually worsening.  Triage Disposition: No disposition on file.  Patient/caregiver understands and will follow disposition?:     Copied from CRM [id number]. Topic: Clinical - Red Word Triage >> Mar 12, 2024 11:42 AM Zy'onna H wrote: [facility] that prompted transfer to Nurse Triage:  Patient having tingling and joint pain- started throughout the Holidays - Knee/Shoulder/Clavicle/Fingers/Right Hand  Looking to be seen by PCP - ASAP  WM to NT Reason for Disposition  [1] Numbness or tingling on both sides of body AND [2] is a new symptom present > 24 hours  Answer Assessment - Initial Assessment Questions 1. SYMPTOM: What is the main symptom you are concerned about? (e.g., weakness, numbness)     Hand cramping 2. ONSET: When did this start? (e.g., minutes, hours, days; while sleeping)     Several weeks ago 3. LAST NORMAL: When was the last time you (the patient) were normal (no symptoms)?     Several weeks 4. PATTERN Does this come and go, or has it been constant since it started?  Is it present now?     intermittent 5. CARDIAC SYMPTOMS: Have you had any of the following symptoms: chest pain, difficulty breathing, palpitations?     denies 6. NEUROLOGIC SYMPTOMS: Have you had any of the following symptoms: headache, dizziness, vision loss, double vision, changes in speech, unsteady on your feet?      mild dizziness when standing, Tingling in tips of fingers bilat.  7. OTHER SYMPTOMS: Do you have any other symptoms?     no  Protocols used: Neurologic Deficit-A-AH

## 2024-03-13 NOTE — Telephone Encounter (Signed)
 Reviewed

## 2024-03-16 ENCOUNTER — Encounter: Payer: Self-pay | Admitting: Family

## 2024-03-16 ENCOUNTER — Ambulatory Visit: Admitting: Family

## 2024-03-16 VITALS — BP 112/72 | HR 80 | Temp 97.9°F | Ht 64.0 in | Wt 151.4 lb

## 2024-03-16 DIAGNOSIS — E782 Mixed hyperlipidemia: Secondary | ICD-10-CM

## 2024-03-16 DIAGNOSIS — R202 Paresthesia of skin: Secondary | ICD-10-CM

## 2024-03-16 DIAGNOSIS — I700 Atherosclerosis of aorta: Secondary | ICD-10-CM

## 2024-03-16 DIAGNOSIS — R200 Anesthesia of skin: Secondary | ICD-10-CM | POA: Diagnosis not present

## 2024-03-16 DIAGNOSIS — R0989 Other specified symptoms and signs involving the circulatory and respiratory systems: Secondary | ICD-10-CM | POA: Diagnosis not present

## 2024-03-16 DIAGNOSIS — R5383 Other fatigue: Secondary | ICD-10-CM

## 2024-03-16 MED ORDER — IRBESARTAN 75 MG PO TABS: 75.0000 mg | ORAL_TABLET | ORAL | Status: AC

## 2024-03-16 NOTE — Progress Notes (Unsigned)
 "  Patient ID: Margaret Sullivan, female    DOB: 06/16/1945, 79 y.o.   MRN: [id number]  Chief Complaint  Patient presents with   Muscle Pain    Pt c/o cramping in right hand and forearm. Pt also c/o joint pain all over.    Hypertension    Follow up, patient is taking irbesartan  75 mg daily.    Fatigue  Discussed the use of AI scribe software for clinical note transcription with the patient, who gave verbal consent to proceed.  History of Present Illness Margaret Sullivan is a 79 year old female with hypertension and history of TIA who presents with medication side effects and blood pressure management issues.  She has severe cramping, fatigue, nausea, and positional dizziness on her blood pressure medication. She initially took 150 mg twice daily but reduced to 75 mg in the morning due to these symptoms. Her home blood pressure readings are inconsistent, and she has difficulty using the machine, so her brother often helps.  She is worried about preventing another TIA and has had side effects on multiple blood pressure medications, including losartan , olmesartan , and irbesartan . Fatigue significantly limits her daily activities. She has not started the previously suggested hydroxyzine  for sleep.  Since February she has had hearing loss, which she worries is from Avastin . ENT did not find middle ear fluid. She has anxiety and a persistent sensation that her ears feel closed off, which she finds very distressing.  She reports new tingling and cramping mainly in the right hand, with joint pain in her fingers and shoulders. A prior cold injury of the knee flares at times. She eats bananas and mustard for cramps, thinking she might be low on potassium, though her potassium was 4.0 and normal in August.  She has high cholesterol, arteriosclerosis of the aorta, and fatty liver, which are being considered in the context of her blood pressure and TIA risk management today.  Assessment & Plan Labile  hypertension Blood pressure uncontrolled with irbesartan . Symptoms improved with dose reduction. Fatigue persists, possibly due to blood pressure fluctuations. Previous medications not tolerated. Discussed telmisartan or amlodipine; she prefers to avoid amlodipine. Emphasized blood pressure control to prevent TIA recurrence or new CVA. - Continue irbesartan  75 mg in the morning. - Add half of a 75 mg irbesartan  tablet in the evening. - Monitor blood pressure readings and symptoms. - Discuss potential switch to telmisartan if current regimen is ineffective.  Mixed hyperlipidemia Cholesterol levels high, increasing TIA and stroke risk. Previous statin discussions not pursued due to patient reluctance to treat with medication. Discussed coronary calcium score test and Repatha as statin alternative. - Will consider coronary calcium score test to assess cardiovascular risk. - Will discuss potential use of Repatha as an alternative to oral statins.  Atherosclerosis of aorta Previous CT showed aortic atherosclerosis. Discussed complications and monitoring needs. - Continue monitoring atherosclerosis of the aorta.  Other fatigue Persistent fatigue possibly due to blood pressure fluctuations and medication side effects, but has persisted after several BP med changes. Discussed ongoing sleep issues and anxiety as possible causes. Hydroxyzine  recommended for sleep in past, but not tried. - Try hydroxyzine  10-20mg  at night to improve sleep quality. - Encouraged regular exercise to improve energy levels.  Bilateral hand pain/tingling New bilateral hand pain and tingling, more in right hand. Differential includes carpal tunnel syndrome, cervical/shoulder radiculopathy, or age-related nerve changes. Medication cause unlikely. Discussed potential EMG study. - Will consider referral for EMG study to evaluate nerve function. -  Monitor symptoms and report any changes.  Subjective:    Outpatient Medications  Prior to Visit  Medication Sig Dispense Refill   Ascorbic Acid  (VITAMIN C ) 500 MG CAPS Take 500 mg by mouth daily.     betamethasone dipropionate (DIPROLENE) 0.05 % ointment Apply 1 Application topically as needed (outbreaks).     Bevacizumab  (AVASTIN  IV) Inject into the vein. Every 16 weeks eye injection - left eye   Changed from 12 weeks per patient.     Cholecalciferol (D3 PO) Take 1 capsule by mouth daily.     irbesartan  (AVAPRO ) 75 MG tablet Take 1 tablet (75 mg total) by mouth 2 (two) times daily. (Patient taking differently: Take 75 mg by mouth daily.) 60 tablet 2   trimethoprim-polymyxin b (POLYTRIM) ophthalmic solution Place 4 drops into the left eye every 4 (four) hours. One drop into Left eye 4 times a day.     irbesartan  (AVAPRO ) 150 MG tablet Take 75 mg by mouth 2 (two) times daily.     No facility-administered medications prior to visit.   Past Medical History:  Diagnosis Date   Abnormal glucose    Allergy    Essential tremor    GERD (gastroesophageal reflux disease)    Heart murmur    High cholesterol    Hyperlipidemia    Hypertension    labile   Labile hypertension    Medication management 06/10/2013   Prediabetes    Past Surgical History:  Procedure Laterality Date   BACK SURGERY  2001   COLONOSCOPY     TONSILLECTOMY AND ADENOIDECTOMY Bilateral 1950   UPPER GASTROINTESTINAL ENDOSCOPY     WISDOM TOOTH EXTRACTION Bilateral 1969   Allergies[1]    Objective:    Physical Exam Vitals and nursing note reviewed.  Constitutional:      Appearance: Normal appearance.  Cardiovascular:     Rate and Rhythm: Normal rate and regular rhythm.  Pulmonary:     Effort: Pulmonary effort is normal.     Breath sounds: Normal breath sounds.  Musculoskeletal:        General: Normal range of motion.  Skin:    General: Skin is warm and dry.  Neurological:     Mental Status: She is alert.  Psychiatric:        Mood and Affect: Mood normal.        Behavior: Behavior  normal.    BP 112/72 (BP Location: Left Arm, Patient Position: Sitting, Cuff Size: Normal)   Pulse 80   Temp 97.9 F (36.6 C) (Temporal)   Ht 5' 4 (1.626 m)   Wt 151 lb 6.4 oz (68.7 kg)   SpO2 97%   BMI 25.99 kg/m  Wt Readings from Last 3 Encounters:  03/16/24 151 lb 6.4 oz (68.7 kg)  02/10/24 152 lb 2 oz (69 kg)  01/13/24 147 lb 9.6 oz (67 kg)      Jennaya Pogue, NP     [1]  Allergies Allergen Reactions   Ampicillin Hives and Swelling   Aspirin Itching    Spoke with patient and she endorsed itching on the top of her head when she took ASA awhile back. Allergy was listed in 2015.   Doxycycline  Nausea And Vomiting   Keflex [Cephalexin] Hives    Itching   Macrodantin [Nitrofurantoin Macrocrystal] Hives, Itching and Swelling   Percocet [Oxycodone-Acetaminophen ]    Plavix  [Clopidogrel ] Itching   Propulsid [Cisapride] Itching and Swelling   Voltaren [Diclofenac] Hives and Itching   Codeine Palpitations  Penicillins Rash, Swelling and Hives   "

## 2024-03-16 NOTE — Patient Instructions (Addendum)
 It was very nice to see you today!  Continue to take the Irbesartan  every morning. And take 1/2 pill in the evening to help better control your blood pressure over 24 hours. Let me know if you are still having readings higher than 140/90 either number.  I do not believe any of your current meds are causing tingling or pain in your hands, this is usually due to overuse that can lead to carpal tunnel syndrome or arthritis. We can check a nerve conduction study via Orthopedic office if this continues to be bothersome.  Be sure you are taking the Hydroxyzine  at bedtime, 1-2 pills to help with sleep and alleviate fatigue during the day. Need to get back to a daily exercise regimen to also fight fatigue.  Talk with Dr Alvia about the Avastin , since having continued fatigue and hearing trouble.  Schedule a 2 month follow up visit.   PLEASE NOTE:  If you had any lab tests please let us  know if you have not heard back within a few days. You may see your results on MyChart before we have a chance to review them but we will give you a call once they are reviewed by us . If we ordered any referrals today, please let us  know if you have not heard from their office within the next week.

## 2024-03-18 ENCOUNTER — Encounter (INDEPENDENT_AMBULATORY_CARE_PROVIDER_SITE_OTHER): Admitting: Ophthalmology

## 2024-03-18 DIAGNOSIS — H353221 Exudative age-related macular degeneration, left eye, with active choroidal neovascularization: Secondary | ICD-10-CM | POA: Diagnosis not present

## 2024-03-18 DIAGNOSIS — H2513 Age-related nuclear cataract, bilateral: Secondary | ICD-10-CM

## 2024-03-18 DIAGNOSIS — I10 Essential (primary) hypertension: Secondary | ICD-10-CM

## 2024-03-18 DIAGNOSIS — H35033 Hypertensive retinopathy, bilateral: Secondary | ICD-10-CM | POA: Diagnosis not present

## 2024-03-18 DIAGNOSIS — H43813 Vitreous degeneration, bilateral: Secondary | ICD-10-CM

## 2024-04-01 ENCOUNTER — Telehealth: Payer: Self-pay | Admitting: Family

## 2024-04-01 NOTE — Telephone Encounter (Signed)
 Pt will call back to get r/s from the 05/18/24 appt. -aw

## 2024-04-13 ENCOUNTER — Ambulatory Visit: Admitting: Family

## 2024-05-18 ENCOUNTER — Ambulatory Visit: Admitting: Family

## 2024-05-19 ENCOUNTER — Ambulatory Visit: Admitting: Family

## 2024-07-15 ENCOUNTER — Encounter (INDEPENDENT_AMBULATORY_CARE_PROVIDER_SITE_OTHER): Admitting: Ophthalmology

## 2024-09-21 ENCOUNTER — Ambulatory Visit
